# Patient Record
Sex: Female | Born: 1950
Health system: Southern US, Community
[De-identification: ages and names within clinical notes are randomized; demographics above are authoritative.]

## PROBLEM LIST (undated history)

## (undated) DIAGNOSIS — E785 Hyperlipidemia, unspecified: Secondary | ICD-10-CM

## (undated) DIAGNOSIS — M810 Age-related osteoporosis without current pathological fracture: Secondary | ICD-10-CM

## (undated) DIAGNOSIS — G2581 Restless legs syndrome: Secondary | ICD-10-CM

## (undated) DIAGNOSIS — K279 Peptic ulcer, site unspecified, unspecified as acute or chronic, without hemorrhage or perforation: Secondary | ICD-10-CM

## (undated) DIAGNOSIS — J309 Allergic rhinitis, unspecified: Secondary | ICD-10-CM

## (undated) DIAGNOSIS — I10 Essential (primary) hypertension: Secondary | ICD-10-CM

## (undated) DIAGNOSIS — E079 Disorder of thyroid, unspecified: Secondary | ICD-10-CM

## (undated) DIAGNOSIS — J449 Chronic obstructive pulmonary disease, unspecified: Secondary | ICD-10-CM

## (undated) DIAGNOSIS — R05 Cough: Secondary | ICD-10-CM

## (undated) DIAGNOSIS — J189 Pneumonia, unspecified organism: Secondary | ICD-10-CM

## (undated) DIAGNOSIS — R252 Cramp and spasm: Secondary | ICD-10-CM

## (undated) HISTORY — DX: Allergic rhinitis, unspecified: J30.9

## (undated) HISTORY — DX: Essential (primary) hypertension: I10

## (undated) HISTORY — DX: Hyperlipidemia, unspecified: E78.5

## (undated) HISTORY — PX: BREAST LUMPECTOMY: SHX2

## (undated) HISTORY — DX: Cough: R05

## (undated) HISTORY — DX: Chronic obstructive pulmonary disease, unspecified: J44.9

## (undated) HISTORY — DX: Disorder of thyroid, unspecified: E07.9

## (undated) HISTORY — DX: Cramp and spasm: R25.2

## (undated) HISTORY — DX: Age-related osteoporosis without current pathological fracture: M81.0

## (undated) HISTORY — PX: BREAST EXCISIONAL BIOPSY: SUR124

## (undated) HISTORY — DX: Pneumonia, unspecified organism: J18.9

## (undated) HISTORY — DX: Peptic ulcer, site unspecified, unspecified as acute or chronic, without hemorrhage or perforation: K27.9

## (undated) HISTORY — DX: Restless legs syndrome: G25.81

---
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
## 1999-03-08 ENCOUNTER — Encounter: Admission: RE | Admit: 1999-03-08 | Discharge: 1999-03-08 | Payer: Self-pay | Admitting: Obstetrics and Gynecology

## 1999-03-08 ENCOUNTER — Encounter: Payer: Self-pay | Admitting: Obstetrics and Gynecology

## 2000-08-05 ENCOUNTER — Encounter: Admission: RE | Admit: 2000-08-05 | Discharge: 2000-08-05 | Payer: Self-pay | Admitting: Obstetrics and Gynecology

## 2000-08-05 ENCOUNTER — Encounter: Payer: Self-pay | Admitting: Obstetrics and Gynecology

## 2001-11-23 ENCOUNTER — Encounter: Admission: RE | Admit: 2001-11-23 | Discharge: 2001-11-23 | Payer: Self-pay | Admitting: Obstetrics and Gynecology

## 2001-11-23 ENCOUNTER — Encounter: Payer: Self-pay | Admitting: Obstetrics and Gynecology

## 2003-01-11 ENCOUNTER — Emergency Department (HOSPITAL_COMMUNITY): Admission: EM | Admit: 2003-01-11 | Discharge: 2003-01-11 | Payer: Self-pay | Admitting: Emergency Medicine

## 2003-02-10 ENCOUNTER — Encounter: Payer: Self-pay | Admitting: Obstetrics and Gynecology

## 2003-02-10 ENCOUNTER — Encounter: Admission: RE | Admit: 2003-02-10 | Discharge: 2003-02-10 | Payer: Self-pay | Admitting: Obstetrics and Gynecology

## 2003-03-14 ENCOUNTER — Emergency Department (HOSPITAL_COMMUNITY): Admission: AD | Admit: 2003-03-14 | Discharge: 2003-03-14 | Payer: Self-pay | Admitting: Family Medicine

## 2004-02-27 ENCOUNTER — Encounter: Admission: RE | Admit: 2004-02-27 | Discharge: 2004-02-27 | Payer: Self-pay | Admitting: Internal Medicine

## 2004-02-27 ENCOUNTER — Ambulatory Visit: Payer: Self-pay | Admitting: Internal Medicine

## 2004-05-25 ENCOUNTER — Encounter: Payer: Self-pay | Admitting: Internal Medicine

## 2004-06-04 ENCOUNTER — Other Ambulatory Visit: Admission: RE | Admit: 2004-06-04 | Discharge: 2004-06-04 | Payer: Self-pay | Admitting: Internal Medicine

## 2004-06-04 ENCOUNTER — Ambulatory Visit: Payer: Self-pay | Admitting: Internal Medicine

## 2004-09-03 ENCOUNTER — Ambulatory Visit: Payer: Self-pay | Admitting: Internal Medicine

## 2004-12-11 ENCOUNTER — Emergency Department (HOSPITAL_COMMUNITY): Admission: EM | Admit: 2004-12-11 | Discharge: 2004-12-11 | Payer: Self-pay | Admitting: Emergency Medicine

## 2005-04-18 ENCOUNTER — Encounter: Admission: RE | Admit: 2005-04-18 | Discharge: 2005-04-18 | Payer: Self-pay | Admitting: Internal Medicine

## 2005-06-10 ENCOUNTER — Ambulatory Visit: Payer: Self-pay | Admitting: Internal Medicine

## 2005-06-13 ENCOUNTER — Ambulatory Visit: Payer: Self-pay | Admitting: Internal Medicine

## 2006-05-16 ENCOUNTER — Encounter: Admission: RE | Admit: 2006-05-16 | Discharge: 2006-05-16 | Payer: Self-pay | Admitting: Internal Medicine

## 2006-05-29 ENCOUNTER — Encounter: Admission: RE | Admit: 2006-05-29 | Discharge: 2006-05-29 | Payer: Self-pay | Admitting: Internal Medicine

## 2006-06-12 ENCOUNTER — Ambulatory Visit: Payer: Self-pay | Admitting: Internal Medicine

## 2006-06-12 LAB — CONVERTED CEMR LAB
AST: 27 units/L (ref 0–37)
Albumin: 4.2 g/dL (ref 3.5–5.2)
Alkaline Phosphatase: 51 units/L (ref 39–117)
Basophils Absolute: 0 10*3/uL (ref 0.0–0.1)
Basophils Relative: 0.2 % (ref 0.0–1.0)
Creatinine, Ser: 1.3 mg/dL — ABNORMAL HIGH (ref 0.4–1.2)
Crystals: NEGATIVE
Eosinophils Relative: 5.1 % — ABNORMAL HIGH (ref 0.0–5.0)
GFR calc Af Amer: 55 mL/min
GFR calc non Af Amer: 45 mL/min
Glucose, Bld: 92 mg/dL (ref 70–99)
HCT: 41.9 % (ref 36.0–46.0)
HDL: 63.6 mg/dL (ref >39.0)
Hemoglobin: 14.5 g/dL (ref 12.0–15.0)
Ketones, ur: NEGATIVE mg/dL
Lymphocytes Relative: 37.1 % (ref 12.0–46.0)
Neutro Abs: 1.7 10*3/uL (ref 1.4–7.7)
Neutrophils Relative %: 50.4 % (ref 43.0–77.0)
Platelets: 290 10*3/uL (ref 150–400)
Potassium: 3.8 meq/L (ref 3.5–5.1)
RDW: 12 % (ref 11.5–14.6)
Sodium: 140 meq/L (ref 135–145)
TSH: 1.05 microintl units/mL (ref 0.35–5.50)
Total CHOL/HDL Ratio: 3.1
Total Protein: 8.1 g/dL (ref 6.0–8.3)
Triglycerides: 81 mg/dL (ref 0–149)
VLDL: 16 mg/dL (ref 0–40)
WBC: 3.5 10*3/uL — ABNORMAL LOW (ref 4.5–10.5)
pH: 6 (ref 5.0–8.0)

## 2006-06-19 ENCOUNTER — Emergency Department (HOSPITAL_COMMUNITY): Admission: EM | Admit: 2006-06-19 | Discharge: 2006-06-19 | Payer: Self-pay | Admitting: Family Medicine

## 2006-06-20 ENCOUNTER — Ambulatory Visit: Payer: Self-pay | Admitting: Internal Medicine

## 2006-06-25 ENCOUNTER — Ambulatory Visit: Payer: Self-pay | Admitting: Internal Medicine

## 2006-08-04 ENCOUNTER — Ambulatory Visit: Payer: Self-pay | Admitting: Internal Medicine

## 2006-08-18 ENCOUNTER — Ambulatory Visit: Payer: Self-pay | Admitting: Internal Medicine

## 2006-08-18 LAB — HM COLONOSCOPY

## 2007-02-04 ENCOUNTER — Telehealth: Payer: Self-pay | Admitting: Internal Medicine

## 2007-02-10 ENCOUNTER — Encounter: Payer: Self-pay | Admitting: Internal Medicine

## 2007-02-10 DIAGNOSIS — M810 Age-related osteoporosis without current pathological fracture: Secondary | ICD-10-CM

## 2007-02-10 DIAGNOSIS — I10 Essential (primary) hypertension: Secondary | ICD-10-CM | POA: Insufficient documentation

## 2007-02-10 HISTORY — DX: Age-related osteoporosis without current pathological fracture: M81.0

## 2007-02-10 HISTORY — DX: Essential (primary) hypertension: I10

## 2007-06-02 ENCOUNTER — Encounter: Admission: RE | Admit: 2007-06-02 | Discharge: 2007-06-02 | Payer: Self-pay | Admitting: Internal Medicine

## 2007-06-25 ENCOUNTER — Telehealth: Payer: Self-pay | Admitting: Internal Medicine

## 2007-06-26 ENCOUNTER — Encounter: Payer: Self-pay | Admitting: Internal Medicine

## 2007-07-13 ENCOUNTER — Telehealth: Payer: Self-pay | Admitting: Internal Medicine

## 2007-09-04 ENCOUNTER — Telehealth (INDEPENDENT_AMBULATORY_CARE_PROVIDER_SITE_OTHER): Payer: Self-pay | Admitting: *Deleted

## 2007-09-20 ENCOUNTER — Emergency Department (HOSPITAL_COMMUNITY): Admission: EM | Admit: 2007-09-20 | Discharge: 2007-09-20 | Payer: Self-pay | Admitting: Family Medicine

## 2007-11-05 ENCOUNTER — Ambulatory Visit: Payer: Self-pay | Admitting: Endocrinology

## 2007-11-05 ENCOUNTER — Telehealth (INDEPENDENT_AMBULATORY_CARE_PROVIDER_SITE_OTHER): Payer: Self-pay | Admitting: *Deleted

## 2007-11-05 DIAGNOSIS — R059 Cough, unspecified: Secondary | ICD-10-CM

## 2007-11-05 DIAGNOSIS — R05 Cough: Secondary | ICD-10-CM

## 2007-11-05 HISTORY — DX: Cough, unspecified: R05.9

## 2007-11-06 LAB — CONVERTED CEMR LAB
Creatinine, Ser: 1.2 mg/dL (ref 0.4–1.2)
Glucose, Bld: 96 mg/dL (ref 70–99)
Potassium: 3.2 meq/L — ABNORMAL LOW (ref 3.5–5.1)

## 2007-11-16 ENCOUNTER — Ambulatory Visit: Payer: Self-pay | Admitting: Cardiology

## 2007-11-16 DIAGNOSIS — E079 Disorder of thyroid, unspecified: Secondary | ICD-10-CM | POA: Insufficient documentation

## 2007-11-16 HISTORY — DX: Disorder of thyroid, unspecified: E07.9

## 2007-11-24 ENCOUNTER — Encounter: Admission: RE | Admit: 2007-11-24 | Discharge: 2007-11-24 | Payer: Self-pay | Admitting: Endocrinology

## 2008-03-14 ENCOUNTER — Ambulatory Visit: Payer: Self-pay | Admitting: Internal Medicine

## 2008-03-14 ENCOUNTER — Ambulatory Visit: Payer: Self-pay | Admitting: Cardiovascular Disease

## 2008-03-14 ENCOUNTER — Inpatient Hospital Stay (HOSPITAL_COMMUNITY): Admission: EM | Admit: 2008-03-14 | Discharge: 2008-03-16 | Payer: Self-pay | Admitting: Emergency Medicine

## 2008-03-15 ENCOUNTER — Encounter: Payer: Self-pay | Admitting: Internal Medicine

## 2008-03-23 ENCOUNTER — Ambulatory Visit: Payer: Self-pay | Admitting: Internal Medicine

## 2008-03-23 ENCOUNTER — Telehealth (INDEPENDENT_AMBULATORY_CARE_PROVIDER_SITE_OTHER): Payer: Self-pay | Admitting: *Deleted

## 2008-03-23 DIAGNOSIS — K279 Peptic ulcer, site unspecified, unspecified as acute or chronic, without hemorrhage or perforation: Secondary | ICD-10-CM

## 2008-03-23 DIAGNOSIS — G2581 Restless legs syndrome: Secondary | ICD-10-CM

## 2008-03-23 DIAGNOSIS — J189 Pneumonia, unspecified organism: Secondary | ICD-10-CM

## 2008-03-23 DIAGNOSIS — J449 Chronic obstructive pulmonary disease, unspecified: Secondary | ICD-10-CM

## 2008-03-23 DIAGNOSIS — J4489 Other specified chronic obstructive pulmonary disease: Secondary | ICD-10-CM

## 2008-03-23 HISTORY — DX: Other specified chronic obstructive pulmonary disease: J44.89

## 2008-03-23 HISTORY — DX: Chronic obstructive pulmonary disease, unspecified: J44.9

## 2008-03-23 HISTORY — DX: Pneumonia, unspecified organism: J18.9

## 2008-03-23 HISTORY — DX: Peptic ulcer, site unspecified, unspecified as acute or chronic, without hemorrhage or perforation: K27.9

## 2008-03-23 HISTORY — DX: Restless legs syndrome: G25.81

## 2008-04-12 ENCOUNTER — Telehealth: Payer: Self-pay | Admitting: Internal Medicine

## 2008-06-02 ENCOUNTER — Encounter: Admission: RE | Admit: 2008-06-02 | Discharge: 2008-06-02 | Payer: Self-pay | Admitting: Internal Medicine

## 2008-07-01 ENCOUNTER — Telehealth: Payer: Self-pay | Admitting: Internal Medicine

## 2008-07-21 ENCOUNTER — Encounter: Payer: Self-pay | Admitting: Internal Medicine

## 2008-07-21 ENCOUNTER — Ambulatory Visit: Payer: Self-pay | Admitting: Internal Medicine

## 2008-11-02 ENCOUNTER — Ambulatory Visit: Payer: Self-pay | Admitting: Internal Medicine

## 2008-12-14 ENCOUNTER — Telehealth: Payer: Self-pay | Admitting: Internal Medicine

## 2008-12-15 ENCOUNTER — Telehealth: Payer: Self-pay | Admitting: Internal Medicine

## 2009-06-08 ENCOUNTER — Encounter: Admission: RE | Admit: 2009-06-08 | Discharge: 2009-06-08 | Payer: Self-pay | Admitting: Internal Medicine

## 2009-06-08 LAB — HM MAMMOGRAPHY

## 2009-08-09 ENCOUNTER — Ambulatory Visit: Payer: Self-pay | Admitting: Internal Medicine

## 2009-08-09 DIAGNOSIS — R252 Cramp and spasm: Secondary | ICD-10-CM

## 2009-08-09 HISTORY — DX: Cramp and spasm: R25.2

## 2009-08-09 LAB — CONVERTED CEMR LAB
ALT: 16 units/L (ref 0–35)
BUN: 37 mg/dL — ABNORMAL HIGH (ref 6–23)
Basophils Absolute: 0 10*3/uL (ref 0.0–0.1)
Basophils Relative: 1.4 % (ref 0.0–3.0)
Creatinine, Ser: 1.3 mg/dL — ABNORMAL HIGH (ref 0.4–1.2)
Direct LDL: 113 mg/dL
GFR calc non Af Amer: 54.11 mL/min (ref >60)
HCT: 36.6 % (ref 36.0–46.0)
Lymphs Abs: 0.7 10*3/uL (ref 0.7–4.0)
MCHC: 35 g/dL (ref 30.0–36.0)
Magnesium: 1.9 mg/dL (ref 1.5–2.5)
Monocytes Relative: 11 % (ref 3.0–12.0)
Neutrophils Relative %: 53.5 % (ref 43.0–77.0)
Nitrite: NEGATIVE
Platelets: 285 10*3/uL (ref 150.0–400.0)
Potassium: 4.5 meq/L (ref 3.5–5.1)
Sodium: 140 meq/L (ref 135–145)
TSH: 0.82 microintl units/mL (ref 0.35–5.50)
Total Bilirubin: 0.8 mg/dL (ref 0.3–1.2)
Total Protein, Urine: NEGATIVE mg/dL
Urine Glucose: NEGATIVE mg/dL
VLDL: 11.8 mg/dL (ref 0.0–40.0)
WBC: 2.7 10*3/uL — ABNORMAL LOW (ref 4.5–10.5)
pH: 5.5 (ref 5.0–8.0)

## 2009-11-15 ENCOUNTER — Emergency Department (HOSPITAL_COMMUNITY): Admission: EM | Admit: 2009-11-15 | Discharge: 2009-11-15 | Payer: Self-pay | Admitting: Emergency Medicine

## 2010-05-12 ENCOUNTER — Other Ambulatory Visit: Payer: Self-pay | Admitting: Internal Medicine

## 2010-05-12 DIAGNOSIS — Z1239 Encounter for other screening for malignant neoplasm of breast: Secondary | ICD-10-CM

## 2010-05-13 ENCOUNTER — Encounter: Payer: Self-pay | Admitting: Internal Medicine

## 2010-05-14 ENCOUNTER — Encounter: Payer: Self-pay | Admitting: Endocrinology

## 2010-05-22 NOTE — Assessment & Plan Note (Signed)
Summary: CRAMPS---STC   Vital Signs:  Patient profile:   60 year old female Height:      65 inches Weight:      111 pounds BMI:     18.54 O2 Sat:      97 % on Room air Temp:     97.6 degrees F oral Pulse rate:   81 / minute BP sitting:   120 / 84  (left arm) Cuff size:   regular  Vitals Entered ByZella Ball Ewing (August 09, 2009 10:27 AM)  O2 Flow:  Room air  Preventive Care Screening  Bone Density:    Date:  06/29/2009    Next Due:  06/2011    Results:  abnormal std dev  Mammogram:    Next Due:  06/2010  Last Flu Shot:    Date:  01/20/2009    Results:  given      declines shots for today  CC: Leg cramps/RE   CC:  Leg cramps/RE.  History of Present Illness: pt here to f/u; incidently on doxy per dental due to gum dz;  has very active physical job with cramps to post legs as well as arms and mild tenderness to the elbows;  also requests increase in the advair strength due to several weeks breakthrough symptoms or mild wheezing/sob;  Pt denies CP, orthopnea, pnd, worsening LE edema, palps, dizziness or syncope .  Pt denies new neuro symptoms such as headache, facial or extremity weakness   Preventive Screening-Counseling & Management  Alcohol-Tobacco     Smoking Status: quit      Drug Use:  no.    Problems Prior to Update: 1)  Preventive Health Care  (ICD-V70.0) 2)  Muscle Cramps  (ICD-729.82) 3)  Peptic Ulcer Disease  (ICD-533.90) 4)  Restless Leg Syndrome  (ICD-333.94) 5)  COPD  (ICD-496) 6)  Pneumonia, Right Upper Lobe  (ICD-486) 7)  Unspecified Disorder of Thyroid  (ICD-246.9) 8)  Encounter For Long-term Use of Other Medications  (ICD-V58.69) 9)  Cough  (ICD-786.2) 10)  Osteoporosis  (ICD-733.00) 11)  Hypertension  (ICD-401.9)  Medications Prior to Update: 1)  Triamterene-Hctz 37.5-25 Mg  Caps (Triamterene-Hctz) .Marland Kitchen.. 1 By Mouth Once Daily ----- [facility] Member # [id number] 2)  Tylenol Extra Strength 500 Mg  Tabs (Acetaminophen) .... As Needed 3)   Fosamax Plus D 70-2800 Mg-Unit  Tabs (Alendronate-Cholecalciferol) .Marland Kitchen.. 1 By Mouth Q Wk 4)  Ventolin Hfa 108 (90 Base) Mcg/act Aers (Albuterol Sulfate) 5)  Advair Diskus 100-50 Mcg/dose Misc (Fluticasone-Salmeterol) .Marland Kitchen.. 1 Puff Two Times A Day  Current Medications (verified): 1)  Triamterene-Hctz 37.5-25 Mg  Caps (Triamterene-Hctz) .Marland Kitchen.. 1 By Mouth Once Daily ----- [facility] Member # [id number] 2)  Tylenol Extra Strength 500 Mg  Tabs (Acetaminophen) .... As Needed 3)  Fosamax Plus D 70-2800 Mg-Unit  Tabs (Alendronate-Cholecalciferol) .Marland Kitchen.. 1 By Mouth Q Wk 4)  Ventolin Hfa 108 (90 Base) Mcg/act Aers (Albuterol Sulfate) .... 2 Puffs Qid As Needed 5)  Advair Diskus 250-50 Mcg/dose Aepb (Fluticasone-Salmeterol) .Marland Kitchen.. 1 Puff Two Times A Day  Allergies (verified): 1)  ! Zoloft  Past History:  Past Medical History: Last updated: 2008/04/21 OSTEOPOROSIS (ICD-733.00) - with early menopause at 60yo, on HRT for 10 yrs to 2004 HYPERTENSION (ICD-401.9) COPD RLS Peptic ulcer disease s/p knee fracture 1998  Past Surgical History: Last updated: 04/21/08 Breast bx (negative) - 1972  Family History: Last updated: 04/21/2008 brother died with throat cancer brother died with renal failure father with leukemia mother with MI at 59yo/smoker  Social History: Last updated: 08/09/2009 Alcohol use-yes Married 2 children work - Public affairs consultant Former Smoker - since 2009 Drug use-no  Risk Factors: Smoking Status: quit (08/09/2009)  Family History: Reviewed history from 03/23/2008 and no changes required. brother died with throat cancer brother died with renal failure father with leukemia mother with MI at 59yo/smoker  Social History: Reviewed history from 03/23/2008 and no changes required. 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         all otherwise negative per pt -    Physical Exam  General:  alert and underweight appearing.   Head:  normocephalic and atraumatic.   Eyes:  vision grossly intact, pupils equal, and pupils round.   Ears:  R ear normal and L ear normal.   Nose:  no external deformity and no nasal discharge.   Mouth:  no gingival abnormalities and pharynx pink and moist.   Neck:  supple and no masses.   Lungs:  normal respiratory effort and normal breath sounds.   Heart:  normal rate and regular rhythm.   Abdomen:  soft, non-tender, and normal bowel sounds.   Msk:  no joint tenderness and no joint swelling except for bilat lateral epicondylar areas; no muscular tender to palpation such as thigh or calves Extremities:  no edema, no erythema  Neurologic:  cranial nerves II-XII intact and strength normal in all extremities.   Skin:  color normal and no rashes.     Impression & Recommendations:  Problem # 1:  Preventive Health Care (ICD-V70.0)  Overall doing well, age appropriate education and counseling updated and referral for appropriate preventive services done unless declined, immunizations up to date or declined, diet counseling done if overweight, urged to quit smoking if smokes , most recent labs reviewed and current ordered if appropriate, ecg reviewed or declined (interpretation per ECG scanned in the EMR if done); information regarding Medicare Prevention requirements given if appropriate   Orders: T-Vitamin D (25-Hydroxy) (46962-95284) TLB-BMP (Basic Metabolic Panel-BMET) (80048-METABOL) TLB-CBC  Platelet - w/Differential (85025-CBCD) TLB-Hepatic/Liver Function Pnl (80076-HEPATIC) TLB-Lipid Panel (80061-LIPID) TLB-TSH (Thyroid Stimulating Hormone) (84443-TSH) TLB-Udip ONLY (81003-UDIP)  Problem # 2:  OSTEOPOROSIS (ICD-733.00)  Her updated medication list for this problem includes:    Fosamax Plus D 70-2800 Mg-unit Tabs (Alendronate-cholecalciferol) .Marland Kitchen... 1 by mouth q wk stable overall by hx and exam, ok to continue meds/tx as is , most recent dxa d/w pt  Problem # 3:  COPD (ICD-496)  Her updated medication list for this problem includes:    Ventolin Hfa 108 (90 Base) Mcg/act Aers (Albuterol sulfate) .Marland Kitchen... 2 puffs qid as needed    Advair Diskus 250-50 Mcg/dose Aepb (Fluticasone-salmeterol) .Marland Kitchen... 1 puff two times a day meds increased as above;  f/u any worsening symptoms , urged to quit smoking  Problem # 4:  HYPERTENSION (ICD-401.9)  Her updated medication list for this problem includes:    Triamterene-hctz 37.5-25 Mg Caps (Triamterene-hctz) .Marland Kitchen... 1 by mouth once daily ----- [facility] member # [id number]  BP today: 120/84 Prior BP: 132/88 (11/02/2008)  Labs Reviewed: K+: 3.2 (11/05/2007) Creat: : 1.2 (11/05/2007)  Chol: 194 (06/12/2006)   HDL: 63.6 (06/12/2006)   LDL: 114 (06/12/2006)   TG: 81 (06/12/2006) stable overall by hx and exam, ok to continue meds/tx as is   Problem # 5:  MUSCLE CRAMPS (ICD-729.82)  likely due to overexertion - try B complex MVI, and check Mg in addition to labs above  Orders: TLB-Magnesium (Mg) (83735-MG)  Complete Medication List: 1)  Triamterene-hctz 37.5-25 Mg Caps (Triamterene-hctz) .Marland Kitchen.. 1 by mouth once daily ----- [facility] member # [id number] 2)  Tylenol Extra Strength 500 Mg Tabs (Acetaminophen) .... As needed 3)  Fosamax Plus D 70-2800 Mg-unit Tabs (Alendronate-cholecalciferol) .Marland Kitchen.. 1 by mouth q wk 4)  Ventolin Hfa 108 (90 Base) Mcg/act Aers (Albuterol sulfate) .... 2 puffs qid as needed 5)  Advair Diskus 250-50 Mcg/dose Aepb  (Fluticasone-salmeterol) .Marland Kitchen.. 1 puff two times a day  Other Orders: Pneumococcal Vaccine (78295) Admin 1st Vaccine (62130)  Patient Instructions: 1)  consider B complex mutlivitiamin - 1 per day for the cramps 2)  your advair was increased to the 250/50 today 3)  you had the pneumonia shot today 4)  Please go to the Lab in the basement for your blood and/or urine tests today 5)  Continue all previous medications as before this visit  6)  Please schedule a follow-up appointment in 1 year or sooner if needed Prescriptions: VENTOLIN HFA 108 (90 BASE) MCG/ACT AERS (ALBUTEROL SULFATE) 2 puffs qid as needed  #1 x 11   Entered and Authorized by:   Corwin Levins MD   Signed by:   Corwin Levins MD on 08/09/2009   Method used:   Print then Give to Patient   RxID:   [id number] ADVAIR DISKUS 250-50 MCG/DOSE AEPB (FLUTICASONE-SALMETEROL) 1 puff two times a day  #3 x 3   Entered and Authorized by:   Corwin Levins MD   Signed by:   Corwin Levins MD on 08/09/2009   Method used:   Print then Give to Patient   RxID:   [id number] FOSAMAX PLUS D 70-2800 MG-UNIT  TABS (ALENDRONATE-CHOLECALCIFEROL) 1 by mouth q wk  #12 x 3   Entered and Authorized by:   Corwin Levins MD   Signed by:   Corwin Levins MD on 08/09/2009   Method used:   Print then Give to Patient   RxID:   [id number] TRIAMTERENE-HCTZ 37.5-25 MG  CAPS (TRIAMTERENE-HCTZ) 1 by mouth once daily ----- [facility] MEMBER # [id number]  #90 x 3   Entered and Authorized by:   Corwin Levins MD   Signed by:   Corwin Levins MD on 08/09/2009   Method used:   Print then Give to Patient   RxID:   [id number] ADVAIR DISKUS 250-50 MCG/DOSE AEPB (FLUTICASONE-SALMETEROL) 1 puff two times a day  #1 x 11   Entered and Authorized by:   Corwin Levins MD   Signed by:   Corwin Levins MD on 08/09/2009   Method used:   Electronically to        [facility]  [phone]* (retail)       [address]       [place],  [place]  [id number]       Ph: [phone] or [phone]       Fax: [phone]   RxID:   [id number]    Immunizations Administered:  Pneumonia Vaccine:    Vaccine Type: Pneumovax    Site: left  deltoid    Mfr: Merck    Dose: 0.5 ml    Route: IM    Given by: Robin Ewing    Exp. Date: 12/02/2010    Lot #: [id number]    VIS given: 11/18/95 version given August 09, 2009.

## 2010-06-12 ENCOUNTER — Ambulatory Visit
Admission: RE | Admit: 2010-06-12 | Discharge: 2010-06-12 | Disposition: A | Discharge status: Home / Self Care | Payer: 59 | Source: Ambulatory Visit | Attending: Internal Medicine | Admitting: Internal Medicine

## 2010-06-12 DIAGNOSIS — Z1239 Encounter for other screening for malignant neoplasm of breast: Secondary | ICD-10-CM

## 2010-07-07 LAB — CBC
HCT: 40.6 % (ref 36.0–46.0)
MCHC: 34.8 g/dL (ref 30.0–36.0)
Platelets: 290 10*3/uL (ref 150–400)
WBC: 3.6 10*3/uL — ABNORMAL LOW (ref 4.0–10.5)

## 2010-07-07 LAB — COMPREHENSIVE METABOLIC PANEL
ALT: 17 U/L (ref 0–35)
BUN: 30 mg/dL — ABNORMAL HIGH (ref 6–23)
CO2: 33 mEq/L — ABNORMAL HIGH (ref 19–32)
GFR calc Af Amer: 49 mL/min — ABNORMAL LOW (ref >60)
GFR calc non Af Amer: 40 mL/min — ABNORMAL LOW (ref >60)
Glucose, Bld: 113 mg/dL — ABNORMAL HIGH (ref 70–99)
Total Protein: 8.9 g/dL — ABNORMAL HIGH (ref 6.0–8.3)

## 2010-07-07 LAB — URINE MICROSCOPIC-ADD ON

## 2010-07-07 LAB — DIFFERENTIAL
Basophils Absolute: 0 10*3/uL (ref 0.0–0.1)
Basophils Relative: 1 % (ref 0–1)
Eosinophils Absolute: 0 10*3/uL (ref 0.0–0.7)
Monocytes Absolute: 0.3 10*3/uL (ref 0.1–1.0)
Neutro Abs: 2.4 10*3/uL (ref 1.7–7.7)
Neutrophils Relative %: 65 % (ref 43–77)

## 2010-07-07 LAB — URINALYSIS, ROUTINE W REFLEX MICROSCOPIC
Glucose, UA: NEGATIVE mg/dL
Hgb urine dipstick: NEGATIVE
Nitrite: NEGATIVE
Protein, ur: NEGATIVE mg/dL
Specific Gravity, Urine: 1.018 (ref 1.005–1.030)

## 2010-07-07 LAB — LIPASE, BLOOD: Lipase: 34 U/L (ref 11–59)

## 2010-07-07 LAB — URINE CULTURE

## 2010-09-04 NOTE — Discharge Summary (Signed)
[name], [name]              ACCOUNT NO.:  [id number]   MEDICAL RECORD NO.:  [id number]          PATIENT TYPE:  INP   LOCATION:  2007                         FACILITY:  [facility]   PHYSICIAN:  Raenette Rover. Felicity Coyer, MDDATE OF BIRTH:  13-Mar-1951   DATE OF ADMISSION:  03/14/2008  DATE OF DISCHARGE:  03/16/2008                               DISCHARGE SUMMARY   DISCHARGE DIAGNOSES:  1. Acute chronic obstructive pulmonary disease exacerbation.  2. Hypertension.  3. Hypokalemia.  4. Ongoing tobacco abuse.  5. Elevated AST on admission, resolved.  6. Restless legs syndrome.   HISTORY OF PRESENT ILLNESS:  Andrea [name] is a 60 year old female who was  admitted on March 14, 2008, with a chief complaint of shortness of  breath.  She has a past medical history of tobacco abuse and  hypertension.  She was at her baseline until the Wednesday prior to  admission, at which time she developed chills and myalgias as well as  increased shortness of breath and cough.  Her symptoms continued to  worsen and she began to wheeze.  She was admitted for further evaluation  and treatment.   PAST MEDICAL HISTORY:  1. Osteoporosis.  2. Hypertension.   COURSE OF HOSPITALIZATION:  1. Acute COPD exacerbation.  The patient is currently smoking two      packs of cigarettes per day.  She underwent a chest x-ray on      admission, which showed emphysematous changes.  It did not show any      acute infiltrate.  She was placed on empiric Avelox and oxygen.      Her cardiac enzymes were cycled and were within normal limits.  She      was also given nebulizer treatment.  She is currently stable and      improved.  She continues with dry cough and will be given a      prescription for Tussionex at the time of discharge.  She is      motivated, quit smoking, and wishes to use Chantix.  A prescription      will be provided at the time of discharge.  She did have some brief      hypokalemia, which was repleted.  Her  blood pressure medications      have been held.  Her blood pressure was on the low side, today it      is 114/70.  We will continue to hold her blood pressure medicine      until followup with Dr. Jonny Ruiz early next week.  Also, of note, she      had a mildly positive urinalysis.  Plan to continue fluoroquinolone      for COPD exacerbation, which should also cover any underlying      urinary tract infection.  Her BMP was normal and her 2D echo      performed on this admission showed a normal left ventricular      ejection fraction of 60%.  2. Restless legs syndrome.  The patient reports a history of many      years of restless legs and  insomnia.  Requested to try something to      see if it would help.  She was given ReQuip last night with good      result.  This would be continued at the time of discharge.   DISCHARGE MEDICATIONS:  1. Tylenol 500 mg p.o. q.8 h. p.r.n.  2. Fosamax plus D 70/280 one tab p.o. once weekly.  3. Albuterol MDI two puffs every 6 hours as needed.  4. Triamterene/hydrochlorothiazide 37.5/25 p.o. daily, to be held      until followup with Dr. Jonny Ruiz.  5. Tussionex 5 mL p.o. q.12 h. as needed.  6. ReQuip 1 mg p.o. nightly.  7. Avelox 400 mg p.o. daily for 5 days.  8. Chantix 0.5 mg tabs one tab p.o. daily for three days and one tab      p.o. b.i.d.   PERTINENT LABORATORY DATA:  At the time of discharge, cardiac enzymes  negative.  ESR 42, BUN 18, and creatinine 1.32.  Blood cultures remain  no growth-to-date x2.  Potassium 3.3 prior to repletion.   FOLLOWUP:  The patient is scheduled to follow up with Dr. Oliver Barre on  March 23, 2008, at 9 o'clock a.m.  She is instructed to call Dr. Jonny Ruiz  should she develop fever greater than 101, increased weakness, or  increased shortness of breath.  She is instructed to go to the ER if  severe.  Greater than 30 minutes was spent on discharge planning.      Sandford Craze, NP      Raenette Rover. Felicity Coyer, MD   Electronically Signed    MO/MEDQ  D:  03/16/2008  T:  03/16/2008  Job:  [id number]   cc:   Corwin Levins, MD

## 2010-09-04 NOTE — H&P (Signed)
NAMEJACKELIN, Andrea [name]              ACCOUNT NO.:  [id number]   MEDICAL RECORD NO.:  [id number]          PATIENT TYPE:  INP   LOCATION:  2007                         FACILITY:  [facility]   PHYSICIAN:  Michiel Cowboy, MDDATE OF BIRTH:  [birth year]   DATE OF ADMISSION:  03/14/2008  DATE OF DISCHARGE:                              HISTORY & PHYSICAL   PRIMARY CARE PHYSICIAN:  Corwin Levins, MD   CHIEF COMPLAINT:  Shortness of breath.   HISTORY OF PRESENT ILLNESS:  The patient is a 60 year old female with  history of hypothyroidism and tobacco abuse and hypertension who was at  her baseline up until Wednesday when she developed chills, myalgias and  started to feel some shortness of breath with coughing.  Nonproductive  and mild headache.  She continued to worsen, started to wheeze which is  not regular for her.  She never carried a diagnosis of COPD and  emphysema despite extensive smoking history.  The patient progressively  continued to be short of breath and presented to the emergency  department where a chest x-ray was obtained showing COPD.  The patient  received repeated nebulizer treatments with mild improvement in  shortness of breath, but still was sating 92% on 2 liters which is a new  for her at which point [facility] hospitalist was called to admission for  [place].   REVIEW OF SYSTEMS:  As per HPI, otherwise no nausea and vomiting.  Endorses 4 pound weight loss.  The patient was having some chest pain  with coughing, but otherwise, has resolved.   Of note, the patient had received a flu shot in October.   PAST MEDICAL HISTORY:  Significant for history of osteoporosis and  hypertension.   SOCIAL HISTORY:  The patient smokes two packs a day.  Does not use  drugs.  Endorses 4 pounds weight loss for the past few months.   FAMILY HISTORY:  Noncontributory.   PHYSICAL EXAMINATION:  VITALS:  Temperature 98.5, blood pressure 127/88  pulse 102, respirations 24, sating 92% on room  air.  The patient appears  to be in no acute distress, laying down in bed.  HEAD:  Nontraumatic.  Dry mucous membranes, somewhat decreased skin  turgor.  LUNGS:  Diffuse coarse breath sounds with occasional wheezes.  ABDOMEN:  Soft, nontender, nondistended, scaphoid.  HEART:  Rapid but regular.  No murmurs, rubs or gallops.  LOWER EXTREMITIES: Without clubbing, cyanosis or edema.  NEUROLOGICAL:  Exam nonfocal.  Able to move all four extremities without  difficulty.   LABORATORY DATA:  Labs white blood cell count 9.1, hemoglobin 13.9,  sodium 443, potassium 3.3, creatinine 0.124.  LFTs show a slight  elevation of AST up to 40, otherwise unremarkable.  UA shows 3-6 white  blood cells.  D-dimer unremarkable.  ABGs:  A pH of 7.512, pCO2 of 30.  PO2 of 142, bicarb 29.   Chest x-ray significant for COPD.  Otherwise, no abnormalities noted.  EKG showing heart rate of 88, normal sinus rhythm with right axis  deviation.  No ST elevation or depression noted.  There is a P-wave  inversion in lead I.   ASSESSMENT AND PLAN:  This is a 60 year old female with a history of  hypertension who presents with shortness of breath and wheezing.  She  has extensive smoking history.  1. Shortness of breath and wheezing.  I wonder if this is COPD      exacerbation, although, she does not carry a diagnosis of COPD.      The differential could include a CHF exacerbation, again less      likely, will check a BNP level though.  For completion's sake,      would include a 2-D echo.  For now will treat as a COPD with      putting her on Avelox given the albuterol and Atrovent scheduled      nebulizers.  Will do prednisone taper, encourage her to stop      smoking.  The patient may need to be started on Advair as an      outpatient.  Will need probably PFTs performed once she is more      stable.  PE less likely given negative D-dimer.  2. Hypokalemia.  Will replace.  3. Elevated AST.  Will follow.  If continues  to be elevated may need      to have further investigation such as right upper quadrant      ultrasound.  4. Clinical mild dehydration.  Will give IV fluids carefully.  5. Prophylaxis.  Protonix and Lovenox.  6. History of hypertension.  Will hold hydrochlorothiazide while      appears to be clinically somewhat dry.  7. History of recent febrile illness.  Could be viral in etiology      given this is the flu season.  Will continue droplet precautions,      check influenza swabs.  Will obtain blood culture.  Consider      repeating chest x-ray after IV fluids were given to rule out an      early pneumonia.   Dr. Rene Paci to assume care in the a.m.      Michiel Cowboy, MD  Electronically Signed     AVD/MEDQ  D:  03/14/2008  T:  03/15/2008  Job:  [id number]   cc:   Corwin Levins, MD  Raenette Rover Felicity Coyer, MD

## 2010-09-25 ENCOUNTER — Other Ambulatory Visit: Payer: Self-pay | Admitting: Internal Medicine

## 2010-11-07 ENCOUNTER — Other Ambulatory Visit: Payer: Self-pay | Admitting: Internal Medicine

## 2010-11-08 ENCOUNTER — Other Ambulatory Visit: Payer: Self-pay | Admitting: *Deleted

## 2010-11-08 MED ORDER — TRIAMTERENE-HCTZ 37.5-25 MG PO TABS: 1.0000 | ORAL_TABLET | Freq: Every day | ORAL | Status: DC

## 2010-11-08 NOTE — Progress Notes (Signed)
Addended by: Scharlene Gloss B on: 11/08/2010 11:57 AM   Modules accepted: Orders

## 2010-11-08 NOTE — Progress Notes (Signed)
Pt informed if OV 12/13/10 is canceled/no show that No future refill authorizations will be given.

## 2010-12-09 ENCOUNTER — Encounter: Payer: Self-pay | Admitting: Internal Medicine

## 2010-12-09 DIAGNOSIS — Z0001 Encounter for general adult medical examination with abnormal findings: Secondary | ICD-10-CM | POA: Insufficient documentation

## 2010-12-09 DIAGNOSIS — Z0000 Encounter for general adult medical examination without abnormal findings: Secondary | ICD-10-CM | POA: Insufficient documentation

## 2010-12-13 ENCOUNTER — Encounter: Payer: Self-pay | Admitting: Internal Medicine

## 2010-12-13 ENCOUNTER — Ambulatory Visit (INDEPENDENT_AMBULATORY_CARE_PROVIDER_SITE_OTHER): Payer: 59 | Admitting: Internal Medicine

## 2010-12-13 ENCOUNTER — Other Ambulatory Visit (INDEPENDENT_AMBULATORY_CARE_PROVIDER_SITE_OTHER): Payer: 59

## 2010-12-13 VITALS — BP 110/80 | HR 84 | Temp 97.7°F | Ht 65.0 in | Wt 112.4 lb

## 2010-12-13 DIAGNOSIS — Z0000 Encounter for general adult medical examination without abnormal findings: Secondary | ICD-10-CM

## 2010-12-13 LAB — URINALYSIS, ROUTINE W REFLEX MICROSCOPIC
Bilirubin Urine: NEGATIVE
Hgb urine dipstick: NEGATIVE
Total Protein, Urine: NEGATIVE
Urine Glucose: NEGATIVE

## 2010-12-13 LAB — LIPID PANEL
HDL: 81.1 mg/dL (ref >39.00)
Total CHOL/HDL Ratio: 2
Triglycerides: 77 mg/dL (ref 0.0–149.0)
VLDL: 15.4 mg/dL (ref 0.0–40.0)

## 2010-12-13 LAB — HEPATIC FUNCTION PANEL
Albumin: 4.4 g/dL (ref 3.5–5.2)
Alkaline Phosphatase: 51 U/L (ref 39–117)
Total Protein: 8.1 g/dL (ref 6.0–8.3)

## 2010-12-13 LAB — CBC WITH DIFFERENTIAL/PLATELET
Basophils Absolute: 0 10*3/uL (ref 0.0–0.1)
Basophils Relative: 0.6 % (ref 0.0–3.0)
Eosinophils Relative: 2.9 % (ref 0.0–5.0)
HCT: 39.5 % (ref 36.0–46.0)
Hemoglobin: 13.5 g/dL (ref 12.0–15.0)
Lymphocytes Relative: 29.2 % (ref 12.0–46.0)
Lymphs Abs: 1.2 10*3/uL (ref 0.7–4.0)
Monocytes Relative: 10.5 % (ref 3.0–12.0)
Neutro Abs: 2.3 10*3/uL (ref 1.4–7.7)
RBC: 3.99 Mil/uL (ref 3.87–5.11)
WBC: 4 10*3/uL — ABNORMAL LOW (ref 4.5–10.5)

## 2010-12-13 LAB — BASIC METABOLIC PANEL
CO2: 29 mEq/L (ref 19–32)
Calcium: 10.2 mg/dL (ref 8.4–10.5)
Creatinine, Ser: 1.3 mg/dL — ABNORMAL HIGH (ref 0.4–1.2)
GFR: 55.83 mL/min — ABNORMAL LOW (ref >60.00)
Glucose, Bld: 95 mg/dL (ref 70–99)
Sodium: 139 mEq/L (ref 135–145)

## 2010-12-13 MED ORDER — FLUTICASONE-SALMETEROL 250-50 MCG/DOSE IN AEPB: 1.0000 | INHALATION_SPRAY | Freq: Two times a day (BID) | RESPIRATORY_TRACT | Status: DC

## 2010-12-13 MED ORDER — TRIAMTERENE-HCTZ 37.5-25 MG PO TABS: 1.0000 | ORAL_TABLET | Freq: Every day | ORAL | Status: DC

## 2010-12-13 NOTE — Progress Notes (Signed)
Subjective:    Patient ID: Andrea [name], female    DOB: [birth year], 60 y.o.   MRN: [id number]  HPI  Here for wellness and f/u;  Overall doing ok;  Pt denies CP, worsening SOB, DOE, wheezing, orthopnea, PND, worsening LE edema, palpitations, dizziness or syncope.  Pt denies neurological change such as new Headache, facial or extremity weakness.  Pt denies polydipsia, polyuria, or low sugar symptoms. Pt states overall good compliance with treatment and medications, good tolerability, and trying to follow lower cholesterol diet.  Pt denies worsening depressive symptoms, suicidal ideation or panic. No fever, wt loss, night sweats, loss of appetite, or other constitutional symptoms.  Pt states good ability with ADL's, low fall risk, home safety reviewed and adequate, no significant changes in hearing or vision, and occasionally active with exercise.  Stopped smoking x 3 yrs. No other acute complaints.  Does not need the proventil, so not using.  Could not tolerate the fosamax so not using Past Medical History  Diagnosis Date  . COPD 03/23/2008  . Cough 11/05/2007  . HYPERTENSION 02/10/2007  . MUSCLE CRAMPS 08/09/2009  . OSTEOPOROSIS 02/10/2007  . PEPTIC ULCER DISEASE 03/23/2008  . PNEUMONIA, RIGHT UPPER LOBE 03/23/2008  . RESTLESS LEG SYNDROME 03/23/2008  . Unspecified disorder of thyroid 11/16/2007   Past Surgical History  Procedure Date  . Breast biopsy negative 1972    reports that she has quit smoking. She does not have any smokeless tobacco history on file. She reports that she drinks alcohol. She reports that she does not use illicit drugs. family history includes Leukemia in her father. Allergies  Allergen Reactions  . Sertraline Hcl    No current outpatient prescriptions on file prior to visit.   Review of Systems Review of Systems  Constitutional: Negative for diaphoresis, activity change, appetite change and unexpected weight change.  HENT: Negative for hearing loss, ear pain,  facial swelling, mouth sores and neck stiffness.   Eyes: Negative for pain, redness and visual disturbance.  Respiratory: Negative for shortness of breath and wheezing.   Cardiovascular: Negative for chest pain and palpitations.  Gastrointestinal: Negative for diarrhea, blood in stool, abdominal distention and rectal pain.  Genitourinary: Negative for hematuria, flank pain and decreased urine volume.  Musculoskeletal: Negative for myalgias and joint swelling.  Skin: Negative for color change and wound.  Neurological: Negative for syncope and numbness.  Hematological: Negative for adenopathy.  Psychiatric/Behavioral: Negative for hallucinations, self-injury, decreased concentration and agitation.         Objective:   Physical Exam BP 110/80  Pulse 84  Temp(Src) 97.7 F (36.5 C) (Oral)  Ht 5\' 5"  (1.651 m)  Wt 112 lb 6 oz (50.973 kg)  BMI 18.70 kg/m2  SpO2 97% Physical Exam  VS noted Constitutional: Pt is oriented to person, place, and time. Appears well-developed and well-nourished.  HENT:  Head: Normocephalic and atraumatic.  Right Ear: External ear normal.  Left Ear: External ear normal.  Nose: Nose normal.  Mouth/Throat: Oropharynx is clear and moist.  Eyes: Conjunctivae and EOM are normal. Pupils are equal, round, and reactive to light.  Neck: Normal range of motion. Neck supple. No JVD present. No tracheal deviation present.  Cardiovascular: Normal rate, regular rhythm, normal heart sounds and intact distal pulses.   Pulmonary/Chest: Effort normal and breath sounds normal.  Abdominal: Soft. Bowel sounds are normal. There is no tenderness.  Musculoskeletal: Normal range of motion. Exhibits no edema.  Lymphadenopathy:  Has no cervical adenopathy.  Neurological: Pt is  alert and oriented to person, place, and time. Pt has normal reflexes. No cranial nerve deficit.  Skin: Skin is warm and dry. No rash noted.  Psychiatric:  Has  normal mood and affect. Behavior is normal.       Assessment & Plan:

## 2010-12-13 NOTE — Assessment & Plan Note (Signed)

## 2010-12-13 NOTE — Patient Instructions (Addendum)
Please remember to followup with your GYN for the yearly pap smear and/or mammogram Your EKG was good today Your medication refills were sent to your pharmacy Please go to LAB in the Basement for the blood and/or urine tests to be done today Please call the phone number [phone] (the [employer]Tree System) for results of testing in 2-3 days;  When calling, simply dial the number, and when prompted enter the MRN number above (the Medical Record Number) and the # key, then the message should start. Please return in 1 year for your yearly visit, or sooner if needed, with Lab testing done 3-5 days before

## 2011-01-04 ENCOUNTER — Other Ambulatory Visit: Payer: Self-pay | Admitting: *Deleted

## 2011-01-04 MED ORDER — TRIAMTERENE-HCTZ 37.5-25 MG PO TABS: 1.0000 | ORAL_TABLET | Freq: Every day | ORAL | Status: DC

## 2011-01-04 NOTE — Telephone Encounter (Signed)
Pt wants refill of BP medication to be sent to [facility], pt informed rx sent via VM, left message to callback office with any questions/concerns.

## 2011-01-22 LAB — CK TOTAL AND CKMB (NOT AT ARMC)
CK, MB: 1.3 ng/mL (ref 0.3–4.0)
Relative Index: 0.4 (ref 0.0–2.5)
Total CK: 182 U/L — ABNORMAL HIGH (ref 7–177)
Total CK: 200 U/L — ABNORMAL HIGH (ref 7–177)
Total CK: 217 U/L — ABNORMAL HIGH (ref 7–177)

## 2011-01-22 LAB — COMPREHENSIVE METABOLIC PANEL
ALT: 16 U/L (ref 0–35)
AST: 40 U/L — ABNORMAL HIGH (ref 0–37)
Albumin: 3.9 g/dL (ref 3.5–5.2)
Alkaline Phosphatase: 56 U/L (ref 39–117)
BUN: 21 mg/dL (ref 6–23)
CO2: 21 mEq/L (ref 19–32)
Calcium: 8.6 mg/dL (ref 8.4–10.5)
Chloride: 104 mEq/L (ref 96–112)
Creatinine, Ser: 1.01 mg/dL (ref 0.4–1.2)
GFR calc Af Amer: >60 mL/min (ref >60)
GFR calc non Af Amer: 57 mL/min — ABNORMAL LOW (ref >60)
Glucose, Bld: 111 mg/dL — ABNORMAL HIGH (ref 70–99)
Potassium: 3.3 mEq/L — ABNORMAL LOW (ref 3.5–5.1)
Sodium: 143 mEq/L (ref 135–145)
Total Bilirubin: 0.5 mg/dL (ref 0.3–1.2)

## 2011-01-22 LAB — CBC
HCT: 41.5 % (ref 36.0–46.0)
MCHC: 34.8 g/dL (ref 30.0–36.0)
MCV: 99 fL (ref 78.0–100.0)
Platelets: 223 10*3/uL (ref 150–400)
Platelets: 245 10*3/uL (ref 150–400)
WBC: 6.7 10*3/uL (ref 4.0–10.5)
WBC: 9.1 10*3/uL (ref 4.0–10.5)

## 2011-01-22 LAB — DIFFERENTIAL
Basophils Absolute: 0 10*3/uL (ref 0.0–0.1)
Basophils Relative: 0 % (ref 0–1)
Eosinophils Absolute: 0 10*3/uL (ref 0.0–0.7)
Eosinophils Relative: 0 % (ref 0–5)
Monocytes Absolute: 0.5 10*3/uL (ref 0.1–1.0)
Monocytes Relative: 6 % (ref 3–12)
Neutro Abs: 7.4 10*3/uL (ref 1.7–7.7)

## 2011-01-22 LAB — INFLUENZA A & B ANTIBODIES
Influenza A Virus Ab, IgM: 5.09 IV
Influenza B ab, IgG: 1.26 IV

## 2011-01-22 LAB — URINALYSIS, ROUTINE W REFLEX MICROSCOPIC
Glucose, UA: NEGATIVE mg/dL
Hgb urine dipstick: NEGATIVE
Specific Gravity, Urine: 1.017 (ref 1.005–1.030)
pH: 7 (ref 5.0–8.0)

## 2011-01-22 LAB — LIPID PANEL
HDL: 52 mg/dL (ref >39)
Total CHOL/HDL Ratio: 2.5 RATIO
Triglycerides: 32 mg/dL (ref <150)
VLDL: 6 mg/dL (ref 0–40)

## 2011-01-22 LAB — CULTURE, BLOOD (ROUTINE X 2): Culture: NO GROWTH

## 2011-01-22 LAB — BASIC METABOLIC PANEL
BUN: 18 mg/dL (ref 6–23)
CO2: 23 mEq/L (ref 19–32)
Chloride: 109 mEq/L (ref 96–112)
Creatinine, Ser: 1.32 mg/dL — ABNORMAL HIGH (ref 0.4–1.2)
GFR calc Af Amer: 50 mL/min — ABNORMAL LOW (ref >60)

## 2011-01-22 LAB — URINE MICROSCOPIC-ADD ON

## 2011-01-22 LAB — POCT I-STAT 3, ART BLOOD GAS (G3+)
Acid-Base Excess: 2 mmol/L (ref 0.0–2.0)
Bicarbonate: 24.6 mEq/L — ABNORMAL HIGH (ref 20.0–24.0)
O2 Saturation: 99 %
Patient temperature: 37
TCO2: 25 mmol/L (ref 0–100)
pO2, Arterial: 142 mmHg — ABNORMAL HIGH (ref 80.0–100.0)

## 2011-01-22 LAB — POCT CARDIAC MARKERS
CKMB, poc: <1 ng/mL — ABNORMAL LOW (ref 1.0–8.0)
Troponin i, poc: <0.05 ng/mL (ref 0.00–0.09)
Troponin i, poc: <0.05 ng/mL (ref 0.00–0.09)

## 2011-01-22 LAB — SEDIMENTATION RATE: Sed Rate: 42 mm/hr — ABNORMAL HIGH (ref 0–22)

## 2011-02-16 ENCOUNTER — Emergency Department (HOSPITAL_COMMUNITY): Payer: 59

## 2011-02-16 ENCOUNTER — Emergency Department (HOSPITAL_COMMUNITY)
Admission: EM | Admit: 2011-02-16 | Discharge: 2011-02-16 | Disposition: A | Discharge status: Home / Self Care | Payer: 59 | Attending: Emergency Medicine | Admitting: Emergency Medicine

## 2011-02-16 DIAGNOSIS — I10 Essential (primary) hypertension: Secondary | ICD-10-CM | POA: Insufficient documentation

## 2011-02-16 DIAGNOSIS — R110 Nausea: Secondary | ICD-10-CM | POA: Insufficient documentation

## 2011-02-16 DIAGNOSIS — N390 Urinary tract infection, site not specified: Secondary | ICD-10-CM | POA: Insufficient documentation

## 2011-02-16 DIAGNOSIS — R350 Frequency of micturition: Secondary | ICD-10-CM | POA: Insufficient documentation

## 2011-02-16 DIAGNOSIS — R109 Unspecified abdominal pain: Secondary | ICD-10-CM | POA: Insufficient documentation

## 2011-02-16 LAB — CBC
MCV: 96 fL (ref 78.0–100.0)
Platelets: 252 10*3/uL (ref 150–400)
RBC: 3.96 MIL/uL (ref 3.87–5.11)
WBC: 9 10*3/uL (ref 4.0–10.5)

## 2011-02-16 LAB — DIFFERENTIAL
Basophils Relative: 0 % (ref 0–1)
Eosinophils Absolute: 0.1 10*3/uL (ref 0.0–0.7)
Lymphs Abs: 1.2 10*3/uL (ref 0.7–4.0)
Neutrophils Relative %: 81 % — ABNORMAL HIGH (ref 43–77)

## 2011-02-16 LAB — URINALYSIS, ROUTINE W REFLEX MICROSCOPIC
Glucose, UA: NEGATIVE mg/dL
Hgb urine dipstick: NEGATIVE
Protein, ur: NEGATIVE mg/dL

## 2011-02-16 LAB — HEPATIC FUNCTION PANEL
Bilirubin, Direct: 0.1 mg/dL (ref 0.0–0.3)
Indirect Bilirubin: 0.6 mg/dL (ref 0.3–0.9)

## 2011-02-16 LAB — URINE MICROSCOPIC-ADD ON

## 2011-02-17 LAB — URINE CULTURE

## 2011-02-21 ENCOUNTER — Other Ambulatory Visit: Payer: Self-pay | Admitting: *Deleted

## 2011-02-21 MED ORDER — FLUTICASONE-SALMETEROL 250-50 MCG/DOSE IN AEPB: 1.0000 | INHALATION_SPRAY | Freq: Two times a day (BID) | RESPIRATORY_TRACT | Status: DC

## 2011-02-21 NOTE — Telephone Encounter (Signed)
R'cd fax from [facility] for refill of Advair  Last OV-11/2010 Last filled-11/2010

## 2011-05-16 ENCOUNTER — Other Ambulatory Visit: Payer: Self-pay | Admitting: Internal Medicine

## 2011-05-16 DIAGNOSIS — Z1231 Encounter for screening mammogram for malignant neoplasm of breast: Secondary | ICD-10-CM

## 2011-06-14 ENCOUNTER — Ambulatory Visit
Admission: RE | Admit: 2011-06-14 | Discharge: 2011-06-14 | Disposition: A | Discharge status: Home / Self Care | Payer: BC Managed Care – PPO | Source: Ambulatory Visit | Attending: Internal Medicine | Admitting: Internal Medicine

## 2011-06-14 DIAGNOSIS — Z1231 Encounter for screening mammogram for malignant neoplasm of breast: Secondary | ICD-10-CM

## 2011-07-31 ENCOUNTER — Other Ambulatory Visit: Payer: Self-pay

## 2011-07-31 MED ORDER — TRIAMTERENE-HCTZ 37.5-25 MG PO TABS: 1.0000 | ORAL_TABLET | Freq: Every day | ORAL | Status: DC

## 2011-08-16 ENCOUNTER — Other Ambulatory Visit: Payer: Self-pay

## 2011-08-16 MED ORDER — TRIAMTERENE-HCTZ 37.5-25 MG PO TABS: 1.0000 | ORAL_TABLET | Freq: Every day | ORAL | Status: DC

## 2011-11-15 ENCOUNTER — Ambulatory Visit (INDEPENDENT_AMBULATORY_CARE_PROVIDER_SITE_OTHER): Payer: BC Managed Care – PPO | Admitting: Internal Medicine

## 2011-11-15 ENCOUNTER — Other Ambulatory Visit (INDEPENDENT_AMBULATORY_CARE_PROVIDER_SITE_OTHER): Payer: BC Managed Care – PPO

## 2011-11-15 ENCOUNTER — Encounter: Payer: Self-pay | Admitting: Internal Medicine

## 2011-11-15 VITALS — BP 112/88 | HR 87 | Temp 98.1°F | Ht 66.0 in | Wt 114.4 lb

## 2011-11-15 DIAGNOSIS — M25579 Pain in unspecified ankle and joints of unspecified foot: Secondary | ICD-10-CM

## 2011-11-15 DIAGNOSIS — Z0000 Encounter for general adult medical examination without abnormal findings: Secondary | ICD-10-CM

## 2011-11-15 DIAGNOSIS — I10 Essential (primary) hypertension: Secondary | ICD-10-CM

## 2011-11-15 DIAGNOSIS — M25571 Pain in right ankle and joints of right foot: Secondary | ICD-10-CM

## 2011-11-15 LAB — BASIC METABOLIC PANEL
BUN: 32 mg/dL — ABNORMAL HIGH (ref 6–23)
CO2: 28 mEq/L (ref 19–32)
Calcium: 10.3 mg/dL (ref 8.4–10.5)
Creatinine, Ser: 1.3 mg/dL — ABNORMAL HIGH (ref 0.4–1.2)

## 2011-11-15 LAB — TSH: TSH: 0.97 u[IU]/mL (ref 0.35–5.50)

## 2011-11-15 LAB — CBC WITH DIFFERENTIAL/PLATELET
Basophils Absolute: 0 10*3/uL (ref 0.0–0.1)
Basophils Relative: 0.2 % (ref 0.0–3.0)
Hemoglobin: 14.1 g/dL (ref 12.0–15.0)
Lymphocytes Relative: 22.6 % (ref 12.0–46.0)
Monocytes Relative: 8.1 % (ref 3.0–12.0)
Neutro Abs: 4 10*3/uL (ref 1.4–7.7)
RBC: 4.15 Mil/uL (ref 3.87–5.11)
RDW: 13 % (ref 11.5–14.6)

## 2011-11-15 LAB — URINALYSIS, ROUTINE W REFLEX MICROSCOPIC
Ketones, ur: NEGATIVE
Specific Gravity, Urine: 1.01 (ref 1.000–1.030)
Urine Glucose: NEGATIVE
pH: 7 (ref 5.0–8.0)

## 2011-11-15 LAB — HEPATIC FUNCTION PANEL
AST: 24 U/L (ref 0–37)
Albumin: 4.5 g/dL (ref 3.5–5.2)
Alkaline Phosphatase: 70 U/L (ref 39–117)
Bilirubin, Direct: 0.1 mg/dL (ref 0.0–0.3)

## 2011-11-15 LAB — LIPID PANEL: Triglycerides: 82 mg/dL (ref 0.0–149.0)

## 2011-11-15 LAB — LDL CHOLESTEROL, DIRECT: Direct LDL: 109.5 mg/dL

## 2011-11-15 MED ORDER — FLUTICASONE-SALMETEROL 250-50 MCG/DOSE IN AEPB: 1.0000 | INHALATION_SPRAY | Freq: Two times a day (BID) | RESPIRATORY_TRACT | Status: DC

## 2011-11-15 MED ORDER — TRIAMTERENE-HCTZ 37.5-25 MG PO TABS: 1.0000 | ORAL_TABLET | Freq: Every day | ORAL | Status: DC

## 2011-11-15 NOTE — Patient Instructions (Addendum)
Please remember to followup with your GYN for the yearly pap smear and/or mammogram Continue all other medications as before Your medication was refilled today Please go to LAB in the Basement for the blood and/or urine tests to be done today You will be contacted by phone if any changes need to be made immediately.  Otherwise, you will receive a letter about your results with an explanation. Please return in 1 year for your yearly visit, or sooner if needed, with Lab testing done 3-5 days before

## 2011-11-16 ENCOUNTER — Encounter: Payer: Self-pay | Admitting: Internal Medicine

## 2011-11-16 DIAGNOSIS — M25571 Pain in right ankle and joints of right foot: Secondary | ICD-10-CM | POA: Insufficient documentation

## 2011-11-16 NOTE — Progress Notes (Signed)
Subjective:    Patient ID: Andrea [name], female    DOB: Feb 06, 1951, 61 y.o.   MRN: [id number]  HPI  Here for wellness and f/u;  Overall doing ok;  Pt denies CP, worsening SOB, DOE, wheezing, orthopnea, PND, worsening LE edema, palpitations, dizziness or syncope.  Pt denies neurological change such as new Headache, facial or extremity weakness.  Pt denies polydipsia, polyuria, or low sugar symptoms. Pt states overall good compliance with treatment and medications, good tolerability, and trying to follow lower cholesterol diet.  Pt denies worsening depressive symptoms, suicidal ideation or panic. No fever, wt loss, night sweats, loss of appetite, or other constitutional symptoms.  Pt states good ability with ADL's, low fall risk, home safety reviewed and adequate, no significant changes in hearing or vision, and occasionally active with exercise.  Also has mild pain/swelling to lateral right ankle, worse after a days of standing/walking every day at work.   Past Medical History  Diagnosis Date  . COPD 03/23/2008  . Cough 11/05/2007  . HYPERTENSION 02/10/2007  . MUSCLE CRAMPS 08/09/2009  . OSTEOPOROSIS 02/10/2007  . PEPTIC ULCER DISEASE 03/23/2008  . PNEUMONIA, RIGHT UPPER LOBE 03/23/2008  . RESTLESS LEG SYNDROME 03/23/2008  . Unspecified disorder of thyroid 11/16/2007   Past Surgical History  Procedure Date  . Breast biopsy negative 1972    reports that she has quit smoking. She does not have any smokeless tobacco history on file. She reports that she drinks alcohol. She reports that she does not use illicit drugs. family history includes Leukemia in her father. Allergies  Allergen Reactions  . Alendronate Sodium     GI upset  . Sertraline Hcl    Current Outpatient Prescriptions on File Prior to Visit  Medication Sig Dispense Refill  . Fluticasone-Salmeterol (ADVAIR DISKUS) 250-50 MCG/DOSE AEPB Inhale 1 puff into the lungs 2 (two) times daily.  180 each  3  .  triamterene-hydrochlorothiazide (MAXZIDE-25) 37.5-25 MG per tablet Take 1 each (1 tablet total) by mouth daily.  90 tablet  3   Review of Systems Review of Systems  Constitutional: Negative for diaphoresis, activity change, appetite change and unexpected weight change.  HENT: Negative for hearing loss, ear pain, facial swelling, mouth sores and neck stiffness.   Eyes: Negative for pain, redness and visual disturbance.  Respiratory: Negative for shortness of breath and wheezing.   Cardiovascular: Negative for chest pain and palpitations.  Gastrointestinal: Negative for diarrhea, blood in stool, abdominal distention and rectal pain.  Genitourinary: Negative for hematuria, flank pain and decreased urine volume.  Musculoskeletal: Negative for myalgias and joint swelling.  Skin: Negative for color change and wound.  Neurological: Negative for syncope and numbness.  Hematological: Negative for adenopathy.  Psychiatric/Behavioral: Negative for hallucinations, self-injury, decreased concentration and agitation.      Objective:   Physical Exam BP 112/88  Pulse 87  Temp 98.1 F (36.7 C) (Oral)  Ht 5\' 6"  (1.676 m)  Wt 114 lb 6 oz (51.88 kg)  BMI 18.46 kg/m2  SpO2 98% Physical Exam  VS noted Constitutional: Pt is oriented to person, place, and time. Appears well-developed and well-nourished.  HENT:  Head: Normocephalic and atraumatic.  Right Ear: External ear normal.  Left Ear: External ear normal.  Nose: Nose normal.  Mouth/Throat: Oropharynx is clear and moist.  Eyes: Conjunctivae and EOM are normal. Pupils are equal, round, and reactive to light.  Neck: Normal range of motion. Neck supple. No JVD present. No tracheal deviation present.  Cardiovascular: Normal rate,  regular rhythm, normal heart sounds and intact distal pulses.   Pulmonary/Chest: Effort normal and breath sounds normal.  Abdominal: Soft. Bowel sounds are normal. There is no tenderness.  Musculoskeletal: Normal range of  motion. Exhibits no edema.  Lymphadenopathy:  Has no cervical adenopathy.  Neurological: Pt is alert and oriented to person, place, and time. Pt has normal reflexes. No cranial nerve deficit.  Skin: Skin is warm and dry. No rash noted.  Psychiatric:  Has  normal mood and affect. Behavior is normal.  Right lateral ankle with mild tender/localized swelling, no bruise    Assessment & Plan:

## 2011-11-16 NOTE — Assessment & Plan Note (Signed)
With prob mild tendonitis, mild, ok for tylenol prn, for film or ortho if worsens

## 2011-11-16 NOTE — Assessment & Plan Note (Signed)

## 2011-11-16 NOTE — Assessment & Plan Note (Signed)
stable overall by hx and exam, most recent data reviewed with pt, and pt to continue medical treatment as before BP Readings from Last 3 Encounters:  11/15/11 112/88  12/13/10 110/80  08/09/09 120/84

## 2011-11-18 ENCOUNTER — Other Ambulatory Visit: Payer: Self-pay | Admitting: Internal Medicine

## 2012-03-13 ENCOUNTER — Other Ambulatory Visit: Payer: Self-pay | Admitting: Endocrinology

## 2012-03-24 ENCOUNTER — Telehealth: Payer: Self-pay | Admitting: Internal Medicine

## 2012-03-24 NOTE — Telephone Encounter (Signed)
Please do not send anymore med refills of ADVAIR DISKUS 250-50 MCG unless requested by pt.

## 2012-05-18 ENCOUNTER — Telehealth: Payer: Self-pay

## 2012-05-18 ENCOUNTER — Other Ambulatory Visit: Payer: Self-pay | Admitting: Internal Medicine

## 2012-05-18 DIAGNOSIS — Z1231 Encounter for screening mammogram for malignant neoplasm of breast: Secondary | ICD-10-CM

## 2012-05-18 LAB — HM MAMMOGRAPHY: HM Mammogram: NEGATIVE

## 2012-05-18 NOTE — Telephone Encounter (Signed)
Patient called requesting appointment for ongoing drainage in her throat.  Informed if ok with patient is ok with PCP to see NP.  Patient agreed to do so. Transferred to a scheduler.

## 2012-05-19 ENCOUNTER — Ambulatory Visit (INDEPENDENT_AMBULATORY_CARE_PROVIDER_SITE_OTHER): Payer: BC Managed Care – PPO | Admitting: Internal Medicine

## 2012-05-19 ENCOUNTER — Telehealth: Payer: Self-pay | Admitting: Internal Medicine

## 2012-05-19 ENCOUNTER — Encounter: Payer: Self-pay | Admitting: Internal Medicine

## 2012-05-19 VITALS — BP 122/82 | HR 87 | Temp 98.3°F | Ht 66.0 in | Wt 112.4 lb

## 2012-05-19 DIAGNOSIS — J309 Allergic rhinitis, unspecified: Secondary | ICD-10-CM

## 2012-05-19 DIAGNOSIS — F411 Generalized anxiety disorder: Secondary | ICD-10-CM

## 2012-05-19 DIAGNOSIS — F419 Anxiety disorder, unspecified: Secondary | ICD-10-CM

## 2012-05-19 MED ORDER — CETIRIZINE HCL 10 MG PO TABS: 10.0000 mg | ORAL_TABLET | Freq: Every day | ORAL | Status: DC

## 2012-05-19 MED ORDER — FLUTICASONE PROPIONATE 50 MCG/ACT NA SUSP: 2.0000 | Freq: Every day | NASAL | Status: DC

## 2012-05-19 MED ORDER — ALPRAZOLAM 0.25 MG PO TABS: 0.2500 mg | ORAL_TABLET | Freq: Two times a day (BID) | ORAL | Status: DC | PRN

## 2012-05-19 NOTE — Telephone Encounter (Signed)
Pt informed work note ready for pickup, placed upfront in cabinet.

## 2012-05-19 NOTE — Patient Instructions (Signed)
Allergic Rhinitis  Allergic rhinitis is when the mucous membranes in the nose respond to allergens. Allergens are particles in the air that cause your body to have an allergic reaction. This causes you to release allergic antibodies. Through a chain of events, these eventually cause you to release histamine into the blood stream (hence the use of antihistamines). Although meant to be protective to the body, it is this release that causes your discomfort, such as frequent sneezing, congestion and an itchy runny nose.    CAUSES    The pollen allergens may come from grasses, trees, and weeds. This is seasonal allergic rhinitis, or "hay fever." Other allergens cause year-round allergic rhinitis (perennial allergic rhinitis) such as house dust mite allergen, pet dander and mold spores.    SYMPTOMS     Nasal stuffiness (congestion).   Runny, itchy nose with sneezing and tearing of the eyes.   There is often an itching of the mouth, eyes and ears.  It cannot be cured, but it can be controlled with medications.  DIAGNOSIS    If you are unable to determine the offending allergen, skin or blood testing may find it.  TREATMENT     Avoid the allergen.   Medications and allergy shots (immunotherapy) can help.   Hay fever may often be treated with antihistamines in pill or nasal spray forms. Antihistamines block the effects of histamine. There are over-the-counter medicines that may help with nasal congestion and swelling around the eyes. Check with your caregiver before taking or giving this medicine.  If the treatment above does not work, there are many new medications your caregiver can prescribe. Stronger medications may be used if initial measures are ineffective. Desensitizing injections can be used if medications and avoidance fails. Desensitization is when a patient is given ongoing shots until the body becomes less sensitive to the allergen. Make sure you follow up with your caregiver if problems continue.   SEEK MEDICAL CARE IF:     You develop fever (more than 100.5 F (38.1 C).   You develop a cough that does not stop easily (persistent).   You have shortness of breath.   You start wheezing.   Symptoms interfere with normal daily activities.  Document Released: 01/01/2001 Document Revised: 07/01/2011 Document Reviewed: 07/13/2008  [employer] Patient Information 2013 [employer].

## 2012-05-19 NOTE — Progress Notes (Signed)
HPI  Pt presents to the clinic today with c/o nasal congestion and post nasal drip x 2 years. She thinks that the place where she works is contributing to this. It is very dusty there. She has not history of allergies before. She has not taken anything for it. It does aggravate her. Additionally, she c/o of anxiety and panic attacks. It can happen anytime and anywhere. Her heart feels like it is racing and she has trouble breathing. She usually sits down or lays down until it resolves. She has not taken any medication for this. She has never had problems with anxiety before.  Review of Systems      Past Medical History  Diagnosis Date  . COPD 03/23/2008  . Cough 11/05/2007  . HYPERTENSION 02/10/2007  . MUSCLE CRAMPS 08/09/2009  . OSTEOPOROSIS 02/10/2007  . PEPTIC ULCER DISEASE 03/23/2008  . PNEUMONIA, RIGHT UPPER LOBE 03/23/2008  . RESTLESS LEG SYNDROME 03/23/2008  . Unspecified disorder of thyroid 11/16/2007    Family History  Problem Relation Age of Onset  . Leukemia Father     History   Social History  . Marital Status: Married    Spouse Name: N/A    Number of Children: N/A  . Years of Education: N/A   Occupational History  . environmental services    Social History Main Topics  . Smoking status: Former Games developer  . Smokeless tobacco: Not on file     Comment: since 2009  . Alcohol Use: Yes  . Drug Use: No  . Sexually Active: Not on file   Other Topics Concern  . Not on file   Social History Narrative  . No narrative on file    Allergies  Allergen Reactions  . Alendronate Sodium     GI upset  . Sertraline Hcl      Constitutional:  Denies headache, fatigue, fever or abrupt weight changes.  HEENT:  . Denies eye redness, eye pain, pressure behind the eyes, facial pain, nasal congestion, ear pain, ringing in the ears, wax buildup, runny nose or bloody nose. Respiratory: Positive cough. Denies difficulty breathing or shortness of breath.  Cardiovascular: Denies  chest pain, chest tightness, palpitations or swelling in the hands or feet.   No other specific complaints in a complete review of systems (except as listed in HPI above).  Objective:   BP 122/82  Pulse 87  Temp 98.3 F (36.8 C) (Oral)  Ht 5\' 6"  (1.676 m)  Wt 112 lb 6.4 oz (50.984 kg)  BMI 18.14 kg/m2  SpO2 97% Wt Readings from Last 3 Encounters:  05/19/12 112 lb 6.4 oz (50.984 kg)  11/15/11 114 lb 6 oz (51.88 kg)  12/13/10 112 lb 6 oz (50.973 kg)     General: Appears her stated age, well developed, well nourished in NAD. HEENT: Head: normal shape and size; Eyes: sclera white, no icterus, conjunctiva pink, PERRLA and EOMs intact; Ears: Tm's gray and intact, normal light reflex; Nose: mucosa pink and moist, septum midline; Throat/Mouth: + PND. Teeth present, mucosa erythematous and moist, no exudate noted, no lesions or ulcerations noted.  Neck: Neck supple, trachea midline. No massses, lumps or thyromegaly present.  Cardiovascular: Normal rate and rhythm. S1,S2 noted.  No murmur, rubs or gallops noted. No JVD or BLE edema. No carotid bruits noted. Pulmonary/Chest: Normal effort and positive vesicular breath sounds. No respiratory distress. No wheezes, rales or ronchi noted.      Assessment & Plan:  Allergic rhinosinusitis, new onset with additional workup required:  Get some rest and drink plenty of water eRx for Zyrtec 10 mg daily eRx for flonase  Anxiety with palpitations, new onset with additional workup required:  Practice relaxation techniques eRx for xanax 0.25 mg prn  RTC as needed or if symptoms persist.

## 2012-05-19 NOTE — Telephone Encounter (Signed)
Pt was seen today.  She needs a work note for today.  Let her know when ready to pick up.

## 2012-06-09 ENCOUNTER — Telehealth: Payer: Self-pay | Admitting: Internal Medicine

## 2012-06-09 DIAGNOSIS — J309 Allergic rhinitis, unspecified: Secondary | ICD-10-CM

## 2012-06-09 NOTE — Telephone Encounter (Signed)
Patient is requesting a new medication to substitute the cetirizine, she said when she took that medication it caused her tongue to burn so she wants to try something different

## 2012-06-09 NOTE — Telephone Encounter (Signed)
Ash, There really is no other prescription substitute. She can try OTC Allegra or Claritin.

## 2012-06-10 NOTE — Telephone Encounter (Signed)
Left message for pt to callback office.  

## 2012-06-11 NOTE — Telephone Encounter (Signed)
Pt informed of NP's advisement.  

## 2012-06-16 ENCOUNTER — Ambulatory Visit
Admission: RE | Admit: 2012-06-16 | Discharge: 2012-06-16 | Disposition: A | Discharge status: Home / Self Care | Payer: BC Managed Care – PPO | Source: Ambulatory Visit | Attending: Internal Medicine | Admitting: Internal Medicine

## 2012-06-16 DIAGNOSIS — Z1231 Encounter for screening mammogram for malignant neoplasm of breast: Secondary | ICD-10-CM

## 2012-08-07 ENCOUNTER — Encounter: Payer: Self-pay | Admitting: Internal Medicine

## 2012-08-07 ENCOUNTER — Ambulatory Visit (INDEPENDENT_AMBULATORY_CARE_PROVIDER_SITE_OTHER): Payer: BC Managed Care – PPO | Admitting: Internal Medicine

## 2012-08-07 VITALS — BP 106/78 | HR 84 | Temp 98.1°F | Ht 66.0 in | Wt 114.1 lb

## 2012-08-07 DIAGNOSIS — B370 Candidal stomatitis: Secondary | ICD-10-CM | POA: Insufficient documentation

## 2012-08-07 DIAGNOSIS — J309 Allergic rhinitis, unspecified: Secondary | ICD-10-CM

## 2012-08-07 DIAGNOSIS — I10 Essential (primary) hypertension: Secondary | ICD-10-CM

## 2012-08-07 DIAGNOSIS — E785 Hyperlipidemia, unspecified: Secondary | ICD-10-CM

## 2012-08-07 HISTORY — DX: Allergic rhinitis, unspecified: J30.9

## 2012-08-07 HISTORY — DX: Hyperlipidemia, unspecified: E78.5

## 2012-08-07 MED ORDER — PREDNISONE 10 MG PO TABS: ORAL_TABLET | ORAL | Status: DC

## 2012-08-07 MED ORDER — BECLOMETHASONE DIPROPIONATE 80 MCG/ACT NA AERS: 1.0000 | INHALATION_SPRAY | Freq: Every day | NASAL | Status: DC

## 2012-08-07 MED ORDER — NYSTATIN 100000 UNIT/ML MT SUSP: 500000.0000 [IU] | Freq: Four times a day (QID) | OROMUCOSAL | Status: DC

## 2012-08-07 NOTE — Assessment & Plan Note (Signed)
Mild, likely realted to flonase use, for nsytatin asd prn

## 2012-08-07 NOTE — Assessment & Plan Note (Signed)
Mild, d/w pt, for lower chol diet Lab Results  Component Value Date   LDLCALC 104* 12/13/2010

## 2012-08-07 NOTE — Patient Instructions (Addendum)
Please take all new medication as prescribed - the short course of low dose prednisone, and the Qnasl spray for allergies, and the oral solution for the thrush OK to stop the flonase and nasonex We could consider Astelin nasal spray and/or singulair (a once per day pill) for allergies if you need further help Please continue all other medications as before - including the zyrtec We could also consider referral to Allergist if needed Please continue your efforts at being more active, low cholesterol diet. Thank you for enrolling in MyChart. Please follow the instructions below to securely access your online medical record. MyChart allows you to send messages to your doctor, view your test results, renew your prescriptions, schedule appointments, and more. To Log into My Chart online, please go by [employer] or [employer] to [email or web address], or download the MyChart App from the [employer] of [employer] .  Your Username is: red (pass bone) Please send a practice Message on Mychart later today. Please return about July 2014, or sooner if needed, with Lab testing done 3-5 days before

## 2012-08-07 NOTE — Progress Notes (Signed)
Subjective:    Patient ID: Andrea [name], female    DOB: Nov 07, 1950, 62 y.o.   MRN: [id number]  HPI  Here to f/u; overall doing ok,  Pt denies chest pain, increased sob or doe, wheezing, orthopnea, PND, increased LE swelling, palpitations, dizziness or syncope.  Pt denies polydipsia, polyuria, or low sugar symptoms such as weakness or confusion improved with po intake.  Pt denies new neurological symptoms such as new headache, or facial or extremity weakness or numbness.   Pt states overall good compliance with meds, has been trying to follow lower cholesterol diet, with wt overall stable,  but little exercise however. Does have several wks ongoing nasal allergy symptoms with clearish congestion, itch and sneezing, without fever, pain, ST, cough, swelling or wheezing, better with flonase recently but now with thrush to tongue and no longer wants to take this Past Medical History  Diagnosis Date  . COPD 03/23/2008  . Cough 11/05/2007  . HYPERTENSION 02/10/2007  . MUSCLE CRAMPS 08/09/2009  . OSTEOPOROSIS 02/10/2007  . PEPTIC ULCER DISEASE 03/23/2008  . PNEUMONIA, RIGHT UPPER LOBE 03/23/2008  . RESTLESS LEG SYNDROME 03/23/2008  . Unspecified disorder of thyroid 11/16/2007   Past Surgical History  Procedure Laterality Date  . Breast biopsy negative  1972    reports that she has quit smoking. She does not have any smokeless tobacco history on file. She reports that  drinks alcohol. She reports that she does not use illicit drugs. family history includes Leukemia in her father. Allergies  Allergen Reactions  . Alendronate Sodium     GI upset  . Sertraline Hcl   . Zyrtec (Cetirizine)     Tongue burning, thrush   Current Outpatient Prescriptions on File Prior to Visit  Medication Sig Dispense Refill  . ALPRAZolam (XANAX) 0.25 MG tablet Take 1 tablet (0.25 mg total) by mouth 2 (two) times daily as needed for sleep.  20 tablet  0  . triamterene-hydrochlorothiazide (DYAZIDE) 37.5-25 MG per capsule  Take 1 each (1 capsule total) by mouth daily.  90 capsule  3  . ADVAIR DISKUS 250-50 MCG/DOSE AEPB USE ONE INHALATION INTO THE LUNGS TWO TIMES A DAY  180 each  2  . doxycycline (PERIOSTAT) 20 MG tablet Take 20 mg by mouth 2 (two) times daily.        No current facility-administered medications on file prior to visit.   Review of Systems  Constitutional: Negative for unexpected weight change, or unusual diaphoresis  HENT: Negative for tinnitus.   Eyes: Negative for photophobia and visual disturbance.  Respiratory: Negative for choking and stridor.   Gastrointestinal: Negative for vomiting and blood in stool.  Genitourinary: Negative for hematuria and decreased urine volume.  Musculoskeletal: Negative for acute joint swelling Skin: Negative for color change and wound.  Neurological: Negative for tremors and numbness other than noted  Psychiatric/Behavioral: Negative for decreased concentration or  hyperactivity.       Objective:   Physical Exam BP 106/78  Pulse 84  Temp(Src) 98.1 F (36.7 C) (Oral)  Ht 5\' 6"  (1.676 m)  Wt 114 lb 2 oz (51.767 kg)  BMI 18.43 kg/m2  SpO2 97% VS noted,  Constitutional: Pt appears well-developed and well-nourished.  HENT: Head: NCAT.  Right Ear: External ear normal.  Left Ear: External ear normal.  Bilat tm's with mild erythema.  Max sinus areas non tender.  Pharynx with mild erythema, no exudate, but tongue with mild twhite thrush Eyes: Conjunctivae and EOM are normal. Pupils are equal,  round, and reactive to light.  Neck: Normal range of motion. Neck supple.  Cardiovascular: Normal rate and regular rhythm.   Pulmonary/Chest: Effort normal and breath sounds normal.  Abd:  Soft, NT, non-distended, + BS Neurological: Pt is alert. Not confused  Skin: Skin is warm. No erythema.  Psychiatric: Pt behavior is normal. Thought content normal.     Assessment & Plan:

## 2012-08-07 NOTE — Assessment & Plan Note (Signed)
midl to mod recent flare, for predpac asd, change flonase to qnasl,  to f/u any worsening symptoms or concerns

## 2012-08-07 NOTE — Assessment & Plan Note (Signed)
stable overall by history and exam, recent data reviewed with pt, and pt to continue medical treatment as before,  to f/u any worsening symptoms or concerns BP Readings from Last 3 Encounters:  08/07/12 106/78  05/19/12 122/82  11/15/11 112/88

## 2012-12-14 ENCOUNTER — Other Ambulatory Visit (INDEPENDENT_AMBULATORY_CARE_PROVIDER_SITE_OTHER): Payer: BC Managed Care – PPO

## 2012-12-14 ENCOUNTER — Encounter: Payer: Self-pay | Admitting: Internal Medicine

## 2012-12-14 ENCOUNTER — Ambulatory Visit (INDEPENDENT_AMBULATORY_CARE_PROVIDER_SITE_OTHER): Payer: BC Managed Care – PPO | Admitting: Internal Medicine

## 2012-12-14 VITALS — BP 120/80 | HR 90 | Temp 98.8°F | Ht 66.0 in | Wt 114.0 lb

## 2012-12-14 DIAGNOSIS — J309 Allergic rhinitis, unspecified: Secondary | ICD-10-CM

## 2012-12-14 DIAGNOSIS — B370 Candidal stomatitis: Secondary | ICD-10-CM

## 2012-12-14 DIAGNOSIS — Z0000 Encounter for general adult medical examination without abnormal findings: Secondary | ICD-10-CM

## 2012-12-14 DIAGNOSIS — R7989 Other specified abnormal findings of blood chemistry: Secondary | ICD-10-CM

## 2012-12-14 LAB — CBC WITH DIFFERENTIAL/PLATELET
Basophils Absolute: 0 10*3/uL (ref 0.0–0.1)
Eosinophils Absolute: 0.1 10*3/uL (ref 0.0–0.7)
HCT: 39.9 % (ref 36.0–46.0)
Hemoglobin: 14.1 g/dL (ref 12.0–15.0)
Lymphs Abs: 1.2 10*3/uL (ref 0.7–4.0)
MCHC: 35.3 g/dL (ref 30.0–36.0)
Monocytes Relative: 8.3 % (ref 3.0–12.0)
Neutro Abs: 3.2 10*3/uL (ref 1.4–7.7)
RDW: 12.8 % (ref 11.5–14.6)

## 2012-12-14 LAB — HEPATIC FUNCTION PANEL
Alkaline Phosphatase: 57 U/L (ref 39–117)
Bilirubin, Direct: 0.1 mg/dL (ref 0.0–0.3)

## 2012-12-14 LAB — LIPID PANEL
HDL: 64.7 mg/dL (ref >39.00)
Total CHOL/HDL Ratio: 3
Triglycerides: 232 mg/dL — ABNORMAL HIGH (ref 0.0–149.0)

## 2012-12-14 LAB — URINALYSIS, ROUTINE W REFLEX MICROSCOPIC
Ketones, ur: NEGATIVE
RBC / HPF: NONE SEEN (ref 0–?)
Specific Gravity, Urine: 1.015 (ref 1.000–1.030)
Total Protein, Urine: NEGATIVE
Urine Glucose: NEGATIVE
Urobilinogen, UA: 1 (ref 0.0–1.0)

## 2012-12-14 LAB — LDL CHOLESTEROL, DIRECT: Direct LDL: 111.6 mg/dL

## 2012-12-14 LAB — BASIC METABOLIC PANEL
Calcium: 9.9 mg/dL (ref 8.4–10.5)
GFR: 53.97 mL/min — ABNORMAL LOW (ref >60.00)
Sodium: 137 mEq/L (ref 135–145)

## 2012-12-14 MED ORDER — TRIAMTERENE-HCTZ 37.5-25 MG PO CAPS: 1.0000 | ORAL_CAPSULE | Freq: Every day | ORAL | Status: DC

## 2012-12-14 NOTE — Progress Notes (Addendum)
Subjective:    Patient ID: Andrea [name], female    DOB: [birth year], 62 y.o.   MRN: [id number]  HPI  Here for wellness and f/u;  Overall doing ok;  Pt denies CP, worsening SOB, DOE, wheezing, orthopnea, PND, worsening LE edema, palpitations, dizziness or syncope.  Pt denies neurological change such as new headache, facial or extremity weakness.  Pt denies polydipsia, polyuria, or low sugar symptoms. Pt states overall good compliance with treatment and medications, good tolerability, and has been trying to follow lower cholesterol diet.  Pt denies worsening depressive symptoms, suicidal ideation or panic. No fever, night sweats, wt loss, loss of appetite, or other constitutional symptoms.  Pt states good ability with ADL's, has low fall risk, home safety reviewed and adequate, no other significant changes in hearing or vision, and only occasionally active with exercise.  No acute complaints. Working full time, ,  Also menitons would like refill of the nystatin for thrush like rash to tongue. Does have several wks ongoing nasal allergy symptoms with clearish congestion, itch and sneezing, without fever, pain, ST, cough, swelling or wheezing. Past Medical History  Diagnosis Date  . COPD 03/23/2008  . Cough 11/05/2007  . HYPERTENSION 02/10/2007  . MUSCLE CRAMPS 08/09/2009  . OSTEOPOROSIS 02/10/2007  . PEPTIC ULCER DISEASE 03/23/2008  . PNEUMONIA, RIGHT UPPER LOBE 03/23/2008  . RESTLESS LEG SYNDROME 03/23/2008  . Unspecified disorder of thyroid 11/16/2007  . Allergic rhinitis, cause unspecified 08/07/2012  . Other and unspecified hyperlipidemia 08/07/2012   Past Surgical History  Procedure Laterality Date  . Breast biopsy negative  1972    reports that she has quit smoking. She does not have any smokeless tobacco history on file. She reports that  drinks alcohol. She reports that she does not use illicit drugs. family history includes Leukemia in her father. Allergies  Allergen Reactions  .  Alendronate Sodium     GI upset  . Sertraline Hcl   . Zyrtec [Cetirizine]     Tongue burning, thrush   Current Outpatient Prescriptions on File Prior to Visit  Medication Sig Dispense Refill  . ADVAIR DISKUS 250-50 MCG/DOSE AEPB USE ONE INHALATION INTO THE LUNGS TWO TIMES A DAY  180 each  2  . ALPRAZolam (XANAX) 0.25 MG tablet Take 1 tablet (0.25 mg total) by mouth 2 (two) times daily as needed for sleep.  20 tablet  0  . Beclomethasone Dipropionate (QNASL) 80 MCG/ACT AERS Place 1 spray into the nose daily.  8.7 g  5  . doxycycline (PERIOSTAT) 20 MG tablet Take 20 mg by mouth 2 (two) times daily.       Marland Kitchen nystatin (MYCOSTATIN) 100000 UNIT/ML suspension Take 5 mLs (500,000 Units total) by mouth 4 (four) times daily.  60 mL  0   No current facility-administered medications on file prior to visit.   Review of Systems Constitutional: Negative for diaphoresis, activity change, appetite change or unexpected weight change.  HENT: Negative for hearing loss, ear pain, facial swelling, mouth sores and neck stiffness.   Eyes: Negative for pain, redness and visual disturbance.  Respiratory: Negative for shortness of breath and wheezing.   Cardiovascular: Negative for chest pain and palpitations.  Gastrointestinal: Negative for diarrhea, blood in stool, abdominal distention or other pain Genitourinary: Negative for hematuria, flank pain or change in urine volume.  Musculoskeletal: Negative for myalgias and joint swelling.  Skin: Negative for color change and wound.  Neurological: Negative for syncope and numbness. other than noted Hematological: Negative for adenopathy.  Psychiatric/Behavioral: Negative for hallucinations, self-injury, decreased concentration and agitation.      Objective:   Physical Exam BP 120/80  Pulse 90  Temp(Src) 98.8 F (37.1 C) (Oral)  Ht 5\' 6"  (1.676 m)  Wt 114 lb (51.71 kg)  BMI 18.41 kg/m2  SpO2 96% VS noted,  Constitutional: Pt is oriented to person, place,  and time. Appears well-developed and well-nourished.  Head: Normocephalic and atraumatic.  Right Ear: External ear normal.  Left Ear: External ear normal.  Nose: Nose normal.  Mouth/Throat: Oropharynx is clear and moist. tongue with thrush like rash, whitish coating Bilat tm's with mild erythema.  Max sinus areas tender.  Pharynx with mild erythema, no exudate Eyes: Conjunctivae and EOM are normal. Pupils are equal, round, and reactive to light.  Neck: Normal range of motion. Neck supple. No JVD present. No tracheal deviation present.  Cardiovascular: Normal rate, regular rhythm, normal heart sounds and intact distal pulses.   Pulmonary/Chest: Effort normal and breath sounds normal.  Abdominal: Soft. Bowel sounds are normal. There is no tenderness. No HSM  Musculoskeletal: Normal range of motion. Exhibits no edema.  Lymphadenopathy:  Has no cervical adenopathy.  Neurological: Pt is alert and oriented to person, place, and time. Pt has normal reflexes. No cranial nerve deficit., motor 5/5, dtr, gait intact Skin: Skin is warm and dry. No rash noted.  Psychiatric:  Has  normal mood and affect. Behavior is normal.     Assessment & Plan:

## 2012-12-14 NOTE — Assessment & Plan Note (Signed)
Ok for nystatin

## 2012-12-14 NOTE — Assessment & Plan Note (Signed)

## 2012-12-14 NOTE — Patient Instructions (Signed)
Please return if you change your mind about the shingles shot Your EKG was ok today You are given the copy of last yrs blood work Please take all new medication as prescribed  - the nysatatin solution for the tongue thrush You can also try OTC Zyrtec for allergies Please call for allergy referral if not improved Please go to the LAB in the Basement (turn left off the elevator) for the tests to be done today You will be contacted by phone if any changes need to be made immediately.  Otherwise, you will receive a letter about your results with an explanation, but please check with MyChart first.  Please remember to sign up for My Chart if you have not done so, as this will be important to you in the future with finding out test results, communicating by private email, and scheduling acute appointments online when needed.  Please return in 1 year for your yearly visit, or sooner if needed, with Lab testing done 3-5 days before

## 2012-12-14 NOTE — Assessment & Plan Note (Signed)
Ok for zyrtec prn,  to f/u any worsening symptoms or concerns  

## 2013-02-25 ENCOUNTER — Other Ambulatory Visit: Payer: Self-pay

## 2013-05-20 ENCOUNTER — Other Ambulatory Visit: Payer: Self-pay

## 2013-05-20 DIAGNOSIS — Z1231 Encounter for screening mammogram for malignant neoplasm of breast: Secondary | ICD-10-CM

## 2013-06-25 ENCOUNTER — Ambulatory Visit
Admission: RE | Admit: 2013-06-25 | Discharge: 2013-06-25 | Disposition: A | Discharge status: Home / Self Care | Payer: Self-pay | Source: Ambulatory Visit

## 2013-06-25 DIAGNOSIS — Z1231 Encounter for screening mammogram for malignant neoplasm of breast: Secondary | ICD-10-CM

## 2014-02-04 ENCOUNTER — Other Ambulatory Visit: Payer: Self-pay | Admitting: Internal Medicine

## 2014-02-11 ENCOUNTER — Inpatient Hospital Stay (HOSPITAL_COMMUNITY)
Admission: EM | Admit: 2014-02-11 | Discharge: 2014-02-12 | DRG: 069 | Disposition: A | Discharge status: Home / Self Care | Payer: BC Managed Care – PPO | Attending: Internal Medicine | Admitting: Internal Medicine

## 2014-02-11 ENCOUNTER — Encounter (HOSPITAL_COMMUNITY): Payer: Self-pay | Admitting: Emergency Medicine

## 2014-02-11 ENCOUNTER — Emergency Department (HOSPITAL_COMMUNITY): Payer: BC Managed Care – PPO

## 2014-02-11 DIAGNOSIS — K279 Peptic ulcer, site unspecified, unspecified as acute or chronic, without hemorrhage or perforation: Secondary | ICD-10-CM | POA: Diagnosis present

## 2014-02-11 DIAGNOSIS — E860 Dehydration: Secondary | ICD-10-CM | POA: Diagnosis present

## 2014-02-11 DIAGNOSIS — J449 Chronic obstructive pulmonary disease, unspecified: Secondary | ICD-10-CM | POA: Diagnosis present

## 2014-02-11 DIAGNOSIS — I10 Essential (primary) hypertension: Secondary | ICD-10-CM | POA: Diagnosis present

## 2014-02-11 DIAGNOSIS — R200 Anesthesia of skin: Secondary | ICD-10-CM | POA: Diagnosis not present

## 2014-02-11 DIAGNOSIS — Z881 Allergy status to other antibiotic agents status: Secondary | ICD-10-CM | POA: Diagnosis not present

## 2014-02-11 DIAGNOSIS — M810 Age-related osteoporosis without current pathological fracture: Secondary | ICD-10-CM | POA: Diagnosis present

## 2014-02-11 DIAGNOSIS — Z7982 Long term (current) use of aspirin: Secondary | ICD-10-CM

## 2014-02-11 DIAGNOSIS — E785 Hyperlipidemia, unspecified: Secondary | ICD-10-CM | POA: Diagnosis present

## 2014-02-11 DIAGNOSIS — N289 Disorder of kidney and ureter, unspecified: Secondary | ICD-10-CM | POA: Diagnosis present

## 2014-02-11 DIAGNOSIS — G459 Transient cerebral ischemic attack, unspecified: Principal | ICD-10-CM | POA: Diagnosis present

## 2014-02-11 DIAGNOSIS — G2581 Restless legs syndrome: Secondary | ICD-10-CM

## 2014-02-11 DIAGNOSIS — J189 Pneumonia, unspecified organism: Secondary | ICD-10-CM

## 2014-02-11 DIAGNOSIS — Z79899 Other long term (current) drug therapy: Secondary | ICD-10-CM | POA: Diagnosis not present

## 2014-02-11 DIAGNOSIS — Z8701 Personal history of pneumonia (recurrent): Secondary | ICD-10-CM

## 2014-02-11 DIAGNOSIS — E079 Disorder of thyroid, unspecified: Secondary | ICD-10-CM | POA: Diagnosis present

## 2014-02-11 DIAGNOSIS — Z87891 Personal history of nicotine dependence: Secondary | ICD-10-CM | POA: Diagnosis not present

## 2014-02-11 DIAGNOSIS — B370 Candidal stomatitis: Secondary | ICD-10-CM

## 2014-02-11 DIAGNOSIS — J42 Unspecified chronic bronchitis: Secondary | ICD-10-CM

## 2014-02-11 DIAGNOSIS — Z0000 Encounter for general adult medical examination without abnormal findings: Secondary | ICD-10-CM

## 2014-02-11 DIAGNOSIS — R252 Cramp and spasm: Secondary | ICD-10-CM

## 2014-02-11 DIAGNOSIS — I639 Cerebral infarction, unspecified: Secondary | ICD-10-CM

## 2014-02-11 LAB — COMPREHENSIVE METABOLIC PANEL
ALBUMIN: 4.4 g/dL (ref 3.5–5.2)
ALK PHOS: 59 U/L (ref 39–117)
ALT: 13 U/L (ref 0–35)
AST: 22 U/L (ref 0–37)
Anion gap: 14 (ref 5–15)
BILIRUBIN TOTAL: 0.5 mg/dL (ref 0.3–1.2)
BUN: 35 mg/dL — ABNORMAL HIGH (ref 6–23)
CHLORIDE: 98 meq/L (ref 96–112)
CO2: 27 mEq/L (ref 19–32)
Calcium: 10.4 mg/dL (ref 8.4–10.5)
Creatinine, Ser: 1.29 mg/dL — ABNORMAL HIGH (ref 0.50–1.10)
GFR calc Af Amer: 50 mL/min — ABNORMAL LOW (ref >90)
GFR calc non Af Amer: 43 mL/min — ABNORMAL LOW (ref >90)
Glucose, Bld: 94 mg/dL (ref 70–99)
POTASSIUM: 4.2 meq/L (ref 3.7–5.3)
SODIUM: 139 meq/L (ref 137–147)
TOTAL PROTEIN: 8.6 g/dL — AB (ref 6.0–8.3)

## 2014-02-11 LAB — DIFFERENTIAL
BASOS PCT: 1 % (ref 0–1)
Basophils Absolute: 0 10*3/uL (ref 0.0–0.1)
EOS ABS: 0.1 10*3/uL (ref 0.0–0.7)
Eosinophils Relative: 2 % (ref 0–5)
Lymphocytes Relative: 20 % (ref 12–46)
Lymphs Abs: 1 10*3/uL (ref 0.7–4.0)
Monocytes Absolute: 0.3 10*3/uL (ref 0.1–1.0)
Monocytes Relative: 5 % (ref 3–12)
NEUTROS ABS: 3.9 10*3/uL (ref 1.7–7.7)
Neutrophils Relative %: 72 % (ref 43–77)

## 2014-02-11 LAB — CBC
HCT: 38.9 % (ref 36.0–46.0)
Hemoglobin: 13.5 g/dL (ref 12.0–15.0)
MCH: 33.4 pg (ref 26.0–34.0)
MCHC: 34.7 g/dL (ref 30.0–36.0)
MCV: 96.3 fL (ref 78.0–100.0)
PLATELETS: 255 10*3/uL (ref 150–400)
RBC: 4.04 MIL/uL (ref 3.87–5.11)
RDW: 12.8 % (ref 11.5–15.5)
WBC: 5.3 10*3/uL (ref 4.0–10.5)

## 2014-02-11 LAB — RAPID URINE DRUG SCREEN, HOSP PERFORMED
Amphetamines: NOT DETECTED
Barbiturates: NOT DETECTED
Benzodiazepines: NOT DETECTED
Cocaine: NOT DETECTED
Opiates: NOT DETECTED
Tetrahydrocannabinol: NOT DETECTED

## 2014-02-11 LAB — I-STAT TROPONIN, ED: Troponin i, poc: 0 ng/mL (ref 0.00–0.08)

## 2014-02-11 LAB — I-STAT CHEM 8, ED
BUN: 35 mg/dL — ABNORMAL HIGH (ref 6–23)
CHLORIDE: 102 meq/L (ref 96–112)
CREATININE: 1.3 mg/dL — AB (ref 0.50–1.10)
Calcium, Ion: 1.25 mmol/L (ref 1.13–1.30)
Glucose, Bld: 97 mg/dL (ref 70–99)
HCT: 44 % (ref 36.0–46.0)
Hemoglobin: 15 g/dL (ref 12.0–15.0)
Potassium: 4 mEq/L (ref 3.7–5.3)
SODIUM: 137 meq/L (ref 137–147)
TCO2: 25 mmol/L (ref 0–100)

## 2014-02-11 LAB — PROTIME-INR
INR: 1 (ref 0.00–1.49)
PROTHROMBIN TIME: 13.3 s (ref 11.6–15.2)

## 2014-02-11 LAB — APTT: APTT: 29 s (ref 24–37)

## 2014-02-11 LAB — CBG MONITORING, ED: GLUCOSE-CAPILLARY: 76 mg/dL (ref 70–99)

## 2014-02-11 MED ORDER — SENNOSIDES-DOCUSATE SODIUM 8.6-50 MG PO TABS: 1.0000 | ORAL_TABLET | Freq: Every evening | ORAL | Status: DC | PRN

## 2014-02-11 MED ORDER — PANTOPRAZOLE SODIUM 40 MG PO TBEC
40.0000 mg | DELAYED_RELEASE_TABLET | Freq: Every day | ORAL | Status: DC
  Administered 2014-02-12: 40 mg via ORAL
  Filled 2014-02-11: qty 1

## 2014-02-11 MED ORDER — SODIUM CHLORIDE 0.9 % IV SOLN
INTRAVENOUS | Status: DC
  Administered 2014-02-11: 22:00:00 via INTRAVENOUS

## 2014-02-11 MED ORDER — ATORVASTATIN CALCIUM 40 MG PO TABS: 40.0000 mg | ORAL_TABLET | Freq: Every day | ORAL | Status: DC

## 2014-02-11 MED ORDER — ACETAMINOPHEN 325 MG PO TABS: 650.0000 mg | ORAL_TABLET | ORAL | Status: DC | PRN

## 2014-02-11 MED ORDER — ACETAMINOPHEN 650 MG RE SUPP: 650.0000 mg | RECTAL | Status: DC | PRN

## 2014-02-11 MED ORDER — MOMETASONE FURO-FORMOTEROL FUM 100-5 MCG/ACT IN AERO
2.0000 | INHALATION_SPRAY | Freq: Two times a day (BID) | RESPIRATORY_TRACT | Status: DC
  Administered 2014-02-11 – 2014-02-12 (×2): 2 via RESPIRATORY_TRACT
  Filled 2014-02-11: qty 8.8

## 2014-02-11 MED ORDER — ASPIRIN 325 MG PO TABS
325.0000 mg | ORAL_TABLET | Freq: Every day | ORAL | Status: DC
  Administered 2014-02-11 – 2014-02-12 (×2): 325 mg via ORAL
  Filled 2014-02-11 (×2): qty 1

## 2014-02-11 MED ORDER — ASPIRIN 300 MG RE SUPP: 300.0000 mg | Freq: Every day | RECTAL | Status: DC

## 2014-02-11 MED ORDER — HEPARIN SODIUM (PORCINE) 5000 UNIT/ML IJ SOLN
5000.0000 [IU] | Freq: Three times a day (TID) | INTRAMUSCULAR | Status: DC
  Administered 2014-02-11 – 2014-02-12 (×3): 5000 [IU] via SUBCUTANEOUS
  Filled 2014-02-11 (×3): qty 1

## 2014-02-11 MED ORDER — STROKE: EARLY STAGES OF RECOVERY BOOK
Freq: Once | Status: AC
  Administered 2014-02-11: 1
  Filled 2014-02-11: qty 1

## 2014-02-11 NOTE — ED Notes (Signed)
Pt presents with dizziness and right sided facial numbess. NAD noted. Pt is a/o x 4 at this time.

## 2014-02-11 NOTE — ED Provider Notes (Signed)
CSN: [id number]     Arrival date & time 02/11/14  1907 History   First MD Initiated Contact with Patient 02/11/14 1919     Chief Complaint  Patient presents with  . Numbness    An emergency department physician performed an initial assessment on this suspected stroke patient at [id number]. (Consider location/radiation/quality/duration/timing/severity/associated sxs/prior Treatment) HPI Comments: Patient is a 63 year old female with history of hypertension, COPD. She presents today with complaints of numbness to the right side of her face that started acutely at approximately 4:00 while she was at work. She denies any weakness or numbness of her right arm or right leg. She denies any headache. She denies any visual disturbances. Her symptoms are improving somewhat.  The history is provided by the patient.    Past Medical History  Diagnosis Date  . COPD 03/23/2008  . Cough 11/05/2007  . HYPERTENSION 02/10/2007  . MUSCLE CRAMPS 08/09/2009  . OSTEOPOROSIS 02/10/2007  . PEPTIC ULCER DISEASE 03/23/2008  . PNEUMONIA, RIGHT UPPER LOBE 03/23/2008  . RESTLESS LEG SYNDROME 03/23/2008  . Unspecified disorder of thyroid 11/16/2007  . Allergic rhinitis, cause unspecified 08/07/2012  . Other and unspecified hyperlipidemia 08/07/2012   Past Surgical History  Procedure Laterality Date  . Breast biopsy negative  1972   Family History  Problem Relation Age of Onset  . Leukemia Father    History  Substance Use Topics  . Smoking status: Former Games developermoker  . Smokeless tobacco: Not on file     Comment: since 2009  . Alcohol Use: Yes   OB History   Grav Para Term Preterm Abortions TAB SAB Ect Mult Living                 Review of Systems  All other systems reviewed and are negative.     Allergies  Alendronate sodium; Sertraline hcl; and Zyrtec  Home Medications   Prior to Admission medications   Medication Sig Start Date End Date Taking? Authorizing Provider  ALPRAZolam (XANAX) 0.25 MG tablet  Take 0.25 mg by mouth 2 (two) times daily as needed for anxiety or sleep.   Yes Historical Provider, MD  aspirin 325 MG tablet Take 325 mg by mouth daily. 02/11/14  Yes Historical Provider, MD  Beclomethasone Dipropionate (QNASL) 80 MCG/ACT AERS Place 1 spray into the nose daily.   Yes Historical Provider, MD  Fluticasone-Salmeterol (ADVAIR) 250-50 MCG/DOSE AEPB Inhale 1 puff into the lungs 2 (two) times daily.   Yes Historical Provider, MD  hydrochlorothiazide (HYDRODIURIL) 50 MG tablet Take 50 mg by mouth daily. 07/19/13  Yes Historical Provider, MD  triamterene-hydrochlorothiazide (DYAZIDE) 37.5-25 MG per capsule Take 1 capsule by mouth daily.   Yes Historical Provider, MD  doxycycline (PERIOSTAT) 20 MG tablet Take 20 mg by mouth 2 (two) times daily.  05/18/12   Historical Provider, MD   BP 184/94  Pulse 83  Temp(Src) 98.4 F (36.9 C) (Oral)  SpO2 100% Physical Exam  Nursing note and vitals reviewed. Constitutional: She is oriented to person, place, and time. She appears well-developed and well-nourished. No distress.  HENT:  Head: Normocephalic and atraumatic.  Eyes: EOM are normal. Pupils are equal, round, and reactive to light.  Neck: Normal range of motion. Neck supple.  Cardiovascular: Normal rate and regular rhythm.  Exam reveals no gallop and no friction rub.   No murmur heard. Pulmonary/Chest: Effort normal and breath sounds normal. No respiratory distress. She has no wheezes.  Abdominal: Soft. Bowel sounds are normal. She exhibits no distension.  There is no tenderness.  Musculoskeletal: Normal range of motion.  Neurological: She is alert and oriented to person, place, and time. No cranial nerve deficit. She exhibits normal muscle tone. Coordination normal.  Skin: Skin is warm and dry. She is not diaphoretic.    ED Course  Procedures (including critical care time) Labs Review Labs Reviewed  I-STAT CHEM 8, ED - Abnormal; Notable for the following:    BUN 35 (*)     Creatinine, Ser 1.30 (*)    All other components within normal limits  PROTIME-INR  APTT  CBC  DIFFERENTIAL  COMPREHENSIVE METABOLIC PANEL  CBG MONITORING, ED  I-STAT TROPOININ, ED    Imaging Review Ct Head Wo Contrast  02/11/2014   CLINICAL DATA:  Numbness in right side of face, dizziness. Code stroke.  EXAM: CT HEAD WITHOUT CONTRAST  TECHNIQUE: Contiguous axial images were obtained from the base of the skull through the vertex without intravenous contrast.  COMPARISON:  11/16/2007  FINDINGS: Chronic microvascular changes throughout the deep white matter. No acute intracranial abnormality. Specifically, no hemorrhage, hydrocephalus, mass lesion, acute infarction, or significant intracranial injury. No acute calvarial abnormality. Visualized paranasal sinuses and mastoids clear. Orbital soft tissues unremarkable.  IMPRESSION: Chronic small vessel disease changes. No acute intracranial abnormality.   Electronically Signed   By: Charlett NoseKevin  Dover M.D.   On: 02/11/2014 19:25     EKG Interpretation   Date/Time:  Friday February 11 2014 19:42:16 EDT Ventricular Rate:  82 PR Interval:  170 QRS Duration: 88 QT Interval:  400 QTC Calculation: 467 R Axis:   89 Text Interpretation:  Sinus rhythm Borderline right axis deviation  Baseline wander in lead(s) I II aVR Confirmed by DELOS  MD, Lyriq Finerty  ([id number]) on 02/11/2014 7:48:34 PM      MDM   Final diagnoses:  Stroke    Patient with right sided facial numbness that started several hours pta.  Code stroke was initiated and ct and labs were obtained.  CT negative.  She was evaluated by Dr. Roseanne RenoStewart from neurology who feels as though she is not a candidate for tpa, however is concerned that her symptoms represent some neurologic event, likely a tia.  She will be admitted for workup of this.  Dr. Clyde LundborgNiu from the hospitalist service to admit.    Geoffery Lyonsouglas Malli Falotico, MD 02/12/14 [phone]

## 2014-02-11 NOTE — H&P (Signed)
[facility] History and Physical  Andrea McalpineRhonda J [name] ZOX:[id number] DOB: 03-07-1951 DOA: 02/11/2014  Referring physician: ED physician PCP: Oliver BarreJames John, MD  Specialists:   Chief Complaint: right facial numbness  HPI: Andrea McalpineRhonda J [name] is a 63 y.o. female with PMH of hypertension, COPD, hx of PUD, who presents with numbness to the right side of her face.  Patient reports that at approximately 4:00 while she was at work, she started feeling numb over her right face. She does not have weakness, numbness or tingling sensations in her extremities, no vision or hearing change. She has dizziness, but no loss of balance. She does not have nausea, vomiting, abdominal pain, diarrhea. She has mild cough due to COPD, which is at her baseline. No chest pain, fever or chills. Her facial numbness subsided at arrival to ED, but still feels mildly numb.   CT head is negative for acute abnormalities. Patient is admitted to the inpatient for further evaluation and treatment.   Review of Systems: As presented in the history of presenting illness, rest negative.  Where does patient live? Lives at home Can patient participate in ADLs? Yes  Allergy:  Allergies  Allergen Reactions  . Alendronate Sodium Nausea Only    GI upset  . Zyrtec [Cetirizine] Itching, Rash and Other (See Comments)    Tongue burning, thrush  . Sertraline Hcl Other (See Comments)    unknown    Past Medical History  Diagnosis Date  . COPD 03/23/2008  . Cough 11/05/2007  . HYPERTENSION 02/10/2007  . MUSCLE CRAMPS 08/09/2009  . OSTEOPOROSIS 02/10/2007  . PEPTIC ULCER DISEASE 03/23/2008  . PNEUMONIA, RIGHT UPPER LOBE 03/23/2008  . RESTLESS LEG SYNDROME 03/23/2008  . Unspecified disorder of thyroid 11/16/2007  . Allergic rhinitis, cause unspecified 08/07/2012  . Other and unspecified hyperlipidemia 08/07/2012    Past Surgical History  Procedure Laterality Date  . Breast biopsy negative  1972    Social History:  reports that she  has quit smoking. She does not have any smokeless tobacco history on file. She reports that she drinks alcohol. She reports that she does not use illicit drugs.  Family History:  Family History  Problem Relation Age of Onset  . Leukemia Father      Prior to Admission medications   Medication Sig Start Date End Date Taking? Authorizing Provider  doxycycline (PERIOSTAT) 20 MG tablet Take 20 mg by mouth daily.  05/18/12  Yes Historical Provider, MD  Fluticasone-Salmeterol (ADVAIR) 250-50 MCG/DOSE AEPB Inhale 1 puff into the lungs 2 (two) times daily as needed.    Yes Historical Provider, MD  hydrochlorothiazide (HYDRODIURIL) 50 MG tablet Take 50 mg by mouth daily. 07/19/13  Yes Historical Provider, MD    Physical Exam: Filed Vitals:   02/11/14 2000 02/11/14 2000 02/11/14 2039 02/11/14 2124  BP: 167/98  167/98 156/89  Pulse: 81  96 78  Temp:  98.4 F (36.9 C)    TempSrc:    Oral  Resp: 16  21 20   Height:    5\' 5"  (1.651 m)  Weight:    50.621 kg (111 lb 9.6 oz)  SpO2: 98%  100% 100%   General: Not in acute distress HEENT:       Eyes: PERRL, EOMI, no scleral icterus       ENT: No discharge from the ears and nose, no pharynx injection, no tonsillar enlargement.        Neck: No JVD, no bruit, no mass felt. Cardiac: S1/S2, RRR, No murmurs, gallops  or rubs Pulm: Good air movement bilaterally. Clear to auscultation bilaterally. No rales, wheezing, rhonchi or rubs. Abd: Soft, nondistended, nontender, no rebound pain, no organomegaly, BS present Ext: No edema. 2+DP/PT pulse bilaterally Musculoskeletal: No joint deformities, erythema, or stiffness, ROM full Skin: No rashes.  Neuro: Alert and oriented X3, cranial nerves II-XII grossly intact except slightly decreased sensation over the right face. Muscle strength 5/5 in all extremeties, sensation to light touch intact. Brachial reflex 2+ bilaterally. Knee reflex 1+ bilaterally. Negative Babinski's sign. Normal finger to nose test. Psych:  Patient is not psychotic, no suicidal or hemocidal ideation.  Labs on Admission:  Basic Metabolic Panel:  Recent Labs Lab 02/11/14 1912 02/11/14 1919  NA 139 137  K 4.2 4.0  CL 98 102  CO2 27  --   GLUCOSE 94 97  BUN 35* 35*  CREATININE 1.29* 1.30*  CALCIUM 10.4  --    Liver Function Tests:  Recent Labs Lab 02/11/14 1912  AST 22  ALT 13  ALKPHOS 59  BILITOT 0.5  PROT 8.6*  ALBUMIN 4.4   No results found for this basename: LIPASE, AMYLASE,  in the last 168 hours No results found for this basename: AMMONIA,  in the last 168 hours CBC:  Recent Labs Lab 02/11/14 1912 02/11/14 1919  WBC 5.3  --   NEUTROABS 3.9  --   HGB 13.5 15.0  HCT 38.9 44.0  MCV 96.3  --   PLT 255  --    Cardiac Enzymes: No results found for this basename: CKTOTAL, CKMB, CKMBINDEX, TROPONINI,  in the last 168 hours  BNP (last 3 results) No results found for this basename: PROBNP,  in the last 8760 hours CBG:  Recent Labs Lab 02/11/14 1929  GLUCAP 76    Radiological Exams on Admission: Ct Head Wo Contrast  02/11/2014   CLINICAL DATA:  Numbness in right side of face, dizziness. Code stroke.  EXAM: CT HEAD WITHOUT CONTRAST  TECHNIQUE: Contiguous axial images were obtained from the base of the skull through the vertex without intravenous contrast.  COMPARISON:  11/16/2007  FINDINGS: Chronic microvascular changes throughout the deep white matter. No acute intracranial abnormality. Specifically, no hemorrhage, hydrocephalus, mass lesion, acute infarction, or significant intracranial injury. No acute calvarial abnormality. Visualized paranasal sinuses and mastoids clear. Orbital soft tissues unremarkable.  IMPRESSION: Chronic small vessel disease changes. No acute intracranial abnormality.   Electronically Signed   By: Charlett NoseKevin  Dover M.D.   On: 02/11/2014 19:25    EKG: Independently reviewed.   Assessment/Plan Principal Problem:   TIA (transient ischemic attack) Active Problems:   Essential  hypertension   COPD (chronic obstructive pulmonary disease)   Peptic ulcer  1. TIA/stroke: Patient's symptoms are likely caused by TIA or stroke. Neurology was consulted, and recommended stroke workup given her multiple risk factors for stroke,  - will admit to Tele bed - appreciate Neuro consultation - start lipitor - follow up neuro's Recs as follows: 1. HgbA1c, fasting lipid panel 2. MRI, MRA  of the brain without contrast 3. PT consult, OT consult 4. Echocardiogram 5. Carotid dopplers 6. Prophylactic therapy-Antiplatelet med: Aspirin    2. HTN:  On HCTZ at home. - will hold HCTZ in the setting of acute stroke or TIA  3. COPD: Stable, no signs of acute exacerbation. Lung auscultation is clear bilaterally. -Continue dulera  4. Hx of PUD: Stable, not on medications - start Protonix to prevent stress ulcers  DVT ppx: SQ Heparin  Code Status: Full code  Family Communication: None at bed side.   Disposition Plan: Admit to inpatient   Date of Service 02/11/2014    Lorretta Harp [facility] Pager [phone]  If 7PM-7AM, please contact night-coverage [email or web address] Password [facility] 02/11/2014, 11:49 PM

## 2014-02-11 NOTE — Code Documentation (Signed)
63 [facility] for Rt facial numbness & tingling and dizziness.  Symptoms began at 1600 while she was at work.  [place] Her initial BP was 168/102 for EMS, later 184/94 in ED.  NIH 1 for sensory deficit to Rt face.  No acute treatment due to mild symptoms.  tPA window ends at 2030.

## 2014-02-11 NOTE — Consult Note (Signed)
Referring Physician: Judd LienELO, D    Chief Complaint: Acute onset of numbness involving right face, and dizziness.  HPI: Andrea McalpineRhonda J [name] is an 63 y.o. female Mr. hypertension, COPD, restless leg syndrome and hyperlipidemia, presenting with new onset numbness involving the right side of her face as well as dizziness. She was last known well at 4 PM this afternoon. There is no history of stroke nor TIA. She has not been on antiplatelet therapy. CT scan of her head showed no acute intracranial abnormality. NIH stroke score was 1 for sensory changes involving right side of the face.  LSN: 4 PM on 02/11/2014 tPA Given: No: Minimal deficits mRankin:  Past Medical History  Diagnosis Date  . COPD 03/23/2008  . Cough 11/05/2007  . HYPERTENSION 02/10/2007  . MUSCLE CRAMPS 08/09/2009  . OSTEOPOROSIS 02/10/2007  . PEPTIC ULCER DISEASE 03/23/2008  . PNEUMONIA, RIGHT UPPER LOBE 03/23/2008  . RESTLESS LEG SYNDROME 03/23/2008  . Unspecified disorder of thyroid 11/16/2007  . Allergic rhinitis, cause unspecified 08/07/2012  . Other and unspecified hyperlipidemia 08/07/2012    Family History  Problem Relation Age of Onset  . Leukemia Father      Medications: I have reviewed the patient's current medications.  ROS: History obtained from the patient  General ROS: negative for - chills, fatigue, fever, night sweats, weight gain or weight loss Psychological ROS: negative for - behavioral disorder, hallucinations, memory difficulties, mood swings or suicidal ideation Ophthalmic ROS: negative for - blurry vision, double vision, eye pain or loss of vision ENT ROS: negative for - epistaxis, nasal discharge, oral lesions, sore throat, tinnitus or vertigo Allergy and Immunology ROS: negative for - hives or itchy/watery eyes Hematological and Lymphatic ROS: negative for - bleeding problems, bruising or swollen lymph nodes Endocrine ROS: negative for - galactorrhea, hair pattern changes, polydipsia/polyuria or  temperature intolerance Respiratory ROS: negative for - cough, hemoptysis, shortness of breath or wheezing Cardiovascular ROS: negative for - chest pain, dyspnea on exertion, edema or irregular heartbeat Gastrointestinal ROS: negative for - abdominal pain, diarrhea, hematemesis, nausea/vomiting or stool incontinence Genito-Urinary ROS: negative for - dysuria, hematuria, incontinence or urinary frequency/urgency Musculoskeletal ROS: negative for - joint swelling or muscular weakness Neurological ROS: as noted in HPI Dermatological ROS: negative for rash and skin lesion changes  Physical Examination: Blood pressure 184/94, pulse 83, temperature 98.4 F (36.9 C), temperature source Oral, SpO2 100.00%.  Neurologic Examination: Mental Status: Alert, oriented, thought content appropriate.  Speech fluent without evidence of aphasia. Able to follow commands without difficulty. Cranial Nerves: II-Visual fields were normal. III/IV/VI-Pupils were equal and reacted. Extraocular movements were full and conjugate.    V/VII-slight numbness involving right lower face; no facial weakness. VIII-normal. X-normal speech. Motor: 5/5 bilaterally with normal tone and bulk Sensory: Normal except for right side of face. Deep Tendon Reflexes: 2+ and symmetric. Plantars: Flexor bilaterally Cerebellar: Normal finger-to-nose testing.  Ct Head Wo Contrast  02/11/2014   CLINICAL DATA:  Numbness in right side of face, dizziness. Code stroke.  EXAM: CT HEAD WITHOUT CONTRAST  TECHNIQUE: Contiguous axial images were obtained from the base of the skull through the vertex without intravenous contrast.  COMPARISON:  11/16/2007  FINDINGS: Chronic microvascular changes throughout the deep white matter. No acute intracranial abnormality. Specifically, no hemorrhage, hydrocephalus, mass lesion, acute infarction, or significant intracranial injury. No acute calvarial abnormality. Visualized paranasal sinuses and mastoids clear.  Orbital soft tissues unremarkable.  IMPRESSION: Chronic small vessel disease changes. No acute intracranial abnormality.   Electronically Signed  By: Charlett NoseKevin  Dover M.D.   On: 02/11/2014 19:25    Assessment: 63 y.o. female with multiple risk factors for stroke presenting with dizziness and numbness involving right face. Left subcortical TIA or possible small vessel stroke will need to be ruled out, as well as workup to assess overall risk of stroke.  Stroke Risk Factors - hypertension, hyperlipidemia  Plan: 1. HgbA1c, fasting lipid panel 2. MRI, MRA  of the brain without contrast 3. PT consult, OT consult 4. Echocardiogram 5. Carotid dopplers 6. Prophylactic therapy-Antiplatelet med: Aspirin  7. Risk factor modification 8. Telemetry monitoring   C.R. Roseanne RenoStewart, MD [facility] [phone]  02/11/2014, 7:48 PM

## 2014-02-12 ENCOUNTER — Inpatient Hospital Stay (HOSPITAL_COMMUNITY): Payer: BC Managed Care – PPO

## 2014-02-12 DIAGNOSIS — E785 Hyperlipidemia, unspecified: Secondary | ICD-10-CM

## 2014-02-12 DIAGNOSIS — I379 Nonrheumatic pulmonary valve disorder, unspecified: Secondary | ICD-10-CM

## 2014-02-12 DIAGNOSIS — I639 Cerebral infarction, unspecified: Secondary | ICD-10-CM

## 2014-02-12 DIAGNOSIS — K279 Peptic ulcer, site unspecified, unspecified as acute or chronic, without hemorrhage or perforation: Secondary | ICD-10-CM

## 2014-02-12 LAB — BASIC METABOLIC PANEL
Anion gap: 14 (ref 5–15)
BUN: 27 mg/dL — ABNORMAL HIGH (ref 6–23)
CO2: 24 mEq/L (ref 19–32)
CREATININE: 1.13 mg/dL — AB (ref 0.50–1.10)
Calcium: 9.8 mg/dL (ref 8.4–10.5)
Chloride: 100 mEq/L (ref 96–112)
GFR calc Af Amer: 59 mL/min — ABNORMAL LOW (ref >90)
GFR, EST NON AFRICAN AMERICAN: 51 mL/min — AB (ref >90)
GLUCOSE: 82 mg/dL (ref 70–99)
POTASSIUM: 3.7 meq/L (ref 3.7–5.3)
Sodium: 138 mEq/L (ref 137–147)

## 2014-02-12 LAB — LIPID PANEL
CHOL/HDL RATIO: 2.4 ratio
CHOLESTEROL: 217 mg/dL — AB (ref 0–200)
HDL: 92 mg/dL (ref >39)
LDL Cholesterol: 107 mg/dL — ABNORMAL HIGH (ref 0–99)
Triglycerides: 88 mg/dL (ref <150)
VLDL: 18 mg/dL (ref 0–40)

## 2014-02-12 LAB — HEMOGLOBIN A1C
Hgb A1c MFr Bld: 5.7 % — ABNORMAL HIGH (ref <5.7)
Mean Plasma Glucose: 117 mg/dL — ABNORMAL HIGH (ref <117)

## 2014-02-12 MED ORDER — ASPIRIN EC 81 MG PO TBEC: 81.0000 mg | DELAYED_RELEASE_TABLET | Freq: Every day | ORAL | Status: AC

## 2014-02-12 MED ORDER — LORAZEPAM 2 MG/ML IJ SOLN
1.0000 mg | Freq: Once | INTRAMUSCULAR | Status: AC
  Administered 2014-02-12: 1 mg via INTRAVENOUS
  Filled 2014-02-12: qty 1

## 2014-02-12 MED ORDER — ATORVASTATIN CALCIUM 10 MG PO TABS
10.0000 mg | ORAL_TABLET | Freq: Every day | ORAL | Status: DC
  Administered 2014-02-12: 10 mg via ORAL
  Filled 2014-02-12: qty 1

## 2014-02-12 MED ORDER — ATORVASTATIN CALCIUM 10 MG PO TABS: 10.0000 mg | ORAL_TABLET | Freq: Every day | ORAL | Status: DC

## 2014-02-12 NOTE — Progress Notes (Signed)
Pt. Given discharge instructions, along with prescriptions, and reviewed with pt., discharged home accompanied by husband.

## 2014-02-12 NOTE — Progress Notes (Signed)
OT Cancellation Note  Patient Details Name: Andrea McalpineRhonda J [name] MRN: [id number] DOB: [birth year]   Cancelled Treatment:    Reason Eval/Treat Not Completed: Patient at procedure or test/ unavailable (MRI)  Boone MasterJones, Foster Frericks B 02/12/2014, 9:19 AM

## 2014-02-12 NOTE — Progress Notes (Signed)
Patient ID: Andrea McalpineRhonda J [name]  female  [id number]    DOB: February 10, 1951    DOA: 02/11/2014  PCP: Andrea BarreJames John, MD  Brief history of present illness Patient is a 63 year old female with PMH of hypertension, COPD, hx of PUD, who presents with numbness to the right side of her face. Neurology consulted, patient undergoing stroke workup.  Assessment/Plan: Principal Problem:   TIA (transient ischemic attack) - MRI, MRA pending, 2-D echo, carotid Dopplers pending - Lipid panel showed LDL of 107, started on Lipitor - Continue aspirin daily - PT, OT evaluation pending, neurology consult  Active Problems:   Essential hypertension - Hold HCTZ for now    COPD (chronic obstructive pulmonary disease) - Continue dulera    Peptic ulcer disease - Continue Protonix   DVT Prophylaxis:  Code Status:  Family Communication:  Disposition:  Consultants:  Neurology  Procedures:  CT head  Antibiotics:  None    Subjective: Patient seen and examined, right-sided facial numbness has improved, no other focal weakness or facial droop  Objective: Weight change:  No intake or output data in the 24 hours ending 02/12/14 0953 Blood pressure 130/82, pulse 84, temperature 98.3 F (36.8 C), temperature source Oral, resp. rate 18, height 5\' 5"  (1.651 m), weight 50.621 kg (111 lb 9.6 oz), SpO2 98.00%.  Physical Exam: General: Alert and awake, oriented x3, not in any acute distress. CVS: S1-S2 clear, no murmur rubs or gallops Chest: clear to auscultation bilaterally, no wheezing, rales or rhonchi Abdomen: soft nontender, nondistended, normal bowel sounds  Extremities: no cyanosis, clubbing or edema noted bilaterally Neuro: Cranial nerves II-XII intact, no focal neurological deficits  Lab Results: Basic Metabolic Panel:  Recent Labs Lab 02/11/14 1912 02/11/14 1919 02/12/14 0525  NA 139 137 138  K 4.2 4.0 3.7  CL 98 102 100  CO2 27  --  24  GLUCOSE 94 97 82  BUN 35* 35* 27*    CREATININE 1.29* 1.30* 1.13*  CALCIUM 10.4  --  9.8   Liver Function Tests:  Recent Labs Lab 02/11/14 1912  AST 22  ALT 13  ALKPHOS 59  BILITOT 0.5  PROT 8.6*  ALBUMIN 4.4   No results found for this basename: LIPASE, AMYLASE,  in the last 168 hours No results found for this basename: AMMONIA,  in the last 168 hours CBC:  Recent Labs Lab 02/11/14 1912 02/11/14 1919  WBC 5.3  --   NEUTROABS 3.9  --   HGB 13.5 15.0  HCT 38.9 44.0  MCV 96.3  --   PLT 255  --    Cardiac Enzymes: No results found for this basename: CKTOTAL, CKMB, CKMBINDEX, TROPONINI,  in the last 168 hours BNP: No components found with this basename: POCBNP,  CBG:  Recent Labs Lab 02/11/14 1929  GLUCAP 76     Micro Results: No results found for this or any previous visit (from the past 240 hour(s)).  Studies/Results: Ct Head Wo Contrast  02/11/2014   CLINICAL DATA:  Numbness in right side of face, dizziness. Code stroke.  EXAM: CT HEAD WITHOUT CONTRAST  TECHNIQUE: Contiguous axial images were obtained from the base of the skull through the vertex without intravenous contrast.  COMPARISON:  11/16/2007  FINDINGS: Chronic microvascular changes throughout the deep white matter. No acute intracranial abnormality. Specifically, no hemorrhage, hydrocephalus, mass lesion, acute infarction, or significant intracranial injury. No acute calvarial abnormality. Visualized paranasal sinuses and mastoids clear. Orbital soft tissues unremarkable.  IMPRESSION: Chronic small vessel disease changes.  No acute intracranial abnormality.   Electronically Signed   By: Charlett NoseKevin  Dover M.D.   On: 02/11/2014 19:25    Medications: Scheduled Meds: . aspirin  300 mg Rectal Daily   Or  . aspirin  325 mg Oral Daily  . atorvastatin  10 mg Oral q1800  . heparin  5,000 Units Subcutaneous 3 times per day  . LORazepam  1 mg Intravenous Once  . mometasone-formoterol  2 puff Inhalation BID  . pantoprazole  40 mg Oral Q1200       LOS: 1 day   Andrea [name] M.D. [facility] 02/12/2014, 9:53 AM Pager: [phone]  If 7PM-7AM, please contact night-coverage [email or web address] Password [email or web address]

## 2014-02-12 NOTE — Evaluation (Signed)
Occupational Therapy Evaluation Patient Details Name: Ted McalpineRhonda J Hornig MRN: [id number] DOB: 02-29-1951 Today's Date: 02/12/2014    History of Present Illness 63 yo female admitted with R face numbness and dizziness. BP 168/102 on admit. NIH 1   Clinical Impression   Patient evaluated by Occupational Therapy with no further acute OT needs identified. All education has been completed and the patient has no further questions. See below for any follow-up Occupational Therapy or equipment needs. OT to sign off. Thank you for referral.      Follow Up Recommendations  No OT follow up    Equipment Recommendations  None recommended by OT    Recommendations for Other Services       Precautions / Restrictions Precautions Precautions: None      Mobility Bed Mobility Overal bed mobility: Independent                Transfers Overall transfer level: Independent                    Balance Overall balance assessment:  (pt with slight drift with hall ambulation questions ativan)                                          ADL Overall ADL's : Modified independent                                       General ADL Comments: pt completed sink level task opening all containers, toilet transfer, picked object off floor, stairs x6, tub transfer, occluding vision for 10 seconds without LOB and locating room after ~150 ft ambulation     Vision                     Perception     Praxis      Pertinent Vitals/Pain Pain Assessment: No/denies pain     Hand Dominance Right   Extremity/Trunk Assessment Upper Extremity Assessment Upper Extremity Assessment: Overall WFL for tasks assessed   Lower Extremity Assessment Lower Extremity Assessment: Overall WFL for tasks assessed   Cervical / Trunk Assessment Cervical / Trunk Assessment: Normal   Communication Communication Communication: No difficulties   Cognition  Arousal/Alertness: Awake/alert Behavior During Therapy: WFL for tasks assessed/performed Overall Cognitive Status: Within Functional Limits for tasks assessed                     General Comments       Exercises       Shoulder Instructions      Home Living Family/patient expects to be discharged to:: Private residence Living Arrangements: Spouse/significant other   Type of Home: House Home Access: Stairs to enter Secretary/administratorntrance Stairs-Number of Steps: 5 Entrance Stairs-Rails: Right Home Layout: Two level Alternate Level Stairs-Number of Steps: 6   Bathroom Shower/Tub: Chief Strategy OfficerTub/shower unit   Bathroom Toilet: Standard     Home Equipment: None   Additional Comments: husband has DME for gout flares but it is not the patients DME.       Prior Functioning/Environment Level of Independence: Independent             OT Diagnosis:     OT Problem List:     OT Treatment/Interventions:      OT Goals(Current goals can be  found in the care plan section)    OT Frequency:     Barriers to D/C:            Co-evaluation              End of Session Equipment Utilized During Treatment: Gait belt Nurse Communication: Mobility status;Precautions  Activity Tolerance: Patient tolerated treatment well Patient left: in bed;with call bell/phone within reach   Time: 1312-1325 OT Time Calculation (min): 13 min Charges:  OT General Charges $OT Visit: 1 Procedure OT Evaluation $Initial OT Evaluation Tier I: 1 Procedure OT Treatments $Self Care/Home Management : 8-22 mins G-Codes:    Harolyn RutherfordJones, Tony Granquist B 02/12/2014, 1:29 PM Pager: [phone]

## 2014-02-12 NOTE — Progress Notes (Signed)
SLP Cancellation Note  Patient Details Name: Andrea [name] MRN: [id number] DOB: [birth year]   Cancelled treatment:       Reason Eval/Treat Not Completed: SLP screened, no needs identified, will sign off. Facial numbness resolved. Please reconsult if needs arise.  Celia B. Oak GroveBueche, Rex HospitalMSP, CCC-SLP [phone]  Leigh AuroraBueche, Celia Brown 02/12/2014, 12:07 PM

## 2014-02-12 NOTE — Progress Notes (Signed)
Arrived on unit approx 2100 hrs, rm 4N25, A&O, no obvious distress, no deficits only minor numbness to right side of face, denies any pain, MD/Admissions notified, pt oriented to room and equipment.

## 2014-02-12 NOTE — Progress Notes (Signed)
  Echocardiogram 2D Echocardiogram has been performed.  Andrea [name], Andrea [name] 02/12/2014, 11:26 AM

## 2014-02-12 NOTE — Discharge Summary (Signed)
Physician Discharge Summary  Patient ID: Andrea [name] MRN: [id number] DOB/AGE: 1950-12-13 63 y.o.  Admit date: 02/11/2014 Discharge date: 02/12/2014  Primary Care Physician:  Oliver BarreJames John, MD  Discharge Diagnoses:   . TIA (transient ischemic attack) . COPD (chronic obstructive pulmonary disease) . Essential hypertension . Peptic ulcer . hyperlipidemia   Consults:  Neurology, Dr Roseanne RenoStewart   Recommendations for Outpatient Follow-up:    Allergies:   Allergies  Allergen Reactions  . Alendronate Sodium Nausea Only    GI upset  . Zyrtec [Cetirizine] Itching, Rash and Other (See Comments)    Tongue burning, thrush  . Sertraline Hcl Other (See Comments)    unknown     Discharge Medications:   Medication List         aspirin EC 81 MG tablet  Take 1 tablet (81 mg total) by mouth daily.     atorvastatin 10 MG tablet  Commonly known as:  LIPITOR  Take 1 tablet (10 mg total) by mouth at bedtime.     doxycycline 20 MG tablet  Commonly known as:  PERIOSTAT  Take 20 mg by mouth daily.     Fluticasone-Salmeterol 250-50 MCG/DOSE Aepb  Commonly known as:  ADVAIR  Inhale 1 puff into the lungs 2 (two) times daily as needed.     hydrochlorothiazide 50 MG tablet  Commonly known as:  HYDRODIURIL  Take 50 mg by mouth daily.         Brief H and P: For complete details please refer to admission H and P, but in brief Andrea [name] is a 63 y.o. female with PMH of hypertension, COPD, hx of PUD, who presents with numbness to the right side of her face.  Patient reported that at approximately 4:00 pm while she was at work, she started feeling numb over her right face. She did not have weakness, numbness or tingling sensations in her extremities, no vision or hearing change. She had dizziness, but no loss of balance. She did not have nausea, vomiting, abdominal pain, diarrhea. She had mild cough due to COPD, which is at her baseline. No chest pain, fever or chills. Her facial  numbness subsided at arrival to ED, but still feels mildly numb.  CT head was negative for acute abnormalities.   Hospital Course:    TIA (transient ischemic attack) : symptoms completely resolved. - MRI, MRA was negative for acute CVA,no major vessel stenosis or occlusion.  2-D echo showed EF 50-55%, no wall motion abnormalities, no PFO,  carotid Dopplers showed 1-39% right and left ICA stenosis  Lipid panel showed LDL of 107, started on Lipitor  Placed on  Aspirin 81 mg daily   PT, OT evaluation showed patient is at baseline. HbA1C 5.7  Essential hypertension  - continue HCTZ  COPD (chronic obstructive pulmonary disease)  - Continue dulera   Peptic ulcer disease  - Continue Protonix     Day of Discharge BP 120/89  Pulse 77  Temp(Src) 97.9 F (36.6 C) (Axillary)  Resp 20  Ht 5\' 5"  (1.651 m)  Wt 50.621 kg (111 lb 9.6 oz)  BMI 18.57 kg/m2  SpO2 100%  Physical Exam: General: Alert and awake oriented x3 not in any acute distress. HEENT: anicteric sclera, pupils reactive to light and accommodation CVS: S1-S2 clear no murmur rubs or gallops Chest: clear to auscultation bilaterally, no wheezing rales or rhonchi Abdomen: soft nontender, nondistended, normal bowel sounds Extremities: no cyanosis, clubbing or edema noted bilaterally Neuro: Cranial nerves II-XII intact, no  focal neurological deficits   The results of significant diagnostics from this hospitalization (including imaging, microbiology, ancillary and laboratory) are listed below for reference.    LAB RESULTS: Basic Metabolic Panel:  Recent Labs Lab 02/11/14 1912 02/11/14 1919 02/12/14 0525  NA 139 137 138  K 4.2 4.0 3.7  CL 98 102 100  CO2 27  --  24  GLUCOSE 94 97 82  BUN 35* 35* 27*  CREATININE 1.29* 1.30* 1.13*  CALCIUM 10.4  --  9.8   Liver Function Tests:  Recent Labs Lab 02/11/14 1912  AST 22  ALT 13  ALKPHOS 59  BILITOT 0.5  PROT 8.6*  ALBUMIN 4.4   No results found for this  basename: LIPASE, AMYLASE,  in the last 168 hours No results found for this basename: AMMONIA,  in the last 168 hours CBC:  Recent Labs Lab 02/11/14 1912 02/11/14 1919  WBC 5.3  --   NEUTROABS 3.9  --   HGB 13.5 15.0  HCT 38.9 44.0  MCV 96.3  --   PLT 255  --    Cardiac Enzymes: No results found for this basename: CKTOTAL, CKMB, CKMBINDEX, TROPONINI,  in the last 168 hours BNP: No components found with this basename: POCBNP,  CBG:  Recent Labs Lab 02/11/14 1929  GLUCAP 76    Significant Diagnostic Studies:  Ct Head Wo Contrast  02/11/2014   CLINICAL DATA:  Numbness in right side of face, dizziness. Code stroke.  EXAM: CT HEAD WITHOUT CONTRAST  TECHNIQUE: Contiguous axial images were obtained from the base of the skull through the vertex without intravenous contrast.  COMPARISON:  11/16/2007  FINDINGS: Chronic microvascular changes throughout the deep white matter. No acute intracranial abnormality. Specifically, no hemorrhage, hydrocephalus, mass lesion, acute infarction, or significant intracranial injury. No acute calvarial abnormality. Visualized paranasal sinuses and mastoids clear. Orbital soft tissues unremarkable.  IMPRESSION: Chronic small vessel disease changes. No acute intracranial abnormality.   Electronically Signed   By: Charlett NoseKevin  Dover M.D.   On: 02/11/2014 19:25   Mr Brain Wo Contrast  02/12/2014   CLINICAL DATA:  New onset of numbness in the right side of her face. New onset of dizziness.  EXAM: MRI HEAD WITHOUT CONTRAST  MRA HEAD WITHOUT CONTRAST  TECHNIQUE: Multiplanar, multiecho pulse sequences of the brain and surrounding structures were obtained without intravenous contrast. Angiographic images of the head were obtained using MRA technique without contrast.  COMPARISON:  CT head 02/11/2014  FINDINGS: MRI HEAD FINDINGS  There is some susceptibility artifact due to attachments of a hair piece. The diffusion-weighted images demonstrate no evidence for acute or  subacute infarction.  The ventricles are of normal size. No significant extraaxial fluid collection is present. Moderate periventricular and subcortical white matter changes are evident bilaterally. No acute hemorrhage or mass lesion is evident.  Flow is present in the major intracranial arteries. The vertebrobasilar system is small with fetal type posterior cerebral arteries bilaterally.  Globes and orbits are intact. The paranasal sinuses are clear. The right mastoid air cells are clear. There is some fluid in the left mastoid air cells. No obstructing nasopharyngeal lesion is evident.  MRA HEAD FINDINGS  The internal carotid arteries are within normal limits from the high cervical segments through the ICA termini bilaterally. The A1 and M1 segments are normal. The anterior communicating artery is patent. The MCA bifurcations are intact. There is moderate attenuation of MCA branch vessels bilaterally.  The vertebral arteries are small. The left vertebral artery is  dominant. The PICA origin is visualized and normal. The right vertebral artery terminates at the PICA. The basilar artery is small. Basilar artery terminates at the superior cerebellar arteries. The posterior cerebral arteries are of fetal type bilaterally. There is some attenuation of distal PCA branch vessels.  IMPRESSION: 1. No acute intracranial abnormality or focal lesion to explain the patient's acute symptoms. 2. Moderate generalized periventricular and subcortical white matter disease bilaterally. This is nonspecific, but likely reflects the sequela of chronic microvascular ischemia. 3. Mild to moderate distal small vessel disease is evident on the MRA. This may reflect atherosclerotic change with or vasculitis. 4. Fetal type posterior cerebral arteries bilaterally, a normal variant.   Electronically Signed   By: Gennette Pac M.D.   On: 02/12/2014 11:10   Mr Maxine Glenn Head/brain Wo Cm  02/12/2014   CLINICAL DATA:  New onset of numbness in the  right side of her face. New onset of dizziness.  EXAM: MRI HEAD WITHOUT CONTRAST  MRA HEAD WITHOUT CONTRAST  TECHNIQUE: Multiplanar, multiecho pulse sequences of the brain and surrounding structures were obtained without intravenous contrast. Angiographic images of the head were obtained using MRA technique without contrast.  COMPARISON:  CT head 02/11/2014  FINDINGS: MRI HEAD FINDINGS  There is some susceptibility artifact due to attachments of a hair piece. The diffusion-weighted images demonstrate no evidence for acute or subacute infarction.  The ventricles are of normal size. No significant extraaxial fluid collection is present. Moderate periventricular and subcortical white matter changes are evident bilaterally. No acute hemorrhage or mass lesion is evident.  Flow is present in the major intracranial arteries. The vertebrobasilar system is small with fetal type posterior cerebral arteries bilaterally.  Globes and orbits are intact. The paranasal sinuses are clear. The right mastoid air cells are clear. There is some fluid in the left mastoid air cells. No obstructing nasopharyngeal lesion is evident.  MRA HEAD FINDINGS  The internal carotid arteries are within normal limits from the high cervical segments through the ICA termini bilaterally. The A1 and M1 segments are normal. The anterior communicating artery is patent. The MCA bifurcations are intact. There is moderate attenuation of MCA branch vessels bilaterally.  The vertebral arteries are small. The left vertebral artery is dominant. The PICA origin is visualized and normal. The right vertebral artery terminates at the PICA. The basilar artery is small. Basilar artery terminates at the superior cerebellar arteries. The posterior cerebral arteries are of fetal type bilaterally. There is some attenuation of distal PCA branch vessels.  IMPRESSION: 1. No acute intracranial abnormality or focal lesion to explain the patient's acute symptoms. 2. Moderate  generalized periventricular and subcortical white matter disease bilaterally. This is nonspecific, but likely reflects the sequela of chronic microvascular ischemia. 3. Mild to moderate distal small vessel disease is evident on the MRA. This may reflect atherosclerotic change with or vasculitis. 4. Fetal type posterior cerebral arteries bilaterally, a normal variant.   Electronically Signed   By: Gennette Pac M.D.   On: 02/12/2014 11:10    2D ECHO: Study Conclusions  - Left ventricle: The cavity size was normal. Wall thickness was normal. Systolic function was normal. The estimated ejection fraction was in the range of 50% to 55%. Wall motion was normal; there were no regional wall motion abnormalities. Doppler parameters are consistent with abnormal left ventricular relaxation (grade 1 diastolic dysfunction). - Mitral valve: Mildly thickened leaflets . - Right ventricle: Systolic function was mildly reduced. - Atrial septum: No defect or patent  foramen ovale was identified. - Tricuspid valve: There was mild regurgitation. - Pulmonic valve: There was mild regurgitation   Disposition and Follow-up: Discharge Instructions   Ambulatory referral to Neurology    Complete by:  As directed   Follow up in 4 weeks     Diet - low sodium heart healthy    Complete by:  As directed      Increase activity slowly    Complete by:  As directed             DISPOSITION home  DIET: heart healthy     DISCHARGE FOLLOW-UP Follow-up Information   Follow up with Oliver Barre, MD. Schedule an appointment as soon as possible for a visit in 2 weeks. (for hospital follow-up)    Specialties:  Internal Medicine, Radiology   Contact information:   [address] [place] [address] [phone]       Follow up with SETHI,PRAMOD, MD In 2 months. (stroke clinic)    Specialties:  Neurology, Radiology   Contact information:   [address] [place] [address] [phone]        Time spent on Discharge:  Signed:   Dailen Mcclish M.D. [facility] 02/12/2014, 5:33 PM Pager: [phone]

## 2014-02-12 NOTE — Progress Notes (Signed)
STROKE TEAM PROGRESS NOTE   HISTORY Andrea [name] is an 63 y.o. Female with history of. hypertension, COPD, restless leg syndrome and hyperlipidemia, presenting with new onset numbness involving the right side of her face as well as dizziness. She was last known well at 4 PM this afternoon. There is no history of stroke nor TIA. She has not been on antiplatelet therapy. CT scan of her head showed no acute intracranial abnormality. NIH stroke score was 1 for sensory changes involving right side of the face.  LSN: 4 PM on 02/11/2014  tPA Given: No: Minimal deficits  mRankin:    SUBJECTIVE (INTERVAL HISTORY) No family members present today. The patient feels she is back to baseline. Dr. Pearlean BrownieSethi discussed the results of her MRI with the patient. He feels this event represented a TIA. Risk factors were discussed as well as the potential for further TIAs and possible stroke. Probable discharge later today if workup complete.  OBJECTIVE Temp:  [97.9 F (36.6 C)-98.8 F (37.1 C)] 97.9 F (36.6 C) (10/24 1007) Pulse Rate:  [67-96] 83 (10/24 1007) Cardiac Rhythm:  [-]  Resp:  [15-21] 20 (10/24 1007) BP: (130-184)/(78-98) 137/87 mmHg (10/24 1007) SpO2:  [98 %-100 %] 100 % (10/24 1122) Weight:  [111 lb 9.6 oz (50.621 kg)] 111 lb 9.6 oz (50.621 kg) (10/23 2124)   Recent Labs Lab 02/11/14 1929  GLUCAP 76    Recent Labs Lab 02/11/14 1912 02/11/14 1919 02/12/14 0525  NA 139 137 138  K 4.2 4.0 3.7  CL 98 102 100  CO2 27  --  24  GLUCOSE 94 97 82  BUN 35* 35* 27*  CREATININE 1.29* 1.30* 1.13*  CALCIUM 10.4  --  9.8    Recent Labs Lab 02/11/14 1912  AST 22  ALT 13  ALKPHOS 59  BILITOT 0.5  PROT 8.6*  ALBUMIN 4.4    Recent Labs Lab 02/11/14 1912 02/11/14 1919  WBC 5.3  --   NEUTROABS 3.9  --   HGB 13.5 15.0  HCT 38.9 44.0  MCV 96.3  --   PLT 255  --    No results found for this basename: CKTOTAL, CKMB, CKMBINDEX, TROPONINI,  in the last 168 hours  Recent Labs  02/11/14 1912  LABPROT 13.3  INR 1.00   No results found for this basename: COLORURINE, APPERANCEUR, LABSPEC, PHURINE, GLUCOSEU, HGBUR, BILIRUBINUR, KETONESUR, PROTEINUR, UROBILINOGEN, NITRITE, LEUKOCYTESUR,  in the last 72 hours     Component Value Date/Time   CHOL 217* 02/12/2014 0525   TRIG 88 02/12/2014 0525   HDL 92 02/12/2014 0525   CHOLHDL 2.4 02/12/2014 0525   VLDL 18 02/12/2014 0525   LDLCALC 107* 02/12/2014 0525   No results found for this basename: HGBA1C      Component Value Date/Time   LABOPIA NONE DETECTED 02/11/2014 2210   COCAINSCRNUR NONE DETECTED 02/11/2014 2210   LABBENZ NONE DETECTED 02/11/2014 2210   AMPHETMU NONE DETECTED 02/11/2014 2210   THCU NONE DETECTED 02/11/2014 2210   LABBARB NONE DETECTED 02/11/2014 2210    No results found for this basename: ETH,  in the last 168 hours  Ct Head Wo Contrast 02/11/2014    Chronic small vessel disease changes. No acute intracranial abnormality.     MRI / MRA Head 02/12/2014 1. No acute intracranial abnormality or focal lesion to explain the patient's acute symptoms.  2. Moderate generalized periventricular and subcortical white matter disease bilaterally. This is nonspecific, but likely reflects the sequela of chronic microvascular  ischemia.  3. Mild to moderate distal small vessel disease is evident on the MRA. This may reflect atherosclerotic change with or vasculitis.  4. Fetal type posterior cerebral arteries bilaterally, a normal variant.      PHYSICAL EXAM Frail middle aged Caucasian lady not in distress.Awake alert. Afebrile. Head is nontraumatic. Neck is supple without bruit. Hearing is normal. Cardiac exam no murmur or gallop. Lungs are clear to auscultation. Distal pulses are well felt. Neurological Exam ;  Awake  Alert oriented x 3. Normal speech and language.eye movements full without nystagmus.fundi were not visualized. Vision acuity and fields appear normal. Hearing is normal. Palatal movements  are normal. Face symmetric. Tongue midline. Normal strength, tone, reflexes and coordination. Normal sensation. Gait deferred. ASSESSMENT/PLAN Andrea [name] is a 63 y.o. female with history of hypertension, COPD, restless leg syndrome, and hyperlipidemia presenting with new onset numbness involving the right side of her face as well as dizziness likely from left brain TIA.Marland Kitchen. She did not receive IV t-PA as her deficits were minimal.  Suspect left brain TIA from  Small vessel disease  MRI/MRA is pending.  MRI  see above  MRA  see above  Carotid Doppler  pending  2D Echo  pending  LDL 107 goal less than 70  HgbA1c pending  Subcutaneous heparin for VTE prophylaxis  General diet with thin liquids  Up with assistance  No antithrombotics prior to admission, now on aspirin 325 mg orally every day  Patient counseled to be compliant with her antithrombotic medications  Risk factor education  Ongoing aggressive risk factor management  Resultant resolution of deficits  Therapy recommendations:  Pending  Disposition:  Pending  Hypertension  Home meds:  Hydrochlorothiazide 50 mg daily - held on admission  Stable  Permissive hypertension (OK if < 220/120) but gradually normalize in 5-7 days  Hyperlipidemia  Home meds:  No lipid lowering medications prior to admission  LDL 107 goal < 70  Now on Lipitor 10 mg daily.  Continue statin at discharge   Other Stroke Risk Factors Advanced age ETOH use   Other Active Problems  Renal insufficiency / dehydration - hydrochlorothiazide held at time of admission.  Other Pertinent History  COPD  Stroke team will sign off Followup Dr. Pearlean BrownieSethi in 4 weeks  Hospital day # 1  Delton Seeavid Rinehuls PA-C [facility] Pager [phone] 02/12/2014, 1:18 PM I have personally examined this patient, reviewed notes, independently viewed imaging studies, participated in medical decision making and plan of care. I have  made any additions or clarifications directly to the above note. Agree with note above.  Delia HeadyPramod Hayleen Clinkscales, MD Medical Director [facility] Pager: [phone] 02/12/2014 3:23 PM     To contact Stroke Continuity provider, please refer to [email or web address]. After hours, contact General Neurology

## 2014-02-12 NOTE — Progress Notes (Addendum)
PT Cancellation Note  Patient Details Name: Ted McalpineRhonda J Drew MRN: [id number] DOB: 01-17-1951   Cancelled Treatment:    Reason Eval/Treat Not Completed: Patient at procedure or test/ unavailable (MRI)  2nd attempt (1048) Pt in room undergoing procedure (?dopplers).    Diontre Harps 02/12/2014, 10:14 AM Pager [phone]

## 2014-02-12 NOTE — Progress Notes (Signed)
VASCULAR LAB PRELIMINARY  PRELIMINARY  PRELIMINARY  PRELIMINARY  Carotid Dopplers completed.   Preliminary report:  1-39% ICA stenosis.  Vertebral artery flow is antegrade.   Abbygael Curtiss, RVT 02/12/2014, 5:00 PM

## 2014-02-12 NOTE — Evaluation (Addendum)
Physical Therapy Evaluation Patient Details Name: Andrea McalpineRhonda J [name] MRN: [id number] DOB: [birth year] Today's Date: 02/12/2014   History of Present Illness  63 yo female admitted with R face numbness and dizziness. BP 168/102 on admit. NIH 1  Clinical Impression  Patient evaluated by Physical Therapy with no further acute PT needs identified. All education regarding mobility and signs and symptoms of stroke has been completed and the patient has no further questions. Pt scored 20 on DGI indicating she is a low risk for falls. See below for any follow-up Physial Therapy or equipment needs. PT is signing off. Thank you for this referral.     Follow Up Recommendations No PT follow up    Equipment Recommendations  None recommended by PT    Recommendations for Other Services       Precautions / Restrictions Precautions Precautions: None      Mobility  Bed Mobility Overal bed mobility: Independent                Transfers Overall transfer level: Independent Equipment used: None                Ambulation/Gait Ambulation/Gait assistance: Modified independent (Device/Increase time) Ambulation Distance (Feet): 400 Feet Assistive device: None Gait Pattern/deviations: Drifts right/left Gait velocity: slightly slower than baseline per pt Gait velocity interpretation: Below normal speed for age/gender General Gait Details: slight drift at times with challenges; see DGI for gt analysis   Stairs Stairs: Yes Stairs assistance: Modified independent (Device/Increase time) Stair Management: One rail Left;Alternating pattern;Forwards Number of Stairs: 3 General stair comments: pt relying on handrail but demo good technique   Wheelchair Mobility    Modified Rankin (Stroke Patients Only) Modified Rankin (Stroke Patients Only) Pre-Morbid Rankin Score: No symptoms Modified Rankin: No symptoms     Balance Overall balance assessment:  (see DGi)                               Standardized Balance Assessment Standardized Balance Assessment : Dynamic Gait Index   Dynamic Gait Index Level Surface: Mild Impairment Change in Gait Speed: Normal Gait with Horizontal Head Turns: Mild Impairment Gait with Vertical Head Turns: Normal Gait and Pivot Turn: Normal Step Over Obstacle: Mild Impairment Step Around Obstacles: Normal Steps: Mild Impairment Total Score: 20       Pertinent Vitals/Pain Pain Assessment: No/denies pain    Home Living Family/patient expects to be discharged to:: Private residence Living Arrangements: Spouse/significant other Available Help at Discharge: Family;Available PRN/intermittently Type of Home: House Home Access: Stairs to enter Entrance Stairs-Rails: Right Entrance Stairs-Number of Steps: 5 Home Layout: Two level Home Equipment: None Additional Comments: husband has cane for gout flares but it is not the patients DME.     Prior Function Level of Independence: Independent               Hand Dominance   Dominant Hand: Right    Extremity/Trunk Assessment   Upper Extremity Assessment: Defer to OT evaluation           Lower Extremity Assessment: Overall WFL for tasks assessed      Cervical / Trunk Assessment: Normal  Communication   Communication: No difficulties  Cognition Arousal/Alertness: Awake/alert Behavior During Therapy: WFL for tasks assessed/performed Overall Cognitive Status: Within Functional Limits for tasks assessed                      General Comments  General comments (skin integrity, edema, etc.): educated pt and husband on signs and symptoms of stroke with "BE FAST"; both verbalized understanding     Exercises        Assessment/Plan    PT Assessment Patent does not need any further PT services  PT Diagnosis Difficulty walking   PT Problem List    PT Treatment Interventions     PT Goals (Current goals can be found in the Care Plan section) Acute Rehab PT  Goals Patient Stated Goal: home today PT Goal Formulation: All assessment and education complete, DC therapy    Frequency     Barriers to discharge        Co-evaluation               End of Session Equipment Utilized During Treatment: Gait belt Activity Tolerance: Patient tolerated treatment well Patient left: in bed;with call bell/phone within reach;with family/visitor present Nurse Communication: Mobility status         Time: [id number] PT Time Calculation (min): 13 min   Charges:   PT Evaluation $Initial PT Evaluation Tier I: 1 Procedure PT Treatments $Gait Training: 8-22 mins   PT G CodesDonell [name]:          Andrea [name], [place]  [phone] 02/12/2014, 3:58 PM

## 2014-02-16 ENCOUNTER — Telehealth: Payer: Self-pay | Admitting: *Deleted

## 2014-02-16 ENCOUNTER — Telehealth: Payer: Self-pay | Admitting: Internal Medicine

## 2014-02-16 NOTE — Telephone Encounter (Signed)
Caller: Andrea [name]/Patient; Phone: [phone]; Reason for Call: Transferred from office scheduler.  [facility].  Asymptomatic.  Scheduled by office staff for hospital follow up 02/24/14 at 1100.  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  Please call back ASAP.

## 2014-02-16 NOTE — Telephone Encounter (Signed)
Left msg on triage yesterday 02/15/14 stating needing to make appt for hosp f/u. Called pt no answer LMOM to call back and speak with the schedulers to make appt...Raechel Chute/lmb

## 2014-02-16 NOTE — Telephone Encounter (Signed)
Ok for note thanks 

## 2014-02-24 ENCOUNTER — Encounter: Payer: Self-pay | Admitting: Internal Medicine

## 2014-02-24 ENCOUNTER — Ambulatory Visit (INDEPENDENT_AMBULATORY_CARE_PROVIDER_SITE_OTHER): Payer: BC Managed Care – PPO | Admitting: Internal Medicine

## 2014-02-24 VITALS — BP 138/88 | HR 92 | Temp 98.3°F | Wt 110.1 lb

## 2014-02-24 DIAGNOSIS — Z0189 Encounter for other specified special examinations: Secondary | ICD-10-CM

## 2014-02-24 DIAGNOSIS — E785 Hyperlipidemia, unspecified: Secondary | ICD-10-CM

## 2014-02-24 DIAGNOSIS — Z23 Encounter for immunization: Secondary | ICD-10-CM

## 2014-02-24 DIAGNOSIS — Z0000 Encounter for general adult medical examination without abnormal findings: Secondary | ICD-10-CM

## 2014-02-24 DIAGNOSIS — G459 Transient cerebral ischemic attack, unspecified: Secondary | ICD-10-CM

## 2014-02-24 MED ORDER — TRIAMTERENE-HCTZ 37.5-25 MG PO TABS: 1.0000 | ORAL_TABLET | Freq: Every day | ORAL | Status: DC

## 2014-02-24 MED ORDER — ALPRAZOLAM 0.25 MG PO TABS: 0.2500 mg | ORAL_TABLET | Freq: Every evening | ORAL | Status: DC | PRN

## 2014-02-24 MED ORDER — ATORVASTATIN CALCIUM 10 MG PO TABS: 10.0000 mg | ORAL_TABLET | Freq: Every day | ORAL | Status: DC

## 2014-02-24 NOTE — Assessment & Plan Note (Signed)
stable overall by history and exam, recent data reviewed with pt, and pt to continue medical treatment as before,  to f/u any worsening symptoms or concerns, cont asa/statin

## 2014-02-24 NOTE — Patient Instructions (Signed)
You had the flu shot today  Please continue all other medications as before, and refills have been done if requested - the xanax  Please have the pharmacy call with any other refills you may need.  Please continue your efforts at being more active, low cholesterol diet, and weight control.  You are otherwise up to date with prevention measures today.  Please keep your appointments with your specialists as you may have planned  No further lab work needed today  Please return in 1 year for your yearly visit, or sooner if needed, with Lab testing done 3-5 days before

## 2014-02-24 NOTE — Progress Notes (Signed)
Subjective:    Patient ID: Andrea [name], female    DOB: 05-29-1950, 63 y.o.   MRN: [id number]  HPI  Here for wellness and f/u;  Overall doing ok;  Pt denies CP, worsening SOB, DOE, wheezing, orthopnea, PND, worsening LE edema, palpitations, dizziness or syncope.  Pt denies neurological change such as new headache, facial or extremity weakness.  Pt denies polydipsia, polyuria, or low sugar symptoms. Pt states overall good compliance with treatment and medications, good tolerability, and has been trying to follow lower cholesterol diet.  Pt denies worsening depressive symptoms, suicidal ideation or panic. No fever, night sweats, wt loss, loss of appetite, or other constitutional symptoms.  Pt states good ability with ADL's, has low fall risk, home safety reviewed and adequate, no other significant changes in hearing or vision, and only occasionally active with exercise. No smoking x 8 yrs.  Was hospd oct 23-24 with right sided facial numbness and possible right arm weakness, lasted 2-3 hrs, MRI head, cartodi duplex neg for acute, echo with EF 50-55%. Pt denies new neurological symptoms such as new headache, or facial or extremity weakness or numbness  Pt denies chest pain, increased sob or doe, wheezing, orthopnea, PND, increased LE swelling, palpitations, dizziness or syncope.   Pt denies polydipsia, polyuria.  For upcoming steroid shots in hand for trigger finger.  Cr slightly elev oct 2015. Past Medical History  Diagnosis Date  . COPD 03/23/2008  . Cough 11/05/2007  . HYPERTENSION 02/10/2007  . MUSCLE CRAMPS 08/09/2009  . OSTEOPOROSIS 02/10/2007  . PEPTIC ULCER DISEASE 03/23/2008  . PNEUMONIA, RIGHT UPPER LOBE 03/23/2008  . RESTLESS LEG SYNDROME 03/23/2008  . Unspecified disorder of thyroid 11/16/2007  . Allergic rhinitis, cause unspecified 08/07/2012  . Other and unspecified hyperlipidemia 08/07/2012   Past Surgical History  Procedure Laterality Date  . Breast biopsy negative  1972    reports  that she has quit smoking. She does not have any smokeless tobacco history on file. She reports that she drinks alcohol. She reports that she does not use illicit drugs. family history includes Leukemia in her father. Allergies  Allergen Reactions  . Alendronate Sodium Nausea Only    GI upset  . Zyrtec [Cetirizine] Itching, Rash and Other (See Comments)    Tongue burning, thrush  . Sertraline Hcl Other (See Comments)    unknown   Current Outpatient Prescriptions on File Prior to Visit  Medication Sig Dispense Refill  . aspirin EC 81 MG tablet Take 1 tablet (81 mg total) by mouth daily. 30 tablet 5  . atorvastatin (LIPITOR) 10 MG tablet Take 1 tablet (10 mg total) by mouth at bedtime. 30 tablet 5  . doxycycline (PERIOSTAT) 20 MG tablet Take 20 mg by mouth daily.     . Fluticasone-Salmeterol (ADVAIR) 250-50 MCG/DOSE AEPB Inhale 1 puff into the lungs 2 (two) times daily as needed.     . hydrochlorothiazide (HYDRODIURIL) 50 MG tablet Take 50 mg by mouth daily.     No current facility-administered medications on file prior to visit.     Review of Systems  Constitutional: Negative for unusual diaphoresis or other sweats  HENT: Negative for ringing in ear Eyes: Negative for double vision or worsening visual disturbance.  Respiratory: Negative for choking and stridor.   Gastrointestinal: Negative for vomiting or other signifcant bowel change Genitourinary: Negative for hematuria or decreased urine volume.  Musculoskeletal: Negative for other MSK pain or swelling Skin: Negative for color change and worsening wound.  Neurological: Negative  for tremors and numbness other than noted  Psychiatric/Behavioral: Negative for decreased concentration or agitation other than above       Objective:   Physical Exam BP 138/88 mmHg  Pulse 92  Temp(Src) 98.3 F (36.8 C) (Oral)  Wt 110 lb 2 oz (49.952 kg)  SpO2 93% VS noted,  Constitutional: Pt is oriented to person, place, and time. Appears  well-developed and well-nourished.  Head: Normocephalic and atraumatic.  Right Ear: External ear normal.  Left Ear: External ear normal.  Nose: Nose normal.  Mouth/Throat: Oropharynx is clear and moist.  Eyes: Conjunctivae and EOM are normal. Pupils are equal, round, and reactive to light.  Neck: Normal range of motion. Neck supple. No JVD present. No tracheal deviation present.  Cardiovascular: Normal rate, regular rhythm, normal heart sounds and intact distal pulses.   Pulmonary/Chest: Effort normal and breath sounds without rales or wheezing  Abdominal: Soft. Bowel sounds are normal. NT. No HSM  Musculoskeletal: Normal range of motion. Exhibits no edema.  Lymphadenopathy:  Has no cervical adenopathy.  Neurological: Pt is alert and oriented to person, place, and time. Pt has normal reflexes. No cranial nerve deficit. Motor grossly intact Skin: Skin is warm and dry. No rash noted.  Psychiatric:  Has normal mood and affect. Behavior is normal.     Assessment & Plan:

## 2014-02-24 NOTE — Progress Notes (Signed)
Pre visit review using our clinic review tool, if applicable. No additional management support is needed unless otherwise documented below in the visit note. 

## 2014-05-24 ENCOUNTER — Other Ambulatory Visit: Payer: Self-pay

## 2014-05-24 DIAGNOSIS — Z1231 Encounter for screening mammogram for malignant neoplasm of breast: Secondary | ICD-10-CM

## 2014-06-27 ENCOUNTER — Ambulatory Visit
Admission: RE | Admit: 2014-06-27 | Discharge: 2014-06-27 | Disposition: A | Discharge status: Home / Self Care | Payer: BLUE CROSS/BLUE SHIELD | Source: Ambulatory Visit

## 2014-06-27 DIAGNOSIS — Z1231 Encounter for screening mammogram for malignant neoplasm of breast: Secondary | ICD-10-CM

## 2014-12-22 ENCOUNTER — Telehealth: Payer: Self-pay | Admitting: Internal Medicine

## 2014-12-22 NOTE — Telephone Encounter (Signed)
Patient Name: DAISHA FILOSA DOB: [birth year] Initial Comment Caller states she has white spots on her arms that itch Nurse Assessment Nurse: Charna Elizabeth, RN, Cathy Date/Time (Eastern Time): 12/22/2014 11:53:55 AM Confirm and document reason for call. If symptomatic, describe symptoms. ---Caller states she developed itchy white spots on her arms about 2 months ago. No fever. Has the patient traveled out of the country within the last 30 days? ---No Does the patient require triage? ---Yes Related visit to physician within the last 2 weeks? ---Yes Does the PT have any chronic conditions? (i.e. diabetes, asthma, etc.) ---Yes List chronic conditions. ---COPD, High Blood Pressure, TIA October 2015 Guidelines Guideline Title Affirmed Question Affirmed Notes Rash or Redness - Localized [1] Severe localized itching AND [2] after 2 days of steroid cream and antihistamines Final Disposition User See PCP When Office is Open (within 3 days) Charna Elizabeth, RN, [employer] Comments Scheduled 11am appointment tomorrow with Dr. Jonny Ruiz. Disagree/Comply: Comply

## 2014-12-23 ENCOUNTER — Encounter: Payer: Self-pay | Admitting: Internal Medicine

## 2014-12-23 ENCOUNTER — Ambulatory Visit (INDEPENDENT_AMBULATORY_CARE_PROVIDER_SITE_OTHER): Payer: BLUE CROSS/BLUE SHIELD | Admitting: Internal Medicine

## 2014-12-23 VITALS — BP 130/86 | HR 85 | Temp 98.2°F | Ht 65.0 in | Wt 108.0 lb

## 2014-12-23 DIAGNOSIS — R21 Rash and other nonspecific skin eruption: Secondary | ICD-10-CM | POA: Diagnosis not present

## 2014-12-23 MED ORDER — KETOCONAZOLE 2 % EX CREA: 1.0000 "application " | TOPICAL_CREAM | Freq: Every day | CUTANEOUS | Status: DC

## 2014-12-23 NOTE — Progress Notes (Signed)
Pre visit review using our clinic review tool, if applicable. No additional management support is needed unless otherwise documented below in the visit note. 

## 2014-12-23 NOTE — Patient Instructions (Addendum)
Please take all new medication as prescribed - the cream  Please call in 1-2 weeks if not improving, for dermatology referral  Please continue all other medications as before, and refills have been done if requested.  Please have the pharmacy call with any other refills you may need.  Please keep your appointments with your specialists as you may have planned

## 2014-12-23 NOTE — Assessment & Plan Note (Signed)
C/w likely tinea skin infection of limited areas - for ketoconozole cr prn,  to f/u any worsening symptoms or concerns

## 2014-12-23 NOTE — Progress Notes (Signed)
   Subjective:    Patient ID: Andrea [name], female    DOB: 1950/05/19, 64 y.o.   MRN: [id number]  HPI  Here with acute onset 2 wks worsening light/white spots numerous to upper extremities more distal than proximal, bilat, nontender, nonraised, variable sized, no fever, trauma or topical treatments of any kind.  Mild itchy but no erythema or lesions to other such as torso, legs or head and neck. No prior hx of same. Past Medical History  Diagnosis Date  . COPD 03/23/2008  . Cough 11/05/2007  . HYPERTENSION 02/10/2007  . MUSCLE CRAMPS 08/09/2009  . OSTEOPOROSIS 02/10/2007  . PEPTIC ULCER DISEASE 03/23/2008  . PNEUMONIA, RIGHT UPPER LOBE 03/23/2008  . RESTLESS LEG SYNDROME 03/23/2008  . Unspecified disorder of thyroid 11/16/2007  . Allergic rhinitis, cause unspecified 08/07/2012  . Other and unspecified hyperlipidemia 08/07/2012   Past Surgical History  Procedure Laterality Date  . Breast biopsy negative  1972    reports that she has quit smoking. She does not have any smokeless tobacco history on file. She reports that she drinks alcohol. She reports that she does not use illicit drugs. family history includes Leukemia in her father. Allergies  Allergen Reactions  . Alendronate Sodium Nausea Only    GI upset  . Zyrtec [Cetirizine] Itching, Rash and Other (See Comments)    Tongue burning, thrush  . Sertraline Hcl Other (See Comments)    unknown   Current Outpatient Prescriptions on File Prior to Visit  Medication Sig Dispense Refill  . ALPRAZolam (XANAX) 0.25 MG tablet Take 1 tablet (0.25 mg total) by mouth at bedtime as needed for anxiety. 30 tablet 2  . aspirin EC 81 MG tablet Take 1 tablet (81 mg total) by mouth daily. 30 tablet 5  . atorvastatin (LIPITOR) 10 MG tablet Take 1 tablet (10 mg total) by mouth at bedtime. 90 tablet 3  . doxycycline (PERIOSTAT) 20 MG tablet Take 20 mg by mouth daily.     . Fluticasone-Salmeterol (ADVAIR) 250-50 MCG/DOSE AEPB Inhale 1 puff into the lungs  2 (two) times daily as needed.     . triamterene-hydrochlorothiazide (MAXZIDE-25) 37.5-25 MG per tablet Take 1 tablet by mouth daily. 90 tablet 3   No current facility-administered medications on file prior to visit.    Review of Systems All otherwise neg per pt     Objective:   Physical Exam BP 130/86 mmHg  Pulse 85  Temp(Src) 98.2 F (36.8 C) (Oral)  Ht  (1.651 m)  Wt 108 lb (48.988 kg)  BMI 17.97 kg/m2  SpO2 95% VS noted, not ill appearing Constitutional: Pt appears in no significant distress HENT: Head: NCAT.  Right Ear: External ear normal.  Left Ear: External ear normal.  Eyes: . Pupils are equal, round, and reactive to light. Conjunctivae and EOM are normal Neck: Normal range of motion. Neck supple.  Cardiovascular: Normal rate and regular rhythm.   Pulmonary/Chest: Effort normal and breath sounds without rales or wheezing.  Neurological: Pt is alert. Not confused , motor grossly intact Skin: Skin is warm, no LE edema with rash - light/white spots numerous to upper extremities more distal than proximal, bilat, nontender, nonraised, variable sized up to 5-6 mm Psychiatric: Pt behavior is normal. No agitation.     Assessment & Plan:

## 2015-01-18 ENCOUNTER — Ambulatory Visit (INDEPENDENT_AMBULATORY_CARE_PROVIDER_SITE_OTHER): Payer: BLUE CROSS/BLUE SHIELD | Admitting: Internal Medicine

## 2015-01-18 ENCOUNTER — Encounter: Payer: Self-pay | Admitting: Internal Medicine

## 2015-01-18 ENCOUNTER — Other Ambulatory Visit (INDEPENDENT_AMBULATORY_CARE_PROVIDER_SITE_OTHER): Payer: BLUE CROSS/BLUE SHIELD

## 2015-01-18 VITALS — BP 120/80 | HR 83 | Temp 97.8°F | Ht 65.0 in | Wt 107.0 lb

## 2015-01-18 DIAGNOSIS — Z0000 Encounter for general adult medical examination without abnormal findings: Secondary | ICD-10-CM

## 2015-01-18 DIAGNOSIS — M19041 Primary osteoarthritis, right hand: Secondary | ICD-10-CM

## 2015-01-18 DIAGNOSIS — I10 Essential (primary) hypertension: Secondary | ICD-10-CM

## 2015-01-18 DIAGNOSIS — M25562 Pain in left knee: Secondary | ICD-10-CM

## 2015-01-18 DIAGNOSIS — J309 Allergic rhinitis, unspecified: Secondary | ICD-10-CM

## 2015-01-18 DIAGNOSIS — M25561 Pain in right knee: Secondary | ICD-10-CM | POA: Insufficient documentation

## 2015-01-18 DIAGNOSIS — M19042 Primary osteoarthritis, left hand: Secondary | ICD-10-CM

## 2015-01-18 DIAGNOSIS — R21 Rash and other nonspecific skin eruption: Secondary | ICD-10-CM | POA: Diagnosis not present

## 2015-01-18 DIAGNOSIS — M19049 Primary osteoarthritis, unspecified hand: Secondary | ICD-10-CM | POA: Insufficient documentation

## 2015-01-18 LAB — URINALYSIS, ROUTINE W REFLEX MICROSCOPIC
Bilirubin Urine: NEGATIVE
Hgb urine dipstick: NEGATIVE
Ketones, ur: NEGATIVE
Nitrite: NEGATIVE
RBC / HPF: NONE SEEN (ref 0–?)
Specific Gravity, Urine: 1.01 (ref 1.000–1.030)
TOTAL PROTEIN, URINE-UPE24: NEGATIVE
URINE GLUCOSE: NEGATIVE
Urobilinogen, UA: 0.2 (ref 0.0–1.0)
pH: 7 (ref 5.0–8.0)

## 2015-01-18 LAB — LIPID PANEL
CHOL/HDL RATIO: 2
CHOLESTEROL: 176 mg/dL (ref 0–200)
HDL: 72.2 mg/dL (ref >39.00)
LDL Cholesterol: 66 mg/dL (ref 0–99)
NonHDL: 103.55
TRIGLYCERIDES: 190 mg/dL — AB (ref 0.0–149.0)
VLDL: 38 mg/dL (ref 0.0–40.0)

## 2015-01-18 LAB — CBC WITH DIFFERENTIAL/PLATELET
BASOS PCT: 1.1 % (ref 0.0–3.0)
Basophils Absolute: 0.1 10*3/uL (ref 0.0–0.1)
EOS ABS: 0.3 10*3/uL (ref 0.0–0.7)
Eosinophils Relative: 5.3 % — ABNORMAL HIGH (ref 0.0–5.0)
HCT: 41.8 % (ref 36.0–46.0)
Hemoglobin: 14.3 g/dL (ref 12.0–15.0)
Lymphocytes Relative: 22 % (ref 12.0–46.0)
Lymphs Abs: 1.2 10*3/uL (ref 0.7–4.0)
MCHC: 34.2 g/dL (ref 30.0–36.0)
MCV: 97.1 fl (ref 78.0–100.0)
Monocytes Absolute: 0.3 10*3/uL (ref 0.1–1.0)
Monocytes Relative: 6.3 % (ref 3.0–12.0)
NEUTROS ABS: 3.6 10*3/uL (ref 1.4–7.7)
Neutrophils Relative %: 65.3 % (ref 43.0–77.0)
PLATELETS: 326 10*3/uL (ref 150.0–400.0)
RBC: 4.3 Mil/uL (ref 3.87–5.11)
RDW: 13.1 % (ref 11.5–15.5)
WBC: 5.5 10*3/uL (ref 4.0–10.5)

## 2015-01-18 LAB — TSH: TSH: 1.55 u[IU]/mL (ref 0.35–4.50)

## 2015-01-18 LAB — BASIC METABOLIC PANEL
BUN: 30 mg/dL — ABNORMAL HIGH (ref 6–23)
CHLORIDE: 100 meq/L (ref 96–112)
CO2: 31 mEq/L (ref 19–32)
Calcium: 10.1 mg/dL (ref 8.4–10.5)
Creatinine, Ser: 1.15 mg/dL (ref 0.40–1.20)
GFR: 61.21 mL/min (ref >60.00)
Glucose, Bld: 75 mg/dL (ref 70–99)
POTASSIUM: 4 meq/L (ref 3.5–5.1)
Sodium: 138 mEq/L (ref 135–145)

## 2015-01-18 LAB — HEPATIC FUNCTION PANEL
ALT: 17 U/L (ref 0–35)
AST: 27 U/L (ref 0–37)
Albumin: 4.5 g/dL (ref 3.5–5.2)
Alkaline Phosphatase: 97 U/L (ref 39–117)
BILIRUBIN DIRECT: 0.2 mg/dL (ref 0.0–0.3)
BILIRUBIN TOTAL: 0.8 mg/dL (ref 0.2–1.2)
Total Protein: 8.5 g/dL — ABNORMAL HIGH (ref 6.0–8.3)

## 2015-01-18 MED ORDER — MONTELUKAST SODIUM 10 MG PO TABS: 10.0000 mg | ORAL_TABLET | Freq: Every day | ORAL | Status: DC

## 2015-01-18 MED ORDER — AZELASTINE HCL 0.1 % NA SOLN: 2.0000 | Freq: Two times a day (BID) | NASAL | Status: DC

## 2015-01-18 MED ORDER — DICLOFENAC SODIUM 1 % TD GEL: 4.0000 g | Freq: Four times a day (QID) | TRANSDERMAL | Status: DC | PRN

## 2015-01-18 NOTE — Assessment & Plan Note (Signed)
Also for singulair and astelin asd, pt declines allergy referral

## 2015-01-18 NOTE — Patient Instructions (Signed)
Please take all new medication as prescribed - the gel for pain in the hands and knees, as well as astelin and singulair for allergies  Please continue all other medications as before, including the cream for the arm rash  Please have the pharmacy call with any other refills you may need.  Please continue your efforts at being more active, low cholesterol diet, and weight control.  You are otherwise up to date with prevention measures today.  Please keep your appointments with your specialists as you may have planned  Please go to the LAB in the Basement (turn left off the elevator) for the tests to be done today  You will be contacted by phone if any changes need to be made immediately.  Otherwise, you will receive a letter about your results with an explanation, but please check with MyChart first.  Please remember to sign up for MyChart if you have not done so, as this will be important to you in the future with finding out test results, communicating by private email, and scheduling acute appointments online when needed.  Please return in 6 months, or sooner if needed

## 2015-01-18 NOTE — Progress Notes (Signed)
Pre visit review using our clinic review tool, if applicable. No additional management support is needed unless otherwise documented below in the visit note. 

## 2015-01-18 NOTE — Assessment & Plan Note (Signed)
Likely OA related pain, for volt gel prn,  to f/u any worsening symptoms or concerns

## 2015-01-18 NOTE — Assessment & Plan Note (Signed)
Likely arthritis - oa, for volt gel prn,  to f/u any worsening symptoms or concerns

## 2015-01-18 NOTE — Assessment & Plan Note (Signed)
stable overall by history and exam, recent data reviewed with pt, and pt to continue medical treatment as before,  to f/u any worsening symptoms or concerns BP Readings from Last 3 Encounters:  01/18/15 120/80  12/23/14 130/86  02/24/14 138/88

## 2015-01-18 NOTE — Progress Notes (Signed)
Subjective:    Patient ID: Andrea [name], female    DOB: [birth year], 64 y.o.   MRN: [id number]  HPI  Here for wellness and f/u;  Overall doing ok;  Pt denies Chest pain, worsening SOB, DOE, wheezing, orthopnea, PND, worsening LE edema, palpitations, dizziness or syncope.  Pt denies neurological change such as new headache, facial or extremity weakness.  Pt denies polydipsia, polyuria, or low sugar symptoms. Pt states overall good compliance with treatment and medications, good tolerability, and has been trying to follow appropriate diet.  Pt denies worsening depressive symptoms, suicidal ideation or panic. No fever, night sweats, wt loss, loss of appetite, or other constitutional symptoms.  Pt states good ability with ADL's, has low fall risk, home safety reviewed and adequate, no other significant changes in hearing or vision, and only occasionally active with exercise. No current compalint except Tinea rash improving, just not resolved completely yet. Does have several wks ongoing nasal allergy symptoms with clearish congestion, itch and sneezing, without fever, pain, ST, cough, swelling or wheezing., despite all otc med use  including zyrtec and nasacort.  Does not want allergy referral, has not tried singulair or astelin type meds..  Has ongoing bilat hand and knee pain, asks for tx.   Past Medical History  Diagnosis Date  . COPD 03/23/2008  . Cough 11/05/2007  . HYPERTENSION 02/10/2007  . MUSCLE CRAMPS 08/09/2009  . OSTEOPOROSIS 02/10/2007  . PEPTIC ULCER DISEASE 03/23/2008  . PNEUMONIA, RIGHT UPPER LOBE 03/23/2008  . RESTLESS LEG SYNDROME 03/23/2008  . Unspecified disorder of thyroid 11/16/2007  . Allergic rhinitis, cause unspecified 08/07/2012  . Other and unspecified hyperlipidemia 08/07/2012   Past Surgical History  Procedure Laterality Date  . Breast biopsy negative  1972    reports that she has quit smoking. She does not have any smokeless tobacco history on file. She reports that she  drinks alcohol. She reports that she does not use illicit drugs. family history includes Leukemia in her father. Allergies  Allergen Reactions  . Alendronate Sodium Nausea Only    GI upset  . Zyrtec [Cetirizine] Itching, Rash and Other (See Comments)    Tongue burning, thrush  . Sertraline Hcl Other (See Comments)    unknown   Current Outpatient Prescriptions on File Prior to Visit  Medication Sig Dispense Refill  . ALPRAZolam (XANAX) 0.25 MG tablet Take 1 tablet (0.25 mg total) by mouth at bedtime as needed for anxiety. 30 tablet 2  . aspirin EC 81 MG tablet Take 1 tablet (81 mg total) by mouth daily. 30 tablet 5  . atorvastatin (LIPITOR) 10 MG tablet Take 1 tablet (10 mg total) by mouth at bedtime. 90 tablet 3  . doxycycline (PERIOSTAT) 20 MG tablet Take 20 mg by mouth daily.     . Fluticasone-Salmeterol (ADVAIR) 250-50 MCG/DOSE AEPB Inhale 1 puff into the lungs 2 (two) times daily as needed.     Marland Kitchen ketoconazole (NIZORAL) 2 % cream Apply 1 application topically daily. 60 g 0  . triamterene-hydrochlorothiazide (MAXZIDE-25) 37.5-25 MG per tablet Take 1 tablet by mouth daily. 90 tablet 3   No current facility-administered medications on file prior to visit.    Review of Systems Constitutional: Negative for increased diaphoresis, other activity, appetite or siginficant weight change other than noted HENT: Negative for worsening hearing loss, ear pain, facial swelling, mouth sores and neck stiffness.   Eyes: Negative for other worsening pain, redness or visual disturbance.  Respiratory: Negative for shortness of breath and wheezing  Cardiovascular: Negative for chest pain and palpitations.  Gastrointestinal: Negative for diarrhea, blood in stool, abdominal distention or other pain Genitourinary: Negative for hematuria, flank pain or change in urine volume.  Musculoskeletal: Negative for myalgias or other joint complaints.  Skin: Negative for color change and wound or drainage.    Neurological: Negative for syncope and numbness. other than noted Hematological: Negative for adenopathy. or other swelling Psychiatric/Behavioral: Negative for hallucinations, SI, self-injury, decreased concentration or other worsening agitation.      Objective:   Physical Exam BP 120/80 mmHg  Pulse 83  Temp(Src) 97.8 F (36.6 C) (Oral)  Ht  (1.651 m)  Wt 107 lb (48.535 kg)  BMI 17.81 kg/m2  SpO2 98% VS noted,  Constitutional: Pt is oriented to person, place, and time. Appears well-developed and well-nourished, in no significant distress Head: Normocephalic and atraumatic.  Right Ear: External ear normal.  Left Ear: External ear normal.  Nose: Nose normal.  Mouth/Throat: Oropharynx is clear and moist.  Eyes: Conjunctivae and EOM are normal. Pupils are equal, round, and reactive to light.  Bilat tm's with mild erythema.  Max sinus areas non tender.  Pharynx with mild erythema, no exudate Neck: Normal range of motion. Neck supple. No JVD present. No tracheal deviation present or significant neck LA or mass Cardiovascular: Normal rate, regular rhythm, normal heart sounds and intact distal pulses.   Pulmonary/Chest: Effort normal and breath sounds without rales or wheezing  Abdominal: Soft. Bowel sounds are normal. NT. No HSM  Musculoskeletal: Normal range of motion. Exhibits no edema. bilat knees with slight crepitus but without significant deg bony changes or effusion, has FROM Bilat hands with all DIP and PIP's with some degree of OA changes, also left thumb signficaint CMP deg change Lymphadenopathy:  Has no cervical adenopathy.  Neurological: Pt is alert and oriented to person, place, and time. Pt has normal reflexes. No cranial nerve deficit. Motor grossly intact Skin: Skin is warm and dry. Light areas of skin c/w tinea rash improved.  Psychiatric:  Has normal mood and affect. Behavior is normal.     Assessment & Plan:

## 2015-01-18 NOTE — Assessment & Plan Note (Signed)
Improved, likely tinea, cont ketocon cream asd

## 2015-01-19 LAB — HEPATITIS C ANTIBODY: HCV Ab: NEGATIVE

## 2015-03-25 ENCOUNTER — Other Ambulatory Visit: Payer: Self-pay | Admitting: Internal Medicine

## 2015-05-25 ENCOUNTER — Other Ambulatory Visit: Payer: Self-pay

## 2015-05-25 DIAGNOSIS — Z1231 Encounter for screening mammogram for malignant neoplasm of breast: Secondary | ICD-10-CM

## 2015-06-29 ENCOUNTER — Ambulatory Visit
Admission: RE | Admit: 2015-06-29 | Discharge: 2015-06-29 | Disposition: A | Discharge status: Home / Self Care | Payer: BLUE CROSS/BLUE SHIELD | Source: Ambulatory Visit

## 2015-06-29 DIAGNOSIS — Z1231 Encounter for screening mammogram for malignant neoplasm of breast: Secondary | ICD-10-CM

## 2015-07-20 ENCOUNTER — Telehealth: Payer: Self-pay | Admitting: *Deleted

## 2015-07-20 NOTE — Telephone Encounter (Signed)
I called pt to see if she has received the 2016-17 Flu vaccine. No answer. Left mess for patient to call back if she would like to receive the vaccine today or tomorrow 07/21/15.

## 2015-12-22 ENCOUNTER — Encounter (HOSPITAL_COMMUNITY): Payer: Self-pay

## 2015-12-22 ENCOUNTER — Emergency Department (HOSPITAL_COMMUNITY)
Admission: EM | Admit: 2015-12-22 | Discharge: 2015-12-22 | Disposition: A | Discharge status: Home / Self Care | Payer: BLUE CROSS/BLUE SHIELD | Attending: Emergency Medicine | Admitting: Emergency Medicine

## 2015-12-22 ENCOUNTER — Emergency Department (HOSPITAL_COMMUNITY): Payer: BLUE CROSS/BLUE SHIELD

## 2015-12-22 DIAGNOSIS — I10 Essential (primary) hypertension: Secondary | ICD-10-CM | POA: Insufficient documentation

## 2015-12-22 DIAGNOSIS — J301 Allergic rhinitis due to pollen: Secondary | ICD-10-CM

## 2015-12-22 DIAGNOSIS — Z7982 Long term (current) use of aspirin: Secondary | ICD-10-CM | POA: Insufficient documentation

## 2015-12-22 DIAGNOSIS — Z79899 Other long term (current) drug therapy: Secondary | ICD-10-CM | POA: Diagnosis not present

## 2015-12-22 DIAGNOSIS — J449 Chronic obstructive pulmonary disease, unspecified: Secondary | ICD-10-CM | POA: Insufficient documentation

## 2015-12-22 DIAGNOSIS — R918 Other nonspecific abnormal finding of lung field: Secondary | ICD-10-CM | POA: Diagnosis not present

## 2015-12-22 DIAGNOSIS — Z87891 Personal history of nicotine dependence: Secondary | ICD-10-CM | POA: Insufficient documentation

## 2015-12-22 DIAGNOSIS — R0602 Shortness of breath: Secondary | ICD-10-CM | POA: Diagnosis present

## 2015-12-22 LAB — CBC WITH DIFFERENTIAL/PLATELET
BASOS PCT: 0 %
Basophils Absolute: 0 10*3/uL (ref 0.0–0.1)
EOS ABS: 0.4 10*3/uL (ref 0.0–0.7)
EOS PCT: 9 %
HCT: 38.9 % (ref 36.0–46.0)
HEMOGLOBIN: 13.3 g/dL (ref 12.0–15.0)
LYMPHS ABS: 0.8 10*3/uL (ref 0.7–4.0)
Lymphocytes Relative: 15 %
MCH: 32.6 pg (ref 26.0–34.0)
MCHC: 34.2 g/dL (ref 30.0–36.0)
MCV: 95.3 fL (ref 78.0–100.0)
MONOS PCT: 5 %
Monocytes Absolute: 0.3 10*3/uL (ref 0.1–1.0)
Neutro Abs: 3.7 10*3/uL (ref 1.7–7.7)
Neutrophils Relative %: 71 %
PLATELETS: 261 10*3/uL (ref 150–400)
RBC: 4.08 MIL/uL (ref 3.87–5.11)
RDW: 12.7 % (ref 11.5–15.5)
WBC: 5.2 10*3/uL (ref 4.0–10.5)

## 2015-12-22 LAB — I-STAT TROPONIN, ED: Troponin i, poc: 0 ng/mL (ref 0.00–0.08)

## 2015-12-22 LAB — I-STAT CHEM 8, ED
BUN: 40 mg/dL — ABNORMAL HIGH (ref 6–20)
Calcium, Ion: 1.16 mmol/L (ref 1.15–1.40)
Chloride: 103 mmol/L (ref 101–111)
Creatinine, Ser: 1.6 mg/dL — ABNORMAL HIGH (ref 0.44–1.00)
Glucose, Bld: 89 mg/dL (ref 65–99)
HEMATOCRIT: 41 % (ref 36.0–46.0)
Hemoglobin: 13.9 g/dL (ref 12.0–15.0)
Potassium: 3 mmol/L — ABNORMAL LOW (ref 3.5–5.1)
SODIUM: 143 mmol/L (ref 135–145)
TCO2: 24 mmol/L (ref 0–100)

## 2015-12-22 MED ORDER — PREDNISONE 20 MG PO TABS
60.0000 mg | ORAL_TABLET | Freq: Once | ORAL | Status: AC
  Administered 2015-12-22: 60 mg via ORAL
  Filled 2015-12-22: qty 3

## 2015-12-22 MED ORDER — MONTELUKAST SODIUM 10 MG PO TABS: 10.0000 mg | ORAL_TABLET | Freq: Every day | ORAL | 0 refills | Status: DC

## 2015-12-22 MED ORDER — IPRATROPIUM BROMIDE 0.03 % NA SOLN: 2.0000 | Freq: Two times a day (BID) | NASAL | 12 refills | Status: DC

## 2015-12-22 MED ORDER — OXYMETAZOLINE HCL 0.05 % NA SOLN
1.0000 | Freq: Once | NASAL | Status: AC
  Administered 2015-12-22: 1 via NASAL
  Filled 2015-12-22: qty 15

## 2015-12-22 MED ORDER — ALBUTEROL SULFATE (2.5 MG/3ML) 0.083% IN NEBU
2.5000 mg | INHALATION_SOLUTION | Freq: Once | RESPIRATORY_TRACT | Status: AC
  Administered 2015-12-22: 2.5 mg via RESPIRATORY_TRACT
  Filled 2015-12-22: qty 3

## 2015-12-22 MED ORDER — POTASSIUM CHLORIDE CRYS ER 20 MEQ PO TBCR
40.0000 meq | EXTENDED_RELEASE_TABLET | Freq: Once | ORAL | Status: AC
  Administered 2015-12-22: 40 meq via ORAL
  Filled 2015-12-22: qty 2

## 2015-12-22 NOTE — ED Triage Notes (Signed)
Pt states that she started feeling SOB today, along with coughing, sneezing, and stuffiness, took some Claritin without relief. Pt has a hx of COPD states she had episode tonight when she could not breath.

## 2015-12-22 NOTE — ED Provider Notes (Signed)
[facility] Provider Note   CSN: [id number] Arrival date & time: 12/22/15  4098  By signing my name below, I, Soijett Blue, attest that this documentation has been prepared under the direction and in the presence of Earline Stiner, MD. Electronically Signed: Soijett Blue, ED Scribe. 12/22/15. 3:43 AM.    History   Chief Complaint Chief Complaint  Patient presents with  . Shortness of Breath    HPI Andrea [name] is a 65 y.o. female with a medical hx of COPD, HTN, who presents to the Emergency Department complaining URI onset 2 days ago. Pt notes that her symptoms began with nasal congestion and cough. Pt reports that she woke up last night with a feeling of being unable to breathe. Pt states that she went to the pharmacy and was informed to take claritin for her symptoms.. Pt is having associated symptoms of nasal congestion and cough. She notes that she has tried claritin with no relief of her symptoms. She denies fever, chills, and any other symptoms.  She feels like she cannot get a deep breath in because her nose is stopped up.  Is not taking most of her COPD and allergy medications   The history is provided by the patient. No language interpreter was used.  URI   This is a recurrent problem. The current episode started 2 days ago. The problem has not changed since onset.There has been no fever. Associated symptoms include congestion, sneezing and cough. Pertinent negatives include no chest pain, no vomiting and no wheezing. The treatment provided no relief.    Past Medical History:  Diagnosis Date  . Allergic rhinitis, cause unspecified 08/07/2012  . COPD 03/23/2008  . Cough 11/05/2007  . HYPERTENSION 02/10/2007  . MUSCLE CRAMPS 08/09/2009  . OSTEOPOROSIS 02/10/2007  . Other and unspecified hyperlipidemia 08/07/2012  . PEPTIC ULCER DISEASE 03/23/2008  . PNEUMONIA, RIGHT UPPER LOBE 03/23/2008  . RESTLESS LEG SYNDROME 03/23/2008  . Unspecified disorder of thyroid 11/16/2007     Patient Active Problem List   Diagnosis Date Noted  . Osteoarthritis, hand 01/18/2015  . Bilateral knee pain 01/18/2015  . Rash and nonspecific skin eruption 12/23/2014  . TIA (transient ischemic attack) 02/11/2014  . Allergic rhinitis 08/07/2012  . Thrush 08/07/2012  . Hyperlipidemia 08/07/2012  . Preventative health care 12/09/2010  . MUSCLE CRAMPS 08/09/2009  . RESTLESS LEG SYNDROME 03/23/2008  . PNEUMONIA, RIGHT UPPER LOBE 03/23/2008  . COPD (chronic obstructive pulmonary disease) (HCC) 03/23/2008  . Peptic ulcer 03/23/2008  . Unspecified disorder of thyroid 11/16/2007  . Essential hypertension 02/10/2007  . OSTEOPOROSIS 02/10/2007    Past Surgical History:  Procedure Laterality Date  . Breast Biopsy negative  1972    OB History    No data available       Home Medications    Prior to Admission medications   Medication Sig Start Date End Date Taking? Authorizing Provider  ALPRAZolam (XANAX) 0.25 MG tablet Take 1 tablet (0.25 mg total) by mouth at bedtime as needed for anxiety. 02/24/14   Corwin Levins, MD  aspirin EC 81 MG tablet Take 1 tablet (81 mg total) by mouth daily. 02/12/14   Ripudeep Jenna Luo, MD  atorvastatin (LIPITOR) 10 MG tablet TAKE 1 TABLET AT BEDTIME 03/27/15   Corwin Levins, MD  azelastine (ASTELIN) 0.1 % nasal spray Place 2 sprays into both nostrils 2 (two) times daily. Use in each nostril as directed 01/18/15   Corwin Levins, MD  diclofenac sodium (VOLTAREN) 1 %  GEL Apply 4 g topically 4 (four) times daily as needed. 01/18/15   Corwin Levins, MD  doxycycline (PERIOSTAT) 20 MG tablet Take 20 mg by mouth daily.  05/18/12   Historical Provider, MD  Fluticasone-Salmeterol (ADVAIR) 250-50 MCG/DOSE AEPB Inhale 1 puff into the lungs 2 (two) times daily as needed.     Historical Provider, MD  ketoconazole (NIZORAL) 2 % cream Apply 1 application topically daily. 12/23/14   Corwin Levins, MD  montelukast (SINGULAIR) 10 MG tablet Take 1 tablet (10 mg total) by mouth  daily. 01/18/15   Corwin Levins, MD  triamterene-hydrochlorothiazide [facility]) 37.5-25 MG tablet TAKE 1 TABLET DAILY 03/27/15   Corwin Levins, MD    Family History Family History  Problem Relation Age of Onset  . Leukemia Father     Social History Social History  Substance Use Topics  . Smoking status: Former Games developer  . Smokeless tobacco: Not on file     Comment: since 2009  . Alcohol use Yes     Allergies   Alendronate sodium; Zyrtec [cetirizine]; and Sertraline hcl   Review of Systems Review of Systems  Constitutional: Negative for chills, diaphoresis and fever.  HENT: Positive for congestion and sneezing.   Respiratory: Positive for cough. Negative for chest tightness and wheezing.   Cardiovascular: Negative for chest pain.  Gastrointestinal: Negative for vomiting.  All other systems reviewed and are negative.    Physical Exam Updated Vital Signs BP 151/89   Pulse 97   Temp 97.9 F (36.6 C)   Resp 26   Ht 5\' 4"  (1.626 m)   Wt 102 lb (46.3 kg)   SpO2 100%   BMI 17.51 kg/m   Physical Exam  Constitutional: She is oriented to person, place, and time. She appears well-developed and well-nourished. No distress.  HENT:  Head: Normocephalic and atraumatic.  Mouth/Throat: Oropharynx is clear and moist. No oropharyngeal exudate.  Clear colorless postnasal drip  Eyes: Conjunctivae and EOM are normal. Pupils are equal, round, and reactive to light.  Neck: Trachea normal and normal range of motion. Neck supple. No JVD present. Carotid bruit is not present. No tracheal deviation present.  Trachea midline. No stridor. No bruits.   Cardiovascular: Normal rate, regular rhythm and normal heart sounds.  Exam reveals no gallop and no friction rub.   No murmur heard. Pulmonary/Chest: Effort normal and breath sounds normal. No stridor. No respiratory distress. She has no wheezes. She has no rales.  Abdominal: Soft. Bowel sounds are normal. She exhibits no distension and no  mass. There is no tenderness. There is no rebound and no guarding.  Musculoskeletal: Normal range of motion. She exhibits no edema.  Soft compartments. No palpable cords. No BLE edema  Neurological: She is alert and oriented to person, place, and time. She has normal reflexes.  DTR's intact.   Skin: Skin is warm and dry. Capillary refill takes less than 2 seconds. She is not diaphoretic.  Psychiatric: She has a normal mood and affect. Her behavior is normal.  Nursing note and vitals reviewed.    ED Treatments / Results  DIAGNOSTIC STUDIES: Oxygen Saturation is 100% on RA, nl by my interpretation.    COORDINATION OF CARE: 3:38 AM Discussed treatment plan with pt at bedside which includes breathing treatment and labs and pt agreed to plan.  EKG Interpretation  Date/Time:  Friday December 22 2015 04:31:57 EDT Ventricular Rate:  87 PR Interval:    QRS Duration: 92 QT Interval:  394  QTC Calculation: 474 R Axis:   100 Text Interpretation:  Sinus rhythm Confirmed by [facility]  MD, Anmol Fleck ([id number]) on 12/22/2015 4:51:06 AM      Results for orders placed or performed during the hospital encounter of 12/22/15  CBC with Differential/Platelet  Result Value Ref Range   WBC 5.2 4.0 - 10.5 K/uL   RBC 4.08 3.87 - 5.11 MIL/uL   Hemoglobin 13.3 12.0 - 15.0 g/dL   HCT 91.438.9 78.236.0 - 95.646.0 %   MCV 95.3 78.0 - 100.0 fL   MCH 32.6 26.0 - 34.0 pg   MCHC 34.2 30.0 - 36.0 g/dL   RDW 21.312.7 08.611.5 - 57.815.5 %   Platelets 261 150 - 400 K/uL   Neutrophils Relative % 71 %   Neutro Abs 3.7 1.7 - 7.7 K/uL   Lymphocytes Relative 15 %   Lymphs Abs 0.8 0.7 - 4.0 K/uL   Monocytes Relative 5 %   Monocytes Absolute 0.3 0.1 - 1.0 K/uL   Eosinophils Relative 9 %   Eosinophils Absolute 0.4 0.0 - 0.7 K/uL   Basophils Relative 0 %   Basophils Absolute 0.0 0.0 - 0.1 K/uL  I-stat chem 8, ed  Result Value Ref Range   Sodium 143 135 - 145 mmol/L   Potassium 3.0 (L) 3.5 - 5.1 mmol/L   Chloride 103 101 - 111 mmol/L    BUN 40 (H) 6 - 20 mg/dL   Creatinine, Ser 4.691.60 (H) 0.44 - 1.00 mg/dL   Glucose, Bld 89 65 - 99 mg/dL   Calcium, Ion 6.291.16 5.281.15 - 1.40 mmol/L   TCO2 24 0 - 100 mmol/L   Hemoglobin 13.9 12.0 - 15.0 g/dL   HCT 41.341.0 24.436.0 - 01.046.0 %  I-stat troponin, ED  Result Value Ref Range   Troponin i, poc 0.00 0.00 - 0.08 ng/mL   Comment 3           Dg Chest 2 View  Result Date: 12/22/2015 CLINICAL DATA:  Nasal congestion.  COPD. EXAM: CHEST  2 VIEW COMPARISON:  11/02/2008 FINDINGS: There is moderate hyperinflation. The lungs are clear. The pulmonary vasculature is normal. There is no pleural effusion. Hilar, mediastinal and cardiac contours are unremarkable and unchanged. IMPRESSION: Hyperinflation. Electronically Signed   By: Ellery Plunkaniel R Mitchell M.D.   On: 12/22/2015 04:09   Medications  albuterol (PROVENTIL) (2.5 MG/3ML) 0.083% nebulizer solution 2.5 mg (2.5 mg Nebulization Given 12/22/15 0419)  predniSONE (DELTASONE) tablet 60 mg (60 mg Oral Given 12/22/15 0432)  potassium chloride SA (K-DUR,KLOR-CON) CR tablet 40 mEq (40 mEq Oral Given 12/22/15 0432)     Labs (all labs ordered are listed, but only abnormal results are displayed) Labs Reviewed - No data to display   Radiology No results found.  Procedures Procedures (including critical care time)  Medications Ordered in ED Medications - No data to display   Initial Impression / Assessment and Plan / ED Course  I have reviewed the triage vital signs and the nursing notes.  Pertinent labs & imaging results that were available during my care of the patient were reviewed by me and considered in my medical decision making (see chart for details).  Clinical Course      Final Clinical Impressions(s) / ED Diagnoses   Final diagnoses:  None    New Prescriptions New Prescriptions   No medications on file   Restart you singulair, advair, and nasal inhaler.  Will start steroids and tessalon.  Follow up with your PMD for recheck this week.  All  questions answered to patient's satisfaction. Based on history and exam patient has been appropriately medically screened and emergency conditions excluded. Patient is stable for discharge at this time. Follow up with your PMD for recheck in 2 days and strict return precautions given.  I personally performed the services described in this documentation, which was scribed in my presence. The recorded information has been reviewed and is accurate.       Cy Blamer, MD 12/22/15 [phone]

## 2016-01-22 ENCOUNTER — Encounter: Payer: BLUE CROSS/BLUE SHIELD | Admitting: Internal Medicine

## 2016-02-23 ENCOUNTER — Encounter: Payer: Self-pay | Admitting: Internal Medicine

## 2016-02-23 ENCOUNTER — Other Ambulatory Visit (INDEPENDENT_AMBULATORY_CARE_PROVIDER_SITE_OTHER): Payer: BLUE CROSS/BLUE SHIELD

## 2016-02-23 ENCOUNTER — Other Ambulatory Visit: Payer: Self-pay | Admitting: Internal Medicine

## 2016-02-23 ENCOUNTER — Ambulatory Visit (INDEPENDENT_AMBULATORY_CARE_PROVIDER_SITE_OTHER): Payer: BLUE CROSS/BLUE SHIELD | Admitting: Internal Medicine

## 2016-02-23 VITALS — BP 118/70 | HR 76 | Temp 98.2°F | Resp 20 | Wt 109.0 lb

## 2016-02-23 DIAGNOSIS — I10 Essential (primary) hypertension: Secondary | ICD-10-CM | POA: Diagnosis not present

## 2016-02-23 DIAGNOSIS — Z0000 Encounter for general adult medical examination without abnormal findings: Secondary | ICD-10-CM

## 2016-02-23 DIAGNOSIS — N179 Acute kidney failure, unspecified: Secondary | ICD-10-CM | POA: Diagnosis not present

## 2016-02-23 DIAGNOSIS — J449 Chronic obstructive pulmonary disease, unspecified: Secondary | ICD-10-CM

## 2016-02-23 LAB — CBC WITH DIFFERENTIAL/PLATELET
BASOS ABS: 0 10*3/uL (ref 0.0–0.1)
Basophils Relative: 0.5 % (ref 0.0–3.0)
EOS ABS: 0.6 10*3/uL (ref 0.0–0.7)
Eosinophils Relative: 12.9 % — ABNORMAL HIGH (ref 0.0–5.0)
HEMATOCRIT: 41 % (ref 36.0–46.0)
HEMOGLOBIN: 14.1 g/dL (ref 12.0–15.0)
LYMPHS PCT: 22.1 % (ref 12.0–46.0)
Lymphs Abs: 1.1 10*3/uL (ref 0.7–4.0)
MCHC: 34.3 g/dL (ref 30.0–36.0)
MCV: 94.7 fl (ref 78.0–100.0)
MONOS PCT: 9.6 % (ref 3.0–12.0)
Monocytes Absolute: 0.5 10*3/uL (ref 0.1–1.0)
Neutro Abs: 2.7 10*3/uL (ref 1.4–7.7)
Neutrophils Relative %: 54.9 % (ref 43.0–77.0)
PLATELETS: 288 10*3/uL (ref 150.0–400.0)
RBC: 4.33 Mil/uL (ref 3.87–5.11)
RDW: 13.8 % (ref 11.5–15.5)
WBC: 4.9 10*3/uL (ref 4.0–10.5)

## 2016-02-23 LAB — BASIC METABOLIC PANEL
BUN: 33 mg/dL — AB (ref 6–23)
CALCIUM: 10.2 mg/dL (ref 8.4–10.5)
CHLORIDE: 103 meq/L (ref 96–112)
CO2: 28 meq/L (ref 19–32)
CREATININE: 1.17 mg/dL (ref 0.40–1.20)
GFR: 59.79 mL/min — ABNORMAL LOW (ref >60.00)
Glucose, Bld: 69 mg/dL — ABNORMAL LOW (ref 70–99)
Potassium: 3.2 mEq/L — ABNORMAL LOW (ref 3.5–5.1)
Sodium: 140 mEq/L (ref 135–145)

## 2016-02-23 LAB — HEPATIC FUNCTION PANEL
ALBUMIN: 4.3 g/dL (ref 3.5–5.2)
ALT: 13 U/L (ref 0–35)
AST: 22 U/L (ref 0–37)
Alkaline Phosphatase: 109 U/L (ref 39–117)
Bilirubin, Direct: 0.2 mg/dL (ref 0.0–0.3)
TOTAL PROTEIN: 8.1 g/dL (ref 6.0–8.3)
Total Bilirubin: 0.7 mg/dL (ref 0.2–1.2)

## 2016-02-23 LAB — LIPID PANEL
Cholesterol: 168 mg/dL (ref 0–200)
HDL: 69.2 mg/dL (ref >39.00)
LDL Cholesterol: 60 mg/dL (ref 0–99)
NonHDL: 99.26
TRIGLYCERIDES: 194 mg/dL — AB (ref 0.0–149.0)
Total CHOL/HDL Ratio: 2
VLDL: 38.8 mg/dL (ref 0.0–40.0)

## 2016-02-23 LAB — URINALYSIS, ROUTINE W REFLEX MICROSCOPIC
BILIRUBIN URINE: NEGATIVE
KETONES UR: NEGATIVE
Nitrite: NEGATIVE
SPECIFIC GRAVITY, URINE: 1.015 (ref 1.000–1.030)
Total Protein, Urine: NEGATIVE
URINE GLUCOSE: NEGATIVE
UROBILINOGEN UA: 0.2 (ref 0.0–1.0)
pH: 5.5 (ref 5.0–8.0)

## 2016-02-23 LAB — TSH: TSH: 1.24 u[IU]/mL (ref 0.35–4.50)

## 2016-02-23 MED ORDER — ATORVASTATIN CALCIUM 10 MG PO TABS
10.0000 mg | ORAL_TABLET | Freq: Every day | ORAL | 3 refills | Status: DC
Start: 2016-02-23 — End: 2017-02-26

## 2016-02-23 MED ORDER — POTASSIUM CHLORIDE ER 10 MEQ PO TBCR: 10.0000 meq | EXTENDED_RELEASE_TABLET | Freq: Every day | ORAL | 3 refills | Status: DC

## 2016-02-23 MED ORDER — FLUTICASONE-SALMETEROL 250-50 MCG/DOSE IN AEPB: 1.0000 | INHALATION_SPRAY | Freq: Two times a day (BID) | RESPIRATORY_TRACT | 3 refills | Status: DC | PRN

## 2016-02-23 MED ORDER — TRIAMTERENE-HCTZ 37.5-25 MG PO TABS: 1.0000 | ORAL_TABLET | Freq: Every day | ORAL | 3 refills | Status: DC

## 2016-02-23 NOTE — Assessment & Plan Note (Signed)
Noted on sept 2017 labs at ED eval, for f/u lab todya

## 2016-02-23 NOTE — Patient Instructions (Signed)
Please continue all other medications as before, and refills have been done if requested.  Please have the pharmacy call with any other refills you may need.  Please continue your efforts at being more active, low cholesterol diet, and weight control.  You are otherwise up to date with prevention measures today.  Please keep your appointments with your specialists as you may have planned  You are given the handicap parking application form today  Please go to the LAB in the Basement (turn left off the elevator) for the tests to be done today  You will be contacted by phone if any changes need to be made immediately.  Otherwise, you will receive a letter about your results with an explanation, but please check with MyChart first.  Please remember to sign up for MyChart if you have not done so, as this will be important to you in the future with finding out test results, communicating by private email, and scheduling acute appointments online when needed.  Please return in 1 year for your yearly visit, or sooner if needed, with Lab testing done 3-5 days before

## 2016-02-23 NOTE — Assessment & Plan Note (Signed)
stable overall by history and exam, recent data reviewed with pt, and pt to continue medical treatment as before,  to f/u any worsening symptoms or concerns BP Readings from Last 3 Encounters:  02/23/16 118/70  12/22/15 139/99  01/18/15 120/80

## 2016-02-23 NOTE — Progress Notes (Signed)
Subjective:    Patient ID: Andrea McalpineRhonda J [name], female    DOB: Jul 09, 1950, 65 y.o.   MRN: [id number]  HPI  Here for wellness and f/u;  Overall doing ok;  Pt denies Chest pain, worsening SOB, DOE, wheezing, orthopnea, PND, worsening LE edema, palpitations, dizziness or syncope.  Pt denies neurological change such as new headache, facial or extremity weakness.  Pt denies polydipsia, polyuria, or low sugar symptoms. Pt states overall good compliance with treatment and medications, good tolerability, and has been trying to follow appropriate diet.  Pt denies worsening depressive symptoms, suicidal ideation or panic. No fever, night sweats, wt loss, loss of appetite, or other constitutional symptoms.  Pt states good ability with ADL's, has low fall risk, home safety reviewed and adequate, no other significant changes in hearing or vision, and only occasionally active with exercise. Feels overall tired, wants to retire next yr, husband already retired x 3 yrs.  Already had flu shot at work 2 wks ago, declines GYN referral for now, does get mammogram yearly. No other changes  To basic hx Past Medical History:  Diagnosis Date  . Allergic rhinitis, cause unspecified 08/07/2012  . COPD 03/23/2008  . Cough 11/05/2007  . HYPERTENSION 02/10/2007  . MUSCLE CRAMPS 08/09/2009  . OSTEOPOROSIS 02/10/2007  . Other and unspecified hyperlipidemia 08/07/2012  . PEPTIC ULCER DISEASE 03/23/2008  . PNEUMONIA, RIGHT UPPER LOBE 03/23/2008  . RESTLESS LEG SYNDROME 03/23/2008  . Unspecified disorder of thyroid 11/16/2007   Past Surgical History:  Procedure Laterality Date  . Breast Biopsy negative  1972    reports that she has quit smoking. She has never used smokeless tobacco. She reports that she drinks alcohol. She reports that she does not use drugs. family history includes Leukemia in her father. Allergies  Allergen Reactions  . Alendronate Sodium Nausea Only    GI upset  . Zyrtec [Cetirizine] Itching, Rash and Other  (See Comments)    Tongue burning, thrush  . Sertraline Hcl Other (See Comments)    unknown   Current Outpatient Prescriptions on File Prior to Visit  Medication Sig Dispense Refill  . ALPRAZolam (XANAX) 0.25 MG tablet Take 1 tablet (0.25 mg total) by mouth at bedtime as needed for anxiety. 30 tablet 2  . aspirin EC 81 MG tablet Take 1 tablet (81 mg total) by mouth daily. 30 tablet 5   No current facility-administered medications on file prior to visit.    Review of Systems Constitutional: Negative for increased diaphoresis, or other activity, appetite or siginficant weight change other than noted HENT: Negative for worsening hearing loss, ear pain, facial swelling, mouth sores and neck stiffness.   Eyes: Negative for other worsening pain, redness or visual disturbance.  Respiratory: Negative for choking or stridor Cardiovascular: Negative for other chest pain and palpitations.  Gastrointestinal: Negative for worsening diarrhea, blood in stool, or abdominal distention Genitourinary: Negative for hematuria, flank pain or change in urine volume.  Musculoskeletal: Negative for myalgias or other joint complaints.  Skin: Negative for other color change and wound or drainage.  Neurological: Negative for syncope and numbness. other than noted Hematological: Negative for adenopathy. or other swelling Psychiatric/Behavioral: Negative for hallucinations, SI, self-injury, decreased concentration or other worsening agitation.  All other system neg per pt    Objective:   Physical Exam BP 118/70   Pulse 76   Temp 98.2 F (36.8 C) (Oral)   Resp 20   Wt 109 lb (49.4 kg)   SpO2 97%   BMI  18.71 kg/m  VS noted, thin, mild fatigued Constitutional: Pt is oriented to person, place, and time. Appears well-developed and well-nourished, in no significant distress Head: Normocephalic and atraumatic  Eyes: Conjunctivae and EOM are normal. Pupils are equal, round, and reactive to light Right Ear:  External ear normal.  Left Ear: External ear normal Nose: Nose normal.  Mouth/Throat: Oropharynx is clear and moist  Neck: Normal range of motion. Neck supple. No JVD present. No tracheal deviation present or significant neck LA or mass Cardiovascular: Normal rate, regular rhythm, normal heart sounds and intact distal pulses.   Pulmonary/Chest: Effort normal and breath sounds without rales or wheezing  Abdominal: Soft. Bowel sounds are normal. NT. No HSM  Musculoskeletal: Normal range of motion. Exhibits no edema Lymphadenopathy: Has no cervical adenopathy.  Neurological: Pt is alert and oriented to person, place, and time. Pt has normal reflexes. No cranial nerve deficit. Motor grossly intact Skin: Skin is warm and dry. No rash noted or new ulcers Psychiatric:  Has normal mood and affect. Behavior is normal.  No other significant exam findings    Assessment & Plan:

## 2016-02-23 NOTE — Progress Notes (Signed)
Pre visit review using our clinic review tool, if applicable. No additional management support is needed unless otherwise documented below in the visit note. 

## 2016-02-23 NOTE — Assessment & Plan Note (Signed)

## 2016-02-23 NOTE — Assessment & Plan Note (Signed)
Stable, for med refill 

## 2016-02-29 ENCOUNTER — Telehealth: Payer: Self-pay | Admitting: Internal Medicine

## 2016-02-29 NOTE — Telephone Encounter (Signed)
Pt called in said that she rec Potassium from her mail order.  She is not sure why this was ordered.  She has never taken this before.  She wants a nurse to give her a call about this med as soon as possible

## 2016-02-29 NOTE — Telephone Encounter (Signed)
An explanation was done previously by Mychart or letter I believe.  We had to add the K due to low K secondary to her fluid pill. Thanks

## 2016-02-29 NOTE — Telephone Encounter (Signed)
Pt request to speak to the assistant concern about this. She said Dr. Jonny RuizJohn did not discuss this with her during the ov.

## 2016-03-01 NOTE — Telephone Encounter (Signed)
Left message on all numbers provided to give us a call back.

## 2016-03-01 NOTE — Telephone Encounter (Signed)
Pt called back, can you please call her back asap.  She is upset and wants to speak with someone

## 2016-03-01 NOTE — Telephone Encounter (Signed)
Called and spoke to patient.

## 2016-05-24 ENCOUNTER — Other Ambulatory Visit: Payer: Self-pay | Admitting: Internal Medicine

## 2016-05-24 DIAGNOSIS — Z1231 Encounter for screening mammogram for malignant neoplasm of breast: Secondary | ICD-10-CM

## 2016-07-01 ENCOUNTER — Ambulatory Visit
Admission: RE | Admit: 2016-07-01 | Discharge: 2016-07-01 | Disposition: A | Discharge status: Home / Self Care | Payer: BLUE CROSS/BLUE SHIELD | Source: Ambulatory Visit | Attending: Internal Medicine | Admitting: Internal Medicine

## 2016-07-01 DIAGNOSIS — Z1231 Encounter for screening mammogram for malignant neoplasm of breast: Secondary | ICD-10-CM | POA: Diagnosis not present

## 2016-07-26 ENCOUNTER — Ambulatory Visit (HOSPITAL_COMMUNITY)
Admission: EM | Admit: 2016-07-26 | Discharge: 2016-07-26 | Disposition: A | Discharge status: Home / Self Care | Payer: BLUE CROSS/BLUE SHIELD | Attending: Internal Medicine | Admitting: Internal Medicine

## 2016-07-26 ENCOUNTER — Encounter (HOSPITAL_COMMUNITY): Payer: Self-pay | Admitting: *Deleted

## 2016-07-26 DIAGNOSIS — M13141 Monoarthritis, not elsewhere classified, right hand: Secondary | ICD-10-CM

## 2016-07-26 MED ORDER — NAPROXEN 500 MG PO TABS: 500.0000 mg | ORAL_TABLET | Freq: Two times a day (BID) | ORAL | 0 refills | Status: DC

## 2016-07-26 MED ORDER — PREDNISONE 50 MG PO TABS: 50.0000 mg | ORAL_TABLET | Freq: Every day | ORAL | 0 refills | Status: DC

## 2016-07-26 NOTE — Discharge Instructions (Addendum)
Swollen/painful/warm right 2nd finger seems likely to be gout or pseudogout.  Prescriptions for prednisone and naproxen (anti inflammatory/pain reliever) were sent to the [facility] on [address].  Anticipate improvement over the next 2-3 days, may take 5-7 days for complete resolution.  Recheck or followup with primary care provider if not improving as expected.  Ice for 5-10 minutes several times daily should help with swelling/pain.

## 2016-07-26 NOTE — ED Triage Notes (Signed)
Patient states about 2 days ago she started having pain and swelling to right pointer finger, MIP joint is significantly swollen. No injury. Does repetitive motions at work.

## 2016-07-26 NOTE — ED Provider Notes (Signed)
[facility]    CSN: [id number] Arrival date & time: 07/26/16  4098     History   Chief Complaint Chief Complaint  Patient presents with  . Hand Pain    HPI Andrea [name] is a 66 y.o. female. Presents today with 2d hx pain in right 2nd finger, today with increased pain and swelling/redness/warmth, most pronounced at right 2nd PIP joint.  Really hurts to move finger.  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  Doesn't want to miss work though.  No family hx arthritis.  No malaise/fever.      HPI  Past Medical History:  Diagnosis Date  . Allergic rhinitis, cause unspecified 08/07/2012  . COPD 03/23/2008  . Cough 11/05/2007  . HYPERTENSION 02/10/2007  . MUSCLE CRAMPS 08/09/2009  . OSTEOPOROSIS 02/10/2007  . Other and unspecified hyperlipidemia 08/07/2012  . PEPTIC ULCER DISEASE 03/23/2008  . PNEUMONIA, RIGHT UPPER LOBE 03/23/2008  . RESTLESS LEG SYNDROME 03/23/2008  . Unspecified disorder of thyroid 11/16/2007    Patient Active Problem List   Diagnosis Date Noted  . AKI (acute kidney injury) (HCC) 02/23/2016  . Osteoarthritis, hand 01/18/2015  . Bilateral knee pain 01/18/2015  . Rash and nonspecific skin eruption 12/23/2014  . TIA (transient ischemic attack) 02/11/2014  . Allergic rhinitis 08/07/2012  . Thrush 08/07/2012  . Hyperlipidemia 08/07/2012  . Preventative health care 12/09/2010  . MUSCLE CRAMPS 08/09/2009  . RESTLESS LEG SYNDROME 03/23/2008  . PNEUMONIA, RIGHT UPPER LOBE 03/23/2008  . COPD (chronic obstructive pulmonary disease) (HCC) 03/23/2008  . Peptic ulcer 03/23/2008  . Unspecified disorder of thyroid 11/16/2007  . Essential hypertension 02/10/2007  . OSTEOPOROSIS 02/10/2007    Past Surgical History:  Procedure Laterality Date  . Breast Biopsy negative  1972     Home Medications    Prior to Admission medications   Medication Sig Start Date End Date Taking? Authorizing Provider  aspirin EC 81  MG tablet Take 1 tablet (81 mg total) by mouth daily. 02/12/14  Yes Ripudeep Jenna Luo, MD  atorvastatin (LIPITOR) 10 MG tablet Take 1 tablet (10 mg total) by mouth at bedtime. 02/23/16  Yes Corwin Levins, MD  Fluticasone-Salmeterol (ADVAIR) 250-50 MCG/DOSE AEPB Inhale 1 puff into the lungs 2 (two) times daily as needed (COPD). 02/23/16  Yes Corwin Levins, MD  potassium chloride (K-DUR) 10 MEQ tablet Take 1 tablet (10 mEq total) by mouth daily. 02/23/16  Yes Corwin Levins, MD  triamterene-hydrochlorothiazide (MAXZIDE-25) 37.5-25 MG tablet Take 1 tablet by mouth daily. 02/23/16  Yes Corwin Levins, MD  ALPRAZolam Prudy Feeler) 0.25 MG tablet Take 1 tablet (0.25 mg total) by mouth at bedtime as needed for anxiety. 02/24/14   Corwin Levins, MD  naproxen (NAPROSYN) 500 MG tablet Take 1 tablet (500 mg total) by mouth 2 (two) times daily. 07/26/16   Eustace Moore, MD  predniSONE (DELTASONE) 50 MG tablet Take 1 tablet (50 mg total) by mouth daily. 07/26/16   Eustace Moore, MD    Family History Family History  Problem Relation Age of Onset  . Leukemia Father     Social History Social History  Substance Use Topics  . Smoking status: Former Games developer  . Smokeless tobacco: Never Used     Comment: since 2009  . Alcohol use Yes     Allergies   Alendronate sodium; Zyrtec [cetirizine]; and Sertraline hcl   Review of Systems Review of Systems  All other systems reviewed and are negative.    Physical Exam Triage Vital Signs ED Triage Vitals  Enc Vitals Group     BP 07/26/16 1016 (!) 166/94     Pulse Rate 07/26/16 1016 83     Resp 07/26/16 1016 17     Temp 07/26/16 1016 98.5 F (36.9 C)     Temp Source 07/26/16 1016 Oral     SpO2 07/26/16 1016 98 %     Weight --      Height --      Pain Score 07/26/16 1017 9     Pain Loc --    Updated Vital Signs BP (!) 166/94 (BP Location: Left Arm)   Pulse 83   Temp 98.5 F (36.9 C) (Oral)   Resp 17   SpO2 98%   Physical Exam  Constitutional: She is oriented to  person, place, and time. No distress.  Alert, nicely groomed  HENT:  Head: Atraumatic.  Eyes:  Conjugate gaze, no eye redness/drainage  Neck: Neck supple.  Cardiovascular: Normal rate.   Pulmonary/Chest: No respiratory distress.  Abdominal: She exhibits no distension.  Musculoskeletal: Normal range of motion.  No leg swelling Right 2nd finger warm to touch, diffuse slight swelling with marked swelling at PIP, tenderness, slightly boggy.  Slight erythematous.  Resists flexing finger due to pain.  DIPs mildly enlarged but not inflamed throughout.    Neurological: She is alert and oriented to person, place, and time.  Skin: Skin is warm and dry.  No cyanosis  Nursing note and vitals reviewed.    UC Treatments / Results   Procedures Procedures (including critical care time) None today  Final Clinical Impressions(s) / UC Diagnoses   Final diagnoses:  Monoarthritis of right hand   Swollen/painful/warm right 2nd finger seems likely to be gout or pseudogout.  Prescriptions for prednisone and naproxen (anti inflammatory/pain reliever) [facility] on [address].  Anticipate improvement over the next 2-3 days, may take 5-7 days for complete resolution.  Recheck or followup with primary care provider if not improving as expected.  Ice for 5-10 minutes several times daily should help with swelling/pain.    New Prescriptions New Prescriptions   NAPROXEN (NAPROSYN) 500 MG TABLET    Take 1 tablet (500 mg total) by mouth 2 (two) times daily.   PREDNISONE (DELTASONE) 50 MG TABLET    Take 1 tablet (50 mg total) by mouth daily.     Eustace Moore, MD 07/26/16 2156

## 2016-09-10 ENCOUNTER — Encounter: Payer: Self-pay | Admitting: Internal Medicine

## 2016-10-08 DIAGNOSIS — I10 Essential (primary) hypertension: Secondary | ICD-10-CM | POA: Diagnosis not present

## 2016-10-08 DIAGNOSIS — J309 Allergic rhinitis, unspecified: Secondary | ICD-10-CM | POA: Diagnosis not present

## 2016-10-22 DIAGNOSIS — M545 Low back pain: Secondary | ICD-10-CM | POA: Diagnosis not present

## 2016-11-26 DIAGNOSIS — M791 Myalgia: Secondary | ICD-10-CM | POA: Diagnosis not present

## 2016-11-26 DIAGNOSIS — M545 Low back pain: Secondary | ICD-10-CM | POA: Diagnosis not present

## 2016-11-27 ENCOUNTER — Ambulatory Visit (INDEPENDENT_AMBULATORY_CARE_PROVIDER_SITE_OTHER): Payer: BLUE CROSS/BLUE SHIELD | Admitting: Internal Medicine

## 2016-11-27 ENCOUNTER — Encounter: Payer: Self-pay | Admitting: Internal Medicine

## 2016-11-27 VITALS — BP 128/78 | HR 100 | Ht 64.0 in | Wt 116.0 lb

## 2016-11-27 DIAGNOSIS — J449 Chronic obstructive pulmonary disease, unspecified: Secondary | ICD-10-CM

## 2016-11-27 DIAGNOSIS — I10 Essential (primary) hypertension: Secondary | ICD-10-CM

## 2016-11-27 DIAGNOSIS — M545 Low back pain, unspecified: Secondary | ICD-10-CM | POA: Insufficient documentation

## 2016-11-27 MED ORDER — NAPROXEN 500 MG PO TABS: 500.0000 mg | ORAL_TABLET | Freq: Two times a day (BID) | ORAL | 1 refills | Status: DC

## 2016-11-27 NOTE — Progress Notes (Signed)
Subjective:    Patient ID: Andrea McalpineRhonda J [name], female    DOB: 04-11-1951, 66 y.o.   MRN: [id number]  HPI  Here to f/u back; started October 10 2016 at work, called rep at work, tx with [employer], left early the next day due to worsening pain; the mon after did see the workman's comp MD with dx per pt of MSK strain/pulled muscle; allowed off work for 3 days (june 25/26/27), back to work after, had f/u July 2 and released back to work without restriction; unfortunately had pain start back up Friday last, and seen per [employer] again Mon 8/6 with recurrent though less severe pain per pt, tx with predpac and flexeril.  This time curretnly pain is in the right lower back, mild to mod (not severe). Constant, worse with getting up from sitting, but after a few steps the pain seems to improved with walking; pain radiates to RLE to knee, now less painful this time but still tingling, and no weakness involved that she can tell  Ice helps, nothing else makes better or worse  No falls or leg giveaways . Job involves much of tiwsting, turning, bending and pulling and even occas squatting and crawling.   Denies urinary symptoms such as dysuria, frequency, urgency, flank pain, hematuria or n/v, fever, chills.  Denies worsening reflux, abd pain, dysphagia, n/v, bowel change or blood. Wore back brace and worked all day.  Pt was asked by work to come here for another opinion to make sure no other overt health condition to account for pain. She is currentlty on light duty per [employer]  Asks for letter with results for her work today. Out of naproxyn, asks for refill.  Also with ongoing insomnia, hard to get to sleep every night for months, xanax has worked in past Past Medical History:  Diagnosis Date  . Allergic rhinitis, cause unspecified 08/07/2012  . COPD 03/23/2008  . Cough 11/05/2007  . HYPERTENSION 02/10/2007  . MUSCLE CRAMPS 08/09/2009  . OSTEOPOROSIS 02/10/2007  . Other and unspecified hyperlipidemia 08/07/2012  .  PEPTIC ULCER DISEASE 03/23/2008  . PNEUMONIA, RIGHT UPPER LOBE 03/23/2008  . RESTLESS LEG SYNDROME 03/23/2008  . Unspecified disorder of thyroid 11/16/2007   Past Surgical History:  Procedure Laterality Date  . Breast Biopsy negative  1972    reports that she has quit smoking. She has never used smokeless tobacco. She reports that she drinks alcohol. She reports that she does not use drugs. family history includes Leukemia in her father. Allergies  Allergen Reactions  . Alendronate Sodium Nausea Only    GI upset  . Zyrtec [Cetirizine] Itching, Rash and Other (See Comments)    Tongue burning, thrush  . Sertraline Hcl Other (See Comments)    unknown   Current Outpatient Prescriptions on File Prior to Visit  Medication Sig Dispense Refill  . ALPRAZolam (XANAX) 0.25 MG tablet Take 1 tablet (0.25 mg total) by mouth at bedtime as needed for anxiety. 30 tablet 2  . aspirin EC 81 MG tablet Take 1 tablet (81 mg total) by mouth daily. 30 tablet 5  . atorvastatin (LIPITOR) 10 MG tablet Take 1 tablet (10 mg total) by mouth at bedtime. 90 tablet 3  . Fluticasone-Salmeterol (ADVAIR) 250-50 MCG/DOSE AEPB Inhale 1 puff into the lungs 2 (two) times daily as needed (COPD). 180 each 3  . naproxen (NAPROSYN) 500 MG tablet Take 1 tablet (500 mg total) by mouth 2 (two) times daily. 14 tablet 0  . potassium  chloride (K-DUR) 10 MEQ tablet Take 1 tablet (10 mEq total) by mouth daily. 90 tablet 3  . predniSONE (DELTASONE) 50 MG tablet Take 1 tablet (50 mg total) by mouth daily. 3 tablet 0  . triamterene-hydrochlorothiazide (MAXZIDE-25) 37.5-25 MG tablet Take 1 tablet by mouth daily. 90 tablet 3   No current facility-administered medications on file prior to visit.    Review of Systems  Constitutional: Negative for other unusual diaphoresis or sweats HENT: Negative for ear discharge or swelling Eyes: Negative for other worsening visual disturbances Respiratory: Negative for stridor or other swelling    Gastrointestinal: Negative for worsening distension or other blood Genitourinary: Negative for retention or other urinary change Musculoskeletal: Negative for other MSK pain or swelling Skin: Negative for color change or other new lesions Neurological: Negative for worsening tremors and other numbness  Psychiatric/Behavioral: Negative for worsening agitation or other fatigue All other system neg per pt    Objective:   Physical Exam BP 128/78   Pulse 100   Ht 5\' 4"  (1.626 m)   Wt 116 lb (52.6 kg)   SpO2 100%   BMI 19.91 kg/m  VS noted, not ill appearing Constitutional: Pt appears in NAD HENT: Head: NCAT.  Right Ear: External ear normal.  Left Ear: External ear normal.  Eyes: . Pupils are equal, round, and reactive to light. Conjunctivae and EOM are normal Nose: without d/c or deformity Neck: Neck supple. Gross normal ROM Cardiovascular: Normal rate and regular rhythm.   Pulmonary/Chest: Effort normal and breath sounds without rales or wheezing.  Abd:  Soft, NT, ND, + BS, no organomegaly Neurological: Pt is alert. At baseline orientation, motor grossly intact Skin: Skin is warm. No rashes, other new lesions, no LE edema Psychiatric: Pt behavior is normal without agitation  MSK: right lumbar paravertebral spasm, spine nontender    Assessment & Plan:

## 2016-11-27 NOTE — Patient Instructions (Signed)
Please continue all other medications as before, including the naproxyn refilled today, the prednisone and the muscle relaxer as needed  Please have the pharmacy call with any other refills you may need.  Please continue your efforts at being more active, low cholesterol diet, and weight control.  Please keep your appointments with your specialists as you may have planned  You are given the letter today

## 2016-11-28 ENCOUNTER — Other Ambulatory Visit: Payer: Self-pay | Admitting: Internal Medicine

## 2016-11-28 MED ORDER — ALPRAZOLAM 0.25 MG PO TABS: 0.2500 mg | ORAL_TABLET | Freq: Every evening | ORAL | 2 refills | Status: DC | PRN

## 2016-11-28 NOTE — Telephone Encounter (Signed)
Done hardcopy to [facility]  

## 2016-11-28 NOTE — Telephone Encounter (Signed)
faxed

## 2016-11-28 NOTE — Telephone Encounter (Signed)
Pt called and stated she was seen yesterday and thought she was supposed to receive a refill of ALPRAZolam (XANAX) 0.25 MG tablet  But did not, she would like a refill  Please advise

## 2016-11-29 NOTE — Assessment & Plan Note (Signed)
stable overall by history and exam, and pt to continue medical treatment as before,  to f/u any worsening symptoms or concerns 

## 2016-11-29 NOTE — Assessment & Plan Note (Addendum)
Exam c/w msk strain, for refill naproxyn prn, muscle relaxer prn, and predpac asd, letter given to state no other significant acute illness found, to return to work as per [employer]

## 2016-11-29 NOTE — Assessment & Plan Note (Signed)
stable overall by history and exam, recent data reviewed with pt, and pt to continue medical treatment as before,  to f/u any worsening symptoms or concerns BP Readings from Last 3 Encounters:  11/27/16 128/78  07/26/16 (!) 166/94  02/23/16 118/70

## 2016-12-10 DIAGNOSIS — M791 Myalgia: Secondary | ICD-10-CM | POA: Diagnosis not present

## 2016-12-10 DIAGNOSIS — M545 Low back pain: Secondary | ICD-10-CM | POA: Diagnosis not present

## 2017-02-26 ENCOUNTER — Ambulatory Visit: Payer: BLUE CROSS/BLUE SHIELD | Admitting: Internal Medicine

## 2017-02-26 ENCOUNTER — Encounter: Payer: Self-pay | Admitting: Internal Medicine

## 2017-02-26 VITALS — BP 134/90 | HR 88 | Temp 98.1°F | Ht 64.0 in | Wt 109.0 lb

## 2017-02-26 DIAGNOSIS — F419 Anxiety disorder, unspecified: Secondary | ICD-10-CM | POA: Diagnosis not present

## 2017-02-26 DIAGNOSIS — F329 Major depressive disorder, single episode, unspecified: Secondary | ICD-10-CM

## 2017-02-26 DIAGNOSIS — J449 Chronic obstructive pulmonary disease, unspecified: Secondary | ICD-10-CM

## 2017-02-26 DIAGNOSIS — E785 Hyperlipidemia, unspecified: Secondary | ICD-10-CM | POA: Diagnosis not present

## 2017-02-26 DIAGNOSIS — I10 Essential (primary) hypertension: Secondary | ICD-10-CM | POA: Diagnosis not present

## 2017-02-26 DIAGNOSIS — Z0000 Encounter for general adult medical examination without abnormal findings: Secondary | ICD-10-CM | POA: Diagnosis not present

## 2017-02-26 MED ORDER — TRIAMTERENE-HCTZ 37.5-25 MG PO TABS: 1.0000 | ORAL_TABLET | Freq: Every day | ORAL | 3 refills | Status: DC

## 2017-02-26 MED ORDER — POTASSIUM CHLORIDE ER 10 MEQ PO TBCR: 10.0000 meq | EXTENDED_RELEASE_TABLET | Freq: Every day | ORAL | 3 refills | Status: DC

## 2017-02-26 MED ORDER — ATORVASTATIN CALCIUM 10 MG PO TABS: 10.0000 mg | ORAL_TABLET | Freq: Every day | ORAL | 3 refills | Status: DC

## 2017-02-26 MED ORDER — ESCITALOPRAM OXALATE 10 MG PO TABS: 10.0000 mg | ORAL_TABLET | Freq: Every day | ORAL | 3 refills | Status: DC

## 2017-02-26 MED ORDER — ESCITALOPRAM OXALATE 10 MG PO TABS: 10.0000 mg | ORAL_TABLET | Freq: Every day | ORAL | 0 refills | Status: DC

## 2017-02-26 MED ORDER — FLUTICASONE-SALMETEROL 250-50 MCG/DOSE IN AEPB: 1.0000 | INHALATION_SPRAY | Freq: Two times a day (BID) | RESPIRATORY_TRACT | 3 refills | Status: DC | PRN

## 2017-02-26 NOTE — Progress Notes (Addendum)
Subjective:    Patient ID: Andrea McalpineRhonda J [name], female    DOB: 12/03/1950, 66 y.o.   MRN: [id number]  HPI  Here to f/u; overall doing ok,  Pt denies chest pain, increasing sob or doe, wheezing, orthopnea, PND, increased LE swelling, palpitations, dizziness or syncope.  Pt denies new neurological symptoms such as new headache, or facial or extremity weakness or numbness.  Pt denies polydipsia, polyuria, or low sugar episode.  Pt states overall good compliance with meds, mostly trying to follow appropriate diet, with wt overall stable,  but little exercise however. Still working full time.   Sister with lung ca end stage, her husband now on dialysis,  And pt husband now with new CHF, and now son with lung mass [facility].  She has had FMLA in past for her husbands care, and she has been mild worse depressed and nervous recently and being overwhelmed.  Needs 1 day per wk excuse for husband and stress.   Past Medical History:  Diagnosis Date  . Allergic rhinitis, cause unspecified 08/07/2012  . COPD 03/23/2008  . Cough 11/05/2007  . HYPERTENSION 02/10/2007  . MUSCLE CRAMPS 08/09/2009  . OSTEOPOROSIS 02/10/2007  . Other and unspecified hyperlipidemia 08/07/2012  . PEPTIC ULCER DISEASE 03/23/2008  . PNEUMONIA, RIGHT UPPER LOBE 03/23/2008  . RESTLESS LEG SYNDROME 03/23/2008  . Unspecified disorder of thyroid 11/16/2007   Past Surgical History:  Procedure Laterality Date  . Breast Biopsy negative  1972    reports that she has quit smoking. she has never used smokeless tobacco. She reports that she drinks alcohol. She reports that she does not use drugs. family history includes Leukemia in her father. Allergies  Allergen Reactions  . Alendronate Sodium Nausea Only    GI upset  . Zyrtec [Cetirizine] Itching, Rash and Other (See Comments)    Tongue burning, thrush  . Sertraline Hcl Other (See Comments)    unknown   Current Outpatient Medications on File Prior to Visit  Medication Sig Dispense  Refill  . ALPRAZolam (XANAX) 0.25 MG tablet Take 1 tablet (0.25 mg total) by mouth at bedtime as needed for anxiety. 30 tablet 2  . aspirin EC 81 MG tablet Take 1 tablet (81 mg total) by mouth daily. 30 tablet 5  . naproxen (NAPROSYN) 500 MG tablet Take 1 tablet (500 mg total) by mouth 2 (two) times daily. 60 tablet 1   No current facility-administered medications on file prior to visit.    Review of Systems  Constitutional: Negative for other unusual diaphoresis or sweats HENT: Negative for ear discharge or swelling Eyes: Negative for other worsening visual disturbances Respiratory: Negative for stridor or other swelling  Gastrointestinal: Negative for worsening distension or other blood Genitourinary: Negative for retention or other urinary change Musculoskeletal: Negative for other MSK pain or swelling Skin: Negative for color change or other new lesions Neurological: Negative for worsening tremors and other numbness  Psychiatric/Behavioral: Negative for worsening agitation or other fatigue All other system neg per pt    Objective:   Physical Exam BP 134/90   Pulse 88   Temp 98.1 F (36.7 C) (Oral)   Ht 5\' 4"  (1.626 m)   Wt 109 lb (49.4 kg)   SpO2 99%   BMI 18.71 kg/m  VS noted,  Constitutional: Pt appears in NAD HENT: Head: NCAT.  Right Ear: External ear normal.  Left Ear: External ear normal.  Eyes: . Pupils are equal, round, and reactive to light. Conjunctivae and EOM are  normal Nose: without d/c or deformity Neck: Neck supple. Gross normal ROM Cardiovascular: Normal rate and regular rhythm.   Pulmonary/Chest: Effort normal and breath sounds without rales or wheezing.  Abd:  Soft, NT, ND, + BS, no organomegaly Neurological: Pt is alert. At baseline orientation, motor grossly intact Skin: Skin is warm. No rashes, other new lesions, no LE edema Psychiatric: Pt behavior is normal without agitation , + depressed nervous affect No other exam findings    Assessment &  Plan:

## 2017-02-26 NOTE — Patient Instructions (Signed)
Please take all new medication as prescribed - the lexapro 10 mg per day  Please continue all other medications as before, and refills have been done if requested.  Please have the pharmacy call with any other refills you may need.  Please keep your appointments with your specialists as you may have planned  Please return in 1 month, or sooner if needed, with Lab testing done 3-5 days before

## 2017-02-28 ENCOUNTER — Telehealth: Payer: Self-pay | Admitting: Internal Medicine

## 2017-02-28 NOTE — Telephone Encounter (Signed)
I have received FMLA via fax for patient from [employer]. It say for a serious health condition. I will have to contact the patient and find out more information unless you are aware of this and what it is for. Please advise.

## 2017-03-01 NOTE — Assessment & Plan Note (Signed)
stable overall by history and exam, recent data reviewed with pt, and pt to continue medical treatment as before,  to f/u any worsening symptoms or concerns Lab Results  Component Value Date   LDLCALC 60 02/23/2016

## 2017-03-01 NOTE — Assessment & Plan Note (Signed)
stable overall by history and exam, recent data reviewed with pt, and pt to continue medical treatment as before,  to f/u any worsening symptoms or concerns BP Readings from Last 3 Encounters:  02/26/17 134/90  11/27/16 128/78  07/26/16 (!) 166/94

## 2017-03-01 NOTE — Assessment & Plan Note (Signed)
stable overall by history and exam, and pt to continue medical treatment as before,  to f/u any worsening symptoms or concerns 

## 2017-03-01 NOTE — Assessment & Plan Note (Signed)
Mild to mod, declines counseling referral, ok for lexapro 10 qd

## 2017-03-03 NOTE — Telephone Encounter (Signed)
I am not aware of any FLMA but I have attached Dr. Jonny RuizJohn to see if he knows what condition it is for.

## 2017-03-03 NOTE — Telephone Encounter (Signed)
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

## 2017-03-06 NOTE — Telephone Encounter (Signed)
I have spoke with patient and informed her of these notes. She states just forget it she is fine. She does not want a referral to see anyone.   I informed her if she changed her mind and would like one, you would be more than happy to send her a referral over for some counseling or psychology.

## 2017-03-07 NOTE — Telephone Encounter (Signed)
I have received [employer] forms again. I have already informed patient Dr.John will not be filling out the forms and can refer her. I have faxed the forms back.

## 2017-03-25 ENCOUNTER — Other Ambulatory Visit: Payer: Self-pay | Admitting: Internal Medicine

## 2017-03-25 NOTE — Telephone Encounter (Signed)
Done erx to [facility] 

## 2017-04-01 ENCOUNTER — Ambulatory Visit: Payer: BLUE CROSS/BLUE SHIELD | Admitting: Internal Medicine

## 2017-04-09 ENCOUNTER — Other Ambulatory Visit (INDEPENDENT_AMBULATORY_CARE_PROVIDER_SITE_OTHER): Payer: BLUE CROSS/BLUE SHIELD

## 2017-04-09 ENCOUNTER — Encounter: Payer: Self-pay | Admitting: Internal Medicine

## 2017-04-09 ENCOUNTER — Ambulatory Visit: Payer: BLUE CROSS/BLUE SHIELD | Admitting: Internal Medicine

## 2017-04-09 VITALS — BP 120/84 | HR 84 | Temp 97.6°F | Ht 64.0 in | Wt 112.0 lb

## 2017-04-09 DIAGNOSIS — E785 Hyperlipidemia, unspecified: Secondary | ICD-10-CM

## 2017-04-09 DIAGNOSIS — Z23 Encounter for immunization: Secondary | ICD-10-CM

## 2017-04-09 DIAGNOSIS — Z0000 Encounter for general adult medical examination without abnormal findings: Secondary | ICD-10-CM

## 2017-04-09 LAB — CBC WITH DIFFERENTIAL/PLATELET
BASOS ABS: 0 10*3/uL (ref 0.0–0.1)
Basophils Relative: 0.2 % (ref 0.0–3.0)
EOS PCT: 7 % — AB (ref 0.0–5.0)
Eosinophils Absolute: 0.3 10*3/uL (ref 0.0–0.7)
HCT: 41.3 % (ref 36.0–46.0)
HEMOGLOBIN: 14 g/dL (ref 12.0–15.0)
Lymphocytes Relative: 32.9 % (ref 12.0–46.0)
Lymphs Abs: 1.2 10*3/uL (ref 0.7–4.0)
MCHC: 34 g/dL (ref 30.0–36.0)
MCV: 98.2 fl (ref 78.0–100.0)
MONO ABS: 0.4 10*3/uL (ref 0.1–1.0)
Monocytes Relative: 9.9 % (ref 3.0–12.0)
NEUTROS PCT: 50 % (ref 43.0–77.0)
Neutro Abs: 1.8 10*3/uL (ref 1.4–7.7)
Platelets: 300 10*3/uL (ref 150.0–400.0)
RBC: 4.2 Mil/uL (ref 3.87–5.11)
RDW: 13.1 % (ref 11.5–15.5)
WBC: 3.7 10*3/uL — AB (ref 4.0–10.5)

## 2017-04-09 LAB — URINALYSIS, ROUTINE W REFLEX MICROSCOPIC
Bilirubin Urine: NEGATIVE
HGB URINE DIPSTICK: NEGATIVE
KETONES UR: NEGATIVE
LEUKOCYTES UA: NEGATIVE
NITRITE: NEGATIVE
SPECIFIC GRAVITY, URINE: 1.015 (ref 1.000–1.030)
Total Protein, Urine: NEGATIVE
UROBILINOGEN UA: 1 (ref 0.0–1.0)
Urine Glucose: NEGATIVE
pH: 7 (ref 5.0–8.0)

## 2017-04-09 LAB — BASIC METABOLIC PANEL
BUN: 29 mg/dL — AB (ref 6–23)
CHLORIDE: 102 meq/L (ref 96–112)
CO2: 27 meq/L (ref 19–32)
CREATININE: 1.18 mg/dL (ref 0.40–1.20)
Calcium: 9.4 mg/dL (ref 8.4–10.5)
GFR: 59 mL/min — ABNORMAL LOW (ref >60.00)
Glucose, Bld: 73 mg/dL (ref 70–99)
POTASSIUM: 3.8 meq/L (ref 3.5–5.1)
Sodium: 141 mEq/L (ref 135–145)

## 2017-04-09 LAB — LIPID PANEL
CHOL/HDL RATIO: 2
CHOLESTEROL: 161 mg/dL (ref 0–200)
HDL: 79.6 mg/dL (ref >39.00)
LDL CALC: 53 mg/dL (ref 0–99)
NonHDL: 81.73
Triglycerides: 145 mg/dL (ref 0.0–149.0)
VLDL: 29 mg/dL (ref 0.0–40.0)

## 2017-04-09 LAB — HEPATIC FUNCTION PANEL
ALK PHOS: 75 U/L (ref 39–117)
ALT: 14 U/L (ref 0–35)
AST: 23 U/L (ref 0–37)
Albumin: 4.3 g/dL (ref 3.5–5.2)
BILIRUBIN DIRECT: 0.2 mg/dL (ref 0.0–0.3)
BILIRUBIN TOTAL: 0.8 mg/dL (ref 0.2–1.2)
TOTAL PROTEIN: 8.2 g/dL (ref 6.0–8.3)

## 2017-04-09 LAB — TSH: TSH: 1.25 u[IU]/mL (ref 0.35–4.50)

## 2017-04-09 NOTE — Patient Instructions (Addendum)
You had the Prevnar 13 pneumonia shot today  Please continue all other medications as before, and refills have been done if requested.  Please have the pharmacy call with any other refills you may need.  Please continue your efforts at being more active, low cholesterol diet, and weight control.  You are otherwise up to date with prevention measures today.  Please keep your appointments with your specialists as you may have planned  Please go to the LAB in the Basement (turn left off the elevator) for the tests to be done today  You will be contacted by phone if any changes need to be made immediately.  Otherwise, you will receive a letter about your results with an explanation, but please check with MyChart first.  Please remember to sign up for MyChart if you have not done so, as this will be important to you in the future with finding out test results, communicating by private email, and scheduling acute appointments online when needed.  Please return in 1 year for your yearly visit, or sooner if needed, with Lab testing done 3-5 days before   

## 2017-04-09 NOTE — Progress Notes (Signed)
Subjective:    Patient ID: Andrea [name], female    DOB: 1950/12/29, 66 y.o.   MRN: [id number]  HPI  Here for wellness and f/u;  Overall doing ok;  Pt denies Chest pain, worsening SOB, DOE, wheezing, orthopnea, PND, worsening LE edema, palpitations, dizziness or syncope.  Pt denies neurological change such as new headache, facial or extremity weakness.  Pt denies polydipsia, polyuria, or low sugar symptoms. Pt states overall good compliance with treatment and medications, good tolerability, and has been trying to follow appropriate diet.  Pt denies worsening depressive symptoms, suicidal ideation or panic. No fever, night sweats, wt loss, loss of appetite, or other constitutional symptoms.  Pt states good ability with ADL's, has low fall risk, home safety reviewed and adequate, no other significant changes in hearing or vision, and only occasionally active with exercise.  Has no specific complaints or concerns  Tolerating statin well Wt Readings from Last 3 Encounters:  04/09/17 112 lb (50.8 kg)  02/26/17 109 lb (49.4 kg)  11/27/16 116 lb (52.6 kg)   BP Readings from Last 3 Encounters:  04/09/17 120/84  02/26/17 134/90  11/27/16 128/78  Sister Dimas Millin just died with met lung cancer, was a long term pt of mine Past Medical History:  Diagnosis Date  . Allergic rhinitis, cause unspecified 08/07/2012  . COPD 03/23/2008  . Cough 11/05/2007  . HYPERTENSION 02/10/2007  . MUSCLE CRAMPS 08/09/2009  . OSTEOPOROSIS 02/10/2007  . Other and unspecified hyperlipidemia 08/07/2012  . PEPTIC ULCER DISEASE 03/23/2008  . PNEUMONIA, RIGHT UPPER LOBE 03/23/2008  . RESTLESS LEG SYNDROME 03/23/2008  . Unspecified disorder of thyroid 11/16/2007   Past Surgical History:  Procedure Laterality Date  . Breast Biopsy negative  1972    reports that she has quit smoking. she has never used smokeless tobacco. She reports that she drinks alcohol. She reports that she does not use drugs. family history includes  Leukemia in her father. Allergies  Allergen Reactions  . Alendronate Sodium Nausea Only    GI upset  . Zyrtec [Cetirizine] Itching, Rash and Other (See Comments)    Tongue burning, thrush  . Sertraline Hcl Other (See Comments)    unknown   Current Outpatient Medications on File Prior to Visit  Medication Sig Dispense Refill  . ALPRAZolam (XANAX) 0.25 MG tablet Take 1 tablet (0.25 mg total) by mouth at bedtime as needed for anxiety. 30 tablet 2  . aspirin EC 81 MG tablet Take 1 tablet (81 mg total) by mouth daily. 30 tablet 5  . atorvastatin (LIPITOR) 10 MG tablet Take 1 tablet (10 mg total) at bedtime by mouth. 90 tablet 3  . escitalopram (LEXAPRO) 10 MG tablet TAKE 1 TABLET (10 MG TOTAL) DAILY BY MOUTH. 90 tablet 3  . Fluticasone-Salmeterol (ADVAIR) 250-50 MCG/DOSE AEPB Inhale 1 puff 2 (two) times daily as needed into the lungs (COPD). 180 each 3  . naproxen (NAPROSYN) 500 MG tablet Take 1 tablet (500 mg total) by mouth 2 (two) times daily. 60 tablet 1  . potassium chloride (K-DUR) 10 MEQ tablet Take 1 tablet (10 mEq total) daily by mouth. 90 tablet 3  . triamterene-hydrochlorothiazide (MAXZIDE-25) 37.5-25 MG tablet Take 1 tablet daily by mouth. 90 tablet 3   No current facility-administered medications on file prior to visit.    Review of Systems Constitutional: Negative for other unusual diaphoresis, sweats, appetite or weight changes HENT: Negative for other worsening hearing loss, ear pain, facial swelling, mouth sores or neck stiffness.  Eyes: Negative for other worsening pain, redness or other visual disturbance.  Respiratory: Negative for other stridor or swelling Cardiovascular: Negative for other palpitations or other chest pain  Gastrointestinal: Negative for worsening diarrhea or loose stools, blood in stool, distention or other pain Genitourinary: Negative for hematuria, flank pain or other change in urine volume.  Musculoskeletal: Negative for myalgias or other joint  swelling.  Skin: Negative for other color change, or other wound or worsening drainage.  Neurological: Negative for other syncope or numbness. Hematological: Negative for other adenopathy or swelling Psychiatric/Behavioral: Negative for hallucinations, other worsening agitation, SI, self-injury, or new decreased concentration All other system neg per pt    Objective:   Physical Exam BP 120/84   Pulse 84   Temp 97.6 F (36.4 C) (Oral)   Ht '5\' 4"'  (1.626 m)   Wt 112 lb (50.8 kg)   SpO2 98%   BMI 19.22 kg/m  VS noted,  Constitutional: Pt is oriented to person, place, and time. Appears well-developed and well-nourished, in no significant distress and comfortable Head: Normocephalic and atraumatic  Eyes: Conjunctivae and EOM are normal. Pupils are equal, round, and reactive to light Right Ear: External ear normal without discharge Left Ear: External ear normal without discharge Nose: Nose without discharge or deformity Mouth/Throat: Oropharynx is without other ulcerations and moist  Neck: Normal range of motion. Neck supple. No JVD present. No tracheal deviation present or significant neck LA or mass Cardiovascular: Normal rate, regular rhythm, normal heart sounds and intact distal pulses.   Pulmonary/Chest: WOB normal and breath sounds without rales or wheezing  Abdominal: Soft. Bowel sounds are normal. NT. No HSM  Musculoskeletal: Normal range of motion. Exhibits no edema Lymphadenopathy: Has no other cervical adenopathy.  Neurological: Pt is alert and oriented to person, place, and time. Pt has normal reflexes. No cranial nerve deficit. Motor grossly intact, Gait intact Skin: Skin is warm and dry. No rash noted or new ulcerations Psychiatric:  Has normal mood and affect. Behavior is normal without agitation No other exam findings Lab Results  Component Value Date   WBC 3.7 (L) 04/09/2017   HGB 14.0 04/09/2017   HCT 41.3 04/09/2017   PLT 300.0 04/09/2017   GLUCOSE 73 04/09/2017     CHOL 161 04/09/2017   TRIG 145.0 04/09/2017   HDL 79.60 04/09/2017   LDLDIRECT 111.6 12/14/2012   LDLCALC 53 04/09/2017   ALT 14 04/09/2017   AST 23 04/09/2017   NA 141 04/09/2017   K 3.8 04/09/2017   CL 102 04/09/2017   CREATININE 1.18 04/09/2017   BUN 29 (H) 04/09/2017   CO2 27 04/09/2017   TSH 1.25 04/09/2017   INR 1.00 02/11/2014   HGBA1C 5.7 (H) 02/12/2014       Assessment & Plan:

## 2017-04-12 ENCOUNTER — Encounter: Payer: Self-pay | Admitting: Internal Medicine

## 2017-04-12 NOTE — Assessment & Plan Note (Signed)
Lab Results  Component Value Date   LDLCALC 53 04/09/2017  stable overall by history and exam, recent data reviewed with pt, and pt to continue medical treatment as before,  to f/u any worsening symptoms or concerns

## 2017-04-12 NOTE — Assessment & Plan Note (Signed)

## 2017-06-03 ENCOUNTER — Other Ambulatory Visit: Payer: Self-pay | Admitting: Internal Medicine

## 2017-06-03 DIAGNOSIS — Z1231 Encounter for screening mammogram for malignant neoplasm of breast: Secondary | ICD-10-CM

## 2017-07-02 ENCOUNTER — Ambulatory Visit
Admission: RE | Admit: 2017-07-02 | Discharge: 2017-07-02 | Disposition: A | Discharge status: Home / Self Care | Payer: BLUE CROSS/BLUE SHIELD | Source: Ambulatory Visit | Attending: Internal Medicine | Admitting: Internal Medicine

## 2017-07-02 DIAGNOSIS — Z1231 Encounter for screening mammogram for malignant neoplasm of breast: Secondary | ICD-10-CM

## 2017-09-09 ENCOUNTER — Ambulatory Visit: Payer: BLUE CROSS/BLUE SHIELD | Admitting: Family

## 2017-09-09 ENCOUNTER — Encounter: Payer: Self-pay | Admitting: Family

## 2017-09-09 VITALS — BP 120/82 | HR 105 | Temp 98.0°F | Ht 64.0 in | Wt 106.1 lb

## 2017-09-09 DIAGNOSIS — M5441 Lumbago with sciatica, right side: Secondary | ICD-10-CM | POA: Diagnosis not present

## 2017-09-09 MED ORDER — PREDNISONE 20 MG PO TABS: 40.0000 mg | ORAL_TABLET | Freq: Every day | ORAL | 0 refills | Status: DC

## 2017-09-09 MED ORDER — TRAMADOL HCL 50 MG PO TABS: 50.0000 mg | ORAL_TABLET | Freq: Three times a day (TID) | ORAL | 0 refills | Status: DC | PRN

## 2017-09-09 MED ORDER — METHYLPREDNISOLONE ACETATE 40 MG/ML IJ SUSP
40.0000 mg | Freq: Once | INTRAMUSCULAR | Status: AC
  Administered 2017-09-09: 40 mg via INTRAMUSCULAR

## 2017-09-09 MED ORDER — METHOCARBAMOL 500 MG PO TABS: 500.0000 mg | ORAL_TABLET | Freq: Three times a day (TID) | ORAL | 0 refills | Status: DC | PRN

## 2017-09-09 NOTE — Progress Notes (Signed)
Andrea [name] is a 67 y.o. female with the following history as recorded in EpicCare:  Patient Active Problem List   Diagnosis Date Noted  . Anxiety and depression 02/26/2017  . Low back pain 11/27/2016  . AKI (acute kidney injury) (HCC) 02/23/2016  . Osteoarthritis, hand 01/18/2015  . Bilateral knee pain 01/18/2015  . Rash and nonspecific skin eruption 12/23/2014  . TIA (transient ischemic attack) 02/11/2014  . Allergic rhinitis 08/07/2012  . Thrush 08/07/2012  . Hyperlipidemia 08/07/2012  . Preventative health care 12/09/2010  . MUSCLE CRAMPS 08/09/2009  . RESTLESS LEG SYNDROME 03/23/2008  . PNEUMONIA, RIGHT UPPER LOBE 03/23/2008  . COPD (chronic obstructive pulmonary disease) (HCC) 03/23/2008  . Peptic ulcer 03/23/2008  . Unspecified disorder of thyroid 11/16/2007  . Essential hypertension 02/10/2007  . OSTEOPOROSIS 02/10/2007    Current Outpatient Medications  Medication Sig Dispense Refill  . ALPRAZolam (XANAX) 0.25 MG tablet Take 1 tablet (0.25 mg total) by mouth at bedtime as needed for anxiety. 30 tablet 2  . aspirin EC 81 MG tablet Take 1 tablet (81 mg total) by mouth daily. 30 tablet 5  . atorvastatin (LIPITOR) 10 MG tablet Take 1 tablet (10 mg total) at bedtime by mouth. 90 tablet 3  . Fluticasone-Salmeterol (ADVAIR) 250-50 MCG/DOSE AEPB Inhale 1 puff 2 (two) times daily as needed into the lungs (COPD). 180 each 3  . naproxen (NAPROSYN) 500 MG tablet Take 1 tablet (500 mg total) by mouth 2 (two) times daily. 60 tablet 1  . potassium chloride (K-DUR) 10 MEQ tablet Take 1 tablet (10 mEq total) daily by mouth. 90 tablet 3  . triamterene-hydrochlorothiazide (MAXZIDE-25) 37.5-25 MG tablet Take 1 tablet daily by mouth. 90 tablet 3  . escitalopram (LEXAPRO) 10 MG tablet TAKE 1 TABLET (10 MG TOTAL) DAILY BY MOUTH. 90 tablet 3  . methocarbamol (ROBAXIN) 500 MG tablet Take 1 tablet (500 mg total) by mouth every 8 (eight) hours as needed. 30 tablet 0  . predniSONE (DELTASONE)  20 MG tablet Take 2 tablets (40 mg total) by mouth daily with breakfast. 10 tablet 0  . traMADol (ULTRAM) 50 MG tablet Take 1 tablet (50 mg total) by mouth every 8 (eight) hours as needed. 20 tablet 0   No current facility-administered medications for this visit.     Allergies: Alendronate sodium; Zyrtec [cetirizine]; and Sertraline hcl  Past Medical History:  Diagnosis Date  . Allergic rhinitis, cause unspecified 08/07/2012  . COPD 03/23/2008  . Cough 11/05/2007  . HYPERTENSION 02/10/2007  . MUSCLE CRAMPS 08/09/2009  . OSTEOPOROSIS 02/10/2007  . Other and unspecified hyperlipidemia 08/07/2012  . PEPTIC ULCER DISEASE 03/23/2008  . PNEUMONIA, RIGHT UPPER LOBE 03/23/2008  . RESTLESS LEG SYNDROME 03/23/2008  . Unspecified disorder of thyroid 11/16/2007    Past Surgical History:  Procedure Laterality Date  . Breast Biopsy negative  1972    Family History  Problem Relation Age of Onset  . Leukemia Father     Social History   Tobacco Use  . Smoking status: Former Games developer  . Smokeless tobacco: Never Used  . Tobacco comment: since 2009  Substance Use Topics  . Alcohol use: Yes    Subjective:  Patient presents with concerns for recurrent low back pain with radiating symptoms; has history of back injury which occurred at work last summer; has had at least one other episode since that time; complaining of right sided back pain radiating down into her right lower leg; no changes in bowel or bladder habits; has  been using OTC Naproxen with some benefit; requesting prednisone and muscle relaxer if possible; also needs work note for this week; does not remember if she has ever had imaging done of her back;   Objective:  Vitals:   09/09/17 1022  BP: 120/82  Pulse: (!) 105  Temp: 98 F (36.7 C)  TempSrc: Oral  SpO2: 97%  Weight: 106 lb 1.3 oz (48.1 kg)  Height:  (1.626 m)    General: Well developed, well nourished, in mild distress  Skin : Warm and dry.  Head: Normocephalic and  atraumatic  Lungs: Respirations unlabored; clear to auscultation bilaterally without wheeze, rales, rhonchi  CVS exam: normal rate and regular rhythm.  Musculoskeletal: No deformities; no active joint inflammation  Extremities: No edema, cyanosis, clubbing  Vessels: Symmetric bilaterally  Neurologic: Alert and oriented; speech intact; face symmetrical; moves all extremities well; CNII-XII intact without focal deficit   Assessment:  1. Acute right-sided low back pain with right-sided sciatica     Plan:  Will update lumbar X-ray; Depo-Medrol IM 40 mg given in office today; start oral prednisone 40 mg tomorrow x 5 days; Rx for Robaxin and Tramadol to help with pain; work note given as requested; may need to refer to back specialist or PT; follow-up to be determined.   No follow-ups on file.  Orders Placed This Encounter  Procedures  . DG Lumbar Spine 2-3 Views    Standing Status:   Future    Standing Expiration Date:   11/10/2018    Order Specific Question:   Reason for Exam (SYMPTOM  OR DIAGNOSIS REQUIRED)    Answer:   low back pain    Order Specific Question:   Preferred imaging location?    Answer:   [facility]    Order Specific Question:   Radiology Contrast Protocol - do NOT remove file path    Answer:   \\[email or web address].pdf    Requested Prescriptions   Signed Prescriptions Disp Refills  . methocarbamol (ROBAXIN) 500 MG tablet 30 tablet 0    Sig: Take 1 tablet (500 mg total) by mouth every 8 (eight) hours as needed.  . predniSONE (DELTASONE) 20 MG tablet 10 tablet 0    Sig: Take 2 tablets (40 mg total) by mouth daily with breakfast.  . traMADol (ULTRAM) 50 MG tablet 20 tablet 0    Sig: Take 1 tablet (50 mg total) by mouth every 8 (eight) hours as needed.

## 2017-09-10 ENCOUNTER — Telehealth: Payer: Self-pay | Admitting: Internal Medicine

## 2017-09-10 NOTE — Telephone Encounter (Signed)
Forms were just received today and given to PCP. Once completed they will be faxed back with all of that information on them.

## 2017-09-10 NOTE — Telephone Encounter (Signed)
Copied from CRM [id number]. Topic: General - Other >> Sep 10, 2017  2:26 PM Leafy Ro wrote: Reason for CRM: Trinna Post is calling from [employer] absent management alex needs patient out of work dates, and medical diagnosis, office visits date and treatment plan and other supporting details.

## 2017-09-16 ENCOUNTER — Ambulatory Visit (INDEPENDENT_AMBULATORY_CARE_PROVIDER_SITE_OTHER)
Admission: RE | Admit: 2017-09-16 | Discharge: 2017-09-16 | Disposition: A | Discharge status: Home / Self Care | Payer: BLUE CROSS/BLUE SHIELD | Source: Ambulatory Visit | Attending: Family | Admitting: Family

## 2017-09-16 DIAGNOSIS — M5441 Lumbago with sciatica, right side: Secondary | ICD-10-CM | POA: Diagnosis not present

## 2017-09-16 DIAGNOSIS — M47816 Spondylosis without myelopathy or radiculopathy, lumbar region: Secondary | ICD-10-CM | POA: Diagnosis not present

## 2017-09-23 ENCOUNTER — Telehealth: Payer: Self-pay | Admitting: Family

## 2017-09-23 NOTE — Telephone Encounter (Signed)
I have received FMLA for patient via fax. Andrea [name] has wrote patient out of work May 17 to May 23 with a return to work date on May 24.   Forms have been completed & placed in [name]'s box to review and sign.

## 2017-09-30 DIAGNOSIS — Z0279 Encounter for issue of other medical certificate: Secondary | ICD-10-CM

## 2017-09-30 NOTE — Telephone Encounter (Signed)
These forms have been signed, faxed, sent to scan & charged for.

## 2018-05-14 ENCOUNTER — Other Ambulatory Visit: Payer: Self-pay | Admitting: Internal Medicine

## 2018-05-14 MED ORDER — TRIAMTERENE-HCTZ 37.5-25 MG PO TABS: 1.0000 | ORAL_TABLET | Freq: Every day | ORAL | 0 refills | Status: DC

## 2018-05-14 MED ORDER — ATORVASTATIN CALCIUM 10 MG PO TABS: 10.0000 mg | ORAL_TABLET | Freq: Every day | ORAL | 0 refills | Status: DC

## 2018-05-14 MED ORDER — FLUTICASONE-SALMETEROL 250-50 MCG/DOSE IN AEPB: 1.0000 | INHALATION_SPRAY | Freq: Two times a day (BID) | RESPIRATORY_TRACT | 0 refills | Status: DC | PRN

## 2018-05-14 NOTE — Telephone Encounter (Signed)
Requested medication (s) are due for refill today: yes  Requested medication (s) are on the active medication list: yes  Last refill:  02/26/17 expired  Future visit scheduled: yes  Notes to clinic:  Expired RX    Requested Prescriptions  Pending Prescriptions Disp Refills   Fluticasone-Salmeterol (ADVAIR) 250-50 MCG/DOSE AEPB 180 each 3    Sig: Inhale 1 puff into the lungs 2 (two) times daily as needed (COPD).     Pulmonology:  Combination Products Passed - 05/14/2018  1:36 PM      Passed - Valid encounter within last 12 months    Recent Outpatient Visits          8 months ago Acute right-sided low back pain with right-sided sciatica   [facility] -Shanna Cisco[facility] Murray, Allyne GeeLaura Woodruff, FNP   1 year ago Preventative health care   [facility] -Clair Gulling[facility] John, James W, MD   1 year ago Essential hypertension   [facility] -Clair GullingE[facility] John, James W, MD   1 year ago Acute right-sided low back pain without sciatica   [facility] -Clair GullingE[facility] John, James W, MD   2 years ago Preventative health care   [facility] -[facility] John, James W, MD      Future Appointments            In 1 month Corwin LevinsJohn, James W, MD [facility]          atorvastatin (LIPITOR) 10 MG tablet 90 tablet 3    Sig: Take 1 tablet (10 mg total) by mouth at bedtime.     Cardiovascular:  Antilipid - Statins Failed - 05/14/2018  1:36 PM      Failed - Total Cholesterol in normal range and within 360 days    Cholesterol  Date Value Ref Range Status  04/09/2017 161 0 - 200 mg/dL Final    Comment:    ATP III Classification       Desirable:  < 200 mg/dL               Borderline High:  200 - 239 mg/dL          High:  > = 960240 mg/dL         Failed - LDL in normal range and within 360 days    LDL Cholesterol  Date Value Ref Range Status  04/09/2017 53 0 - 99 mg/dL Final         Failed - HDL in normal range and within  360 days    HDL  Date Value Ref Range Status  04/09/2017 79.60 >39.00 mg/dL Final         Failed - Triglycerides in normal range and within 360 days    Triglycerides  Date Value Ref Range Status  04/09/2017 145.0 0.0 - 149.0 mg/dL Final    Comment:    Normal:  <150 mg/dLBorderline High:  150 - 199 mg/dL         Passed - Patient is not pregnant      Passed - Valid encounter within last 12 months    Recent Outpatient Visits          8 months ago Acute right-sided low back pain with right-sided sciatica   [facility] Murray, Allyne GeeLaura Woodruff, FNP   1 year ago Preventative health care   [facility] -Clair Gull[facility] John, James W, MD   1 year ago Essential  hypertension   [facility] -Clair Gulling, MD   1 year ago Acute right-sided low back pain without sciatica   [facility] -Clair Gulling, MD   2 years ago Preventative health care   [facility] -Clair Gulling, MD      Future Appointments            In 1 month Corwin Levins, MD [facility] -[facility], [facility]          triamterene-hydrochlorothiazide (MAXZIDE-25) 37.5-25 MG tablet 90 tablet 3    Sig: Take 1 tablet by mouth daily.     Cardiovascular: Diuretic Combos Failed - 05/14/2018  1:36 PM      Failed - K in normal range and within 360 days    Potassium  Date Value Ref Range Status  04/09/2017 3.8 3.5 - 5.1 mEq/L Final         Failed - Na in normal range and within 360 days    Sodium  Date Value Ref Range Status  04/09/2017 141 135 - 145 mEq/L Final         Failed - Cr in normal range and within 360 days    Creatinine, Ser  Date Value Ref Range Status  04/09/2017 1.18 0.40 - 1.20 mg/dL Final         Failed - Ca in normal range and within 360 days    Calcium  Date Value Ref Range Status  04/09/2017 9.4 8.4 - 10.5 mg/dL Final         Failed - Valid encounter within last 6 months     Recent Outpatient Visits          8 months ago Acute right-sided low back pain with right-sided sciatica   [facility] -Shanna Cisco, Allyne Gee, FNP   1 year ago Preventative health care   [facility] -Clair Gulling, MD   1 year ago Essential hypertension   [facility] -Clair Gulling, MD   1 year ago Acute right-sided low back pain without sciatica   [facility] -Clair Gulling, MD   2 years ago Preventative health care   [facility] -Clair Gulling, MD      Future Appointments            In 1 month Corwin Levins, MD [facility], [facility]           Passed - Last BP in normal range    BP Readings from Last 1 Encounters:  09/09/17 120/82

## 2018-05-14 NOTE — Telephone Encounter (Signed)
Pt has made cpx in march. Per office policy sent 30 day to local pharmacy until appt...Raechel Chute/lmb

## 2018-05-14 NOTE — Telephone Encounter (Signed)
Copied from CRM [id number]. Topic: Quick Communication - Rx Refill/Question >> May 14, 2018  1:12 PM Gloriann Loan L wrote: Medication: atorvastatin (LIPITOR) 10 MG tablet     Fluticasone-Salmeterol (ADVAIR) 250-50 MCG/DOSE AEPB    triamterene-hydrochlorothiazide (MAXZIDE-25) 37.5-25 MG tablet     Has the patient contacted their pharmacy? No.pt isn't using the mail service anymore (Agent: If no, request that the patient contact the pharmacy for the refill.) (Agent: If yes, when and what did the pharmacy advise?)  Preferred Pharmacy (with phone number or street name): [facility] [phone] [place], [place] - [address] [phone] (Phone) [phone] (Fax)    Agent: Please be advised that RX refills may take up to 3 business days. We ask that you follow-up with your pharmacy.

## 2018-05-15 ENCOUNTER — Emergency Department (HOSPITAL_COMMUNITY)
Admission: EM | Admit: 2018-05-15 | Discharge: 2018-05-15 | Disposition: A | Discharge status: Home / Self Care | Payer: BLUE CROSS/BLUE SHIELD | Attending: Emergency Medicine | Admitting: Emergency Medicine

## 2018-05-15 ENCOUNTER — Encounter (HOSPITAL_COMMUNITY): Payer: Self-pay | Admitting: Emergency Medicine

## 2018-05-15 ENCOUNTER — Emergency Department (HOSPITAL_COMMUNITY): Payer: BLUE CROSS/BLUE SHIELD

## 2018-05-15 ENCOUNTER — Other Ambulatory Visit: Payer: Self-pay

## 2018-05-15 DIAGNOSIS — S0101XA Laceration without foreign body of scalp, initial encounter: Secondary | ICD-10-CM

## 2018-05-15 DIAGNOSIS — Y929 Unspecified place or not applicable: Secondary | ICD-10-CM | POA: Insufficient documentation

## 2018-05-15 DIAGNOSIS — T17920A Food in respiratory tract, part unspecified causing asphyxiation, initial encounter: Secondary | ICD-10-CM | POA: Diagnosis not present

## 2018-05-15 DIAGNOSIS — W19XXXA Unspecified fall, initial encounter: Secondary | ICD-10-CM | POA: Diagnosis not present

## 2018-05-15 DIAGNOSIS — Z7982 Long term (current) use of aspirin: Secondary | ICD-10-CM | POA: Diagnosis not present

## 2018-05-15 DIAGNOSIS — R58 Hemorrhage, not elsewhere classified: Secondary | ICD-10-CM | POA: Diagnosis not present

## 2018-05-15 DIAGNOSIS — I10 Essential (primary) hypertension: Secondary | ICD-10-CM | POA: Diagnosis not present

## 2018-05-15 DIAGNOSIS — Y939 Activity, unspecified: Secondary | ICD-10-CM | POA: Diagnosis not present

## 2018-05-15 DIAGNOSIS — J449 Chronic obstructive pulmonary disease, unspecified: Secondary | ICD-10-CM | POA: Diagnosis not present

## 2018-05-15 DIAGNOSIS — S0003XA Contusion of scalp, initial encounter: Secondary | ICD-10-CM | POA: Diagnosis not present

## 2018-05-15 DIAGNOSIS — F10929 Alcohol use, unspecified with intoxication, unspecified: Secondary | ICD-10-CM | POA: Diagnosis not present

## 2018-05-15 DIAGNOSIS — Z87891 Personal history of nicotine dependence: Secondary | ICD-10-CM | POA: Diagnosis not present

## 2018-05-15 DIAGNOSIS — Y999 Unspecified external cause status: Secondary | ICD-10-CM | POA: Insufficient documentation

## 2018-05-15 DIAGNOSIS — Z23 Encounter for immunization: Secondary | ICD-10-CM | POA: Insufficient documentation

## 2018-05-15 DIAGNOSIS — Z79899 Other long term (current) drug therapy: Secondary | ICD-10-CM | POA: Diagnosis not present

## 2018-05-15 DIAGNOSIS — S0990XA Unspecified injury of head, initial encounter: Secondary | ICD-10-CM | POA: Diagnosis not present

## 2018-05-15 MED ORDER — TETANUS-DIPHTH-ACELL PERTUSSIS 5-2.5-18.5 LF-MCG/0.5 IM SUSP
0.5000 mL | Freq: Once | INTRAMUSCULAR | Status: AC
  Administered 2018-05-15: 0.5 mL via INTRAMUSCULAR
  Filled 2018-05-15: qty 0.5

## 2018-05-15 NOTE — ED Triage Notes (Signed)
Pt BIB [facility] for a fall with ETOH on board. EMS reports the pt was CAO and sitting in a chair. EMS advised the pt did not tolerate a c-collar and did not complain of any neck pain. EMS reports no LOC. EMS reports the pt is very cooperative with vital signs stable. EMS reports a 1-2cm laceration to the posterior right side of her head.   Pt states she was drinking [employer] until 0200 this date and went to bed. Pt states she got up to use the bathroom and fell. Pt denies losing consciousness and denies any pain. Pt unsure of where her laceration is located.

## 2018-05-15 NOTE — Discharge Instructions (Signed)
Make sure not to get your wound wet for the first 24 hours.  After that, you can wash the area gently with water, but do not submerge.  Please return the emergency department develop any increasing pain, redness, swelling, drainage around the wound.

## 2018-05-15 NOTE — ED Provider Notes (Signed)
[facility] Provider Note   CSN: [id number] Arrival date & time: 05/15/18  0542     History   Chief Complaint Chief Complaint  Patient presents with  . Fall  . Alcohol Intoxication    HPI Ted McalpineRhonda J Callicott is a 68 y.o. female with history of hypertension, COPD who presents with head laceration after falling.  Patient was drinking alcohol before bed and got up to go the bathroom and hit her head on a desk.  She did not feel dizzy or lightheaded prior to fall, but states she was still little drunk.  She did not lose consciousness.  She denies any vomiting or nausea.  She denies any pain at all, especially in her neck or back.  Her tetanus is not up-to-date.  She denies any other injuries.  HPI  Past Medical History:  Diagnosis Date  . Allergic rhinitis, cause unspecified 08/07/2012  . COPD 03/23/2008  . Cough 11/05/2007  . HYPERTENSION 02/10/2007  . MUSCLE CRAMPS 08/09/2009  . OSTEOPOROSIS 02/10/2007  . Other and unspecified hyperlipidemia 08/07/2012  . PEPTIC ULCER DISEASE 03/23/2008  . PNEUMONIA, RIGHT UPPER LOBE 03/23/2008  . RESTLESS LEG SYNDROME 03/23/2008  . Unspecified disorder of thyroid 11/16/2007    Patient Active Problem List   Diagnosis Date Noted  . Anxiety and depression 02/26/2017  . Low back pain 11/27/2016  . AKI (acute kidney injury) (HCC) 02/23/2016  . Osteoarthritis, hand 01/18/2015  . Bilateral knee pain 01/18/2015  . Rash and nonspecific skin eruption 12/23/2014  . TIA (transient ischemic attack) 02/11/2014  . Allergic rhinitis 08/07/2012  . Thrush 08/07/2012  . Hyperlipidemia 08/07/2012  . Preventative health care 12/09/2010  . MUSCLE CRAMPS 08/09/2009  . RESTLESS LEG SYNDROME 03/23/2008  . PNEUMONIA, RIGHT UPPER LOBE 03/23/2008  . COPD (chronic obstructive pulmonary disease) (HCC) 03/23/2008  . Peptic ulcer 03/23/2008  . Unspecified disorder of thyroid 11/16/2007  . Essential hypertension 02/10/2007  .  OSTEOPOROSIS 02/10/2007    Past Surgical History:  Procedure Laterality Date  . Breast Biopsy negative  1972     OB History   No obstetric history on file.      Home Medications    Prior to Admission medications   Medication Sig Start Date End Date Taking? Authorizing Provider  ALPRAZolam (XANAX) 0.25 MG tablet Take 1 tablet (0.25 mg total) by mouth at bedtime as needed for anxiety. 11/28/16   Corwin LevinsJohn, James W, MD  aspirin EC 81 MG tablet Take 1 tablet (81 mg total) by mouth daily. 02/12/14   Rai, Delene Ruffiniipudeep K, MD  atorvastatin (LIPITOR) 10 MG tablet Take 1 tablet (10 mg total) by mouth at bedtime. Must keep schedule appt in March for future refills 05/14/18   Corwin LevinsJohn, James W, MD  escitalopram (LEXAPRO) 10 MG tablet TAKE 1 TABLET (10 MG TOTAL) DAILY BY MOUTH. 03/25/17 06/23/17  Corwin LevinsJohn, James W, MD  Fluticasone-Salmeterol (ADVAIR) 250-50 MCG/DOSE AEPB Inhale 1 puff into the lungs 2 (two) times daily as needed (COPD). Must keep schedule appt in March for future refills 05/14/18   Corwin LevinsJohn, James W, MD  methocarbamol (ROBAXIN) 500 MG tablet Take 1 tablet (500 mg total) by mouth every 8 (eight) hours as needed. 09/09/17   Olive BassMurray, Laura Woodruff, FNP  naproxen (NAPROSYN) 500 MG tablet Take 1 tablet (500 mg total) by mouth 2 (two) times daily. 11/27/16   Corwin LevinsJohn, James W, MD  potassium chloride (K-DUR) 10 MEQ tablet Take 1 tablet (10 mEq total) daily by mouth. 02/26/17  Corwin Levins, MD  predniSONE (DELTASONE) 20 MG tablet Take 2 tablets (40 mg total) by mouth daily with breakfast. 09/09/17   Olive Bass, FNP  traMADol (ULTRAM) 50 MG tablet Take 1 tablet (50 mg total) by mouth every 8 (eight) hours as needed. 09/09/17   Olive Bass, FNP  triamterene-hydrochlorothiazide (MAXZIDE-25) 37.5-25 MG tablet Take 1 tablet by mouth daily. Must keep schedule appt in March for future refills 05/14/18   Corwin Levins, MD    Family History Family History  Problem Relation Age of Onset  . Leukemia Father      Social History Social History   Tobacco Use  . Smoking status: Former Games developer  . Smokeless tobacco: Never Used  . Tobacco comment: since 2009  Substance Use Topics  . Alcohol use: Yes  . Drug use: No     Allergies   Alendronate sodium; Zyrtec [cetirizine]; and Sertraline hcl   Review of Systems Review of Systems  Constitutional: Negative for fever.  Musculoskeletal: Negative for neck pain.  Skin: Positive for wound.  Neurological: Negative for dizziness, light-headedness and numbness.     Physical Exam Updated Vital Signs BP (!) 142/94   Pulse 97   Temp 98.1 F (36.7 C) (Oral)   Resp 16   Ht 5\' 6"  (1.676 m)   Wt 49.9 kg   LMP  (Exact Date)   SpO2 98%   BMI 17.75 kg/m   Physical Exam Vitals signs and nursing note reviewed.  Constitutional:      General: She is not in acute distress.    Appearance: She is well-developed. She is not diaphoretic.  HENT:     Head: Normocephalic and atraumatic.      Mouth/Throat:     Pharynx: No oropharyngeal exudate.  Eyes:     General: No scleral icterus.       Right eye: No discharge.        Left eye: No discharge.     Conjunctiva/sclera: Conjunctivae normal.     Pupils: Pupils are equal, round, and reactive to light.  Neck:     Musculoskeletal: Normal range of motion and neck supple.     Thyroid: No thyromegaly.  Cardiovascular:     Rate and Rhythm: Normal rate and regular rhythm.     Heart sounds: Normal heart sounds. No murmur. No friction rub. No gallop.   Pulmonary:     Effort: Pulmonary effort is normal. No respiratory distress.     Breath sounds: Normal breath sounds. No stridor. No wheezing or rales.  Abdominal:     General: Bowel sounds are normal. There is no distension.     Palpations: Abdomen is soft.     Tenderness: There is no abdominal tenderness. There is no guarding or rebound.  Lymphadenopathy:     Cervical: No cervical adenopathy.  Skin:    General: Skin is warm and dry.     Coloration:  Skin is not pale.     Findings: No rash.  Neurological:     Mental Status: She is alert.     Coordination: Coordination normal.     Comments: CN 3-12 intact; normal sensation throughout; 5/5 strength in all 4 extremities; equal bilateral grip strength      ED Treatments / Results  Labs (all labs ordered are listed, but only abnormal results are displayed) Labs Reviewed - No data to display  EKG None  Radiology Ct Head Wo Contrast  Result Date: 05/15/2018 CLINICAL DATA:  Pain following  fall EXAM: CT HEAD WITHOUT CONTRAST TECHNIQUE: Contiguous axial images were obtained from the base of the skull through the vertex without intravenous contrast. COMPARISON:  Head CT February 11, 2014 and brain MRI February 12, 2014 FINDINGS: Brain: There is age related volume loss. There is no intracranial mass, hemorrhage, extra-axial fluid collection, or midline shift. There is patchy small vessel disease in the centra semiovale bilaterally. No acute appearing infarct is evident on this study. Vascular: There is no hyperdense vessel. There is calcification in each carotid siphon region. Skull: The bony calvarium appears intact. There is a right parietal scalp hematoma. Sinuses/Orbits: There is mucosal thickening in several ethmoid air cells. Other visualized paranasal sinuses are clear. Frontal sinuses are hypoplastic. Visualized orbits appear symmetric bilaterally. Other: Mastoid air cells are clear. IMPRESSION: Age related volume loss with patchy supratentorial small vessel disease. No acute infarct evident. No mass or hemorrhage. Right parietal scalp hematoma.  No fracture evident. There are foci of arterial vascular calcification. There is mucosal thickening in several ethmoid air cells. Electronically Signed   By: Bretta Bang III M.D.   On: 05/15/2018 07:05    Procedures .Marland KitchenLaceration Repair Date/Time: 05/15/2018 8:47 AM Performed by: Emi Holes, PA-C Authorized by: Emi Holes, PA-C    Consent:    Consent obtained:  Verbal   Consent given by:  Patient   Risks discussed:  Infection, pain and poor cosmetic result   Alternatives discussed:  No treatment Anesthesia (see MAR for exact dosages):    Anesthesia method:  None Laceration details:    Location:  Scalp   Scalp location:  R parietal   Length (cm):  0.2 Repair type:    Repair type:  Simple Pre-procedure details:    Preparation:  Patient was prepped and draped in usual sterile fashion and imaging obtained to evaluate for foreign bodies Exploration:    Hemostasis achieved with:  Direct pressure   Wound exploration: wound explored through full range of motion     Wound extent: no foreign bodies/material noted and no muscle damage noted     Contaminated: no   Treatment:    Area cleansed with:  Saline and Shur-Clens   Amount of cleaning:  Standard   Irrigation solution:  Sterile saline   Irrigation volume:  100   Visualized foreign bodies/material removed: no   Skin repair:    Repair method:  Tissue adhesive Approximation:    Approximation:  Close Post-procedure details:    Dressing:  Open (no dressing)   Patient tolerance of procedure:  Tolerated well, no immediate complications   (including critical care time)  Medications Ordered in ED Medications  Tdap (BOOSTRIX) injection 0.5 mL (0.5 mLs Intramuscular Given 05/15/18 0650)     Initial Impression / Assessment and Plan / ED Course  I have reviewed the triage vital signs and the nursing notes.  Pertinent labs & imaging results that were available during my care of the patient were reviewed by me and considered in my medical decision making (see chart for details).     Patient presenting with right parietal hematoma with minimal oozing that was continued to ooze throughout ED course.  Wound was repaired with Dermabond successfully.  CT is negative for intracranial injury.  Patient has no symptoms.  She is neurovascularly intact.  Suspect fall  related to alcohol on board.  Patient is sober now.  Care discussed regarding Dermabond.  Return precautions discussed.  Patient understands and agrees with plan.  Patient vitals stable throughout  ED course and discharged in satisfactory condition.  Final Clinical Impressions(s) / ED Diagnoses   Final diagnoses:  Fall, initial encounter  Laceration of scalp without foreign body, initial encounter    ED Discharge Orders    None       Emi Holes, PA-C 05/15/18 1610    Derwood Kaplan, MD 05/15/18 [phone]

## 2018-06-01 ENCOUNTER — Other Ambulatory Visit: Payer: Self-pay | Admitting: Internal Medicine

## 2018-06-01 DIAGNOSIS — Z1231 Encounter for screening mammogram for malignant neoplasm of breast: Secondary | ICD-10-CM

## 2018-06-25 ENCOUNTER — Ambulatory Visit (INDEPENDENT_AMBULATORY_CARE_PROVIDER_SITE_OTHER): Payer: BLUE CROSS/BLUE SHIELD | Admitting: Internal Medicine

## 2018-06-25 ENCOUNTER — Other Ambulatory Visit (INDEPENDENT_AMBULATORY_CARE_PROVIDER_SITE_OTHER): Payer: BLUE CROSS/BLUE SHIELD

## 2018-06-25 ENCOUNTER — Encounter: Payer: Self-pay | Admitting: Internal Medicine

## 2018-06-25 VITALS — BP 134/86 | HR 91 | Temp 98.2°F | Ht 66.0 in | Wt 105.0 lb

## 2018-06-25 DIAGNOSIS — I10 Essential (primary) hypertension: Secondary | ICD-10-CM

## 2018-06-25 DIAGNOSIS — M545 Low back pain, unspecified: Secondary | ICD-10-CM

## 2018-06-25 DIAGNOSIS — Z0000 Encounter for general adult medical examination without abnormal findings: Secondary | ICD-10-CM

## 2018-06-25 DIAGNOSIS — Z23 Encounter for immunization: Secondary | ICD-10-CM | POA: Diagnosis not present

## 2018-06-25 LAB — HEPATIC FUNCTION PANEL
ALT: 15 U/L (ref 0–35)
AST: 30 U/L (ref 0–37)
Albumin: 4.6 g/dL (ref 3.5–5.2)
Alkaline Phosphatase: 88 U/L (ref 39–117)
BILIRUBIN TOTAL: 1.3 mg/dL — AB (ref 0.2–1.2)
Bilirubin, Direct: 0.2 mg/dL (ref 0.0–0.3)
Total Protein: 8.2 g/dL (ref 6.0–8.3)

## 2018-06-25 LAB — LIPID PANEL
Cholesterol: 192 mg/dL (ref 0–200)
HDL: 81.3 mg/dL (ref >39.00)
LDL CALC: 81 mg/dL (ref 0–99)
NonHDL: 110.79
Total CHOL/HDL Ratio: 2
Triglycerides: 148 mg/dL (ref 0.0–149.0)
VLDL: 29.6 mg/dL (ref 0.0–40.0)

## 2018-06-25 LAB — CBC WITH DIFFERENTIAL/PLATELET
Basophils Absolute: 0 10*3/uL (ref 0.0–0.1)
Basophils Relative: 0.7 % (ref 0.0–3.0)
Eosinophils Absolute: 0.1 10*3/uL (ref 0.0–0.7)
Eosinophils Relative: 2.6 % (ref 0.0–5.0)
HCT: 41.9 % (ref 36.0–46.0)
Hemoglobin: 14.3 g/dL (ref 12.0–15.0)
Lymphocytes Relative: 16.4 % (ref 12.0–46.0)
Lymphs Abs: 0.9 10*3/uL (ref 0.7–4.0)
MCHC: 34.1 g/dL (ref 30.0–36.0)
MCV: 99.3 fl (ref 78.0–100.0)
Monocytes Absolute: 0.5 10*3/uL (ref 0.1–1.0)
Monocytes Relative: 9.5 % (ref 3.0–12.0)
NEUTROS ABS: 3.9 10*3/uL (ref 1.4–7.7)
Neutrophils Relative %: 70.8 % (ref 43.0–77.0)
Platelets: 300 10*3/uL (ref 150.0–400.0)
RBC: 4.22 Mil/uL (ref 3.87–5.11)
RDW: 13.7 % (ref 11.5–15.5)
WBC: 5.5 10*3/uL (ref 4.0–10.5)

## 2018-06-25 LAB — BASIC METABOLIC PANEL
BUN: 39 mg/dL — ABNORMAL HIGH (ref 6–23)
CHLORIDE: 101 meq/L (ref 96–112)
CO2: 27 mEq/L (ref 19–32)
Calcium: 9.8 mg/dL (ref 8.4–10.5)
Creatinine, Ser: 1.21 mg/dL — ABNORMAL HIGH (ref 0.40–1.20)
GFR: 53.73 mL/min — ABNORMAL LOW (ref >60.00)
Glucose, Bld: 95 mg/dL (ref 70–99)
Potassium: 3.9 mEq/L (ref 3.5–5.1)
Sodium: 138 mEq/L (ref 135–145)

## 2018-06-25 LAB — TSH: TSH: 0.78 u[IU]/mL (ref 0.35–4.50)

## 2018-06-25 LAB — URINALYSIS, ROUTINE W REFLEX MICROSCOPIC
Bilirubin Urine: NEGATIVE
KETONES UR: NEGATIVE
Nitrite: NEGATIVE
Specific Gravity, Urine: 1.02 (ref 1.000–1.030)
Total Protein, Urine: NEGATIVE
Urine Glucose: NEGATIVE
Urobilinogen, UA: 0.2 (ref 0.0–1.0)
pH: 6 (ref 5.0–8.0)

## 2018-06-25 MED ORDER — ATORVASTATIN CALCIUM 10 MG PO TABS: 10.0000 mg | ORAL_TABLET | Freq: Every day | ORAL | 3 refills | Status: DC

## 2018-06-25 MED ORDER — DIAZEPAM 5 MG PO TABS: ORAL_TABLET | ORAL | 1 refills | Status: DC

## 2018-06-25 MED ORDER — ESCITALOPRAM OXALATE 10 MG PO TABS: 10.0000 mg | ORAL_TABLET | Freq: Every day | ORAL | 3 refills | Status: DC

## 2018-06-25 MED ORDER — FLUTICASONE-SALMETEROL 250-50 MCG/DOSE IN AEPB: 1.0000 | INHALATION_SPRAY | Freq: Two times a day (BID) | RESPIRATORY_TRACT | 3 refills | Status: DC | PRN

## 2018-06-25 NOTE — Progress Notes (Signed)
Subjective:    Patient ID: Andrea [name], female    DOB: 02-Apr-1951, 68 y.o.   MRN: [id number]  HPI  .Here for wellness and f/u;  Overall doing ok;  Pt denies Chest pain, worsening SOB, DOE, wheezing, orthopnea, PND, worsening LE edema, palpitations, dizziness or syncope.  Pt denies neurological change such as new headache, facial or extremity weakness.  Pt denies polydipsia, polyuria, or low sugar symptoms. Pt states overall good compliance with treatment and medications, good tolerability, and has been trying to follow appropriate diet.  Pt denies worsening depressive symptoms, suicidal ideation or panic. No fever, night sweats, wt loss, loss of appetite, or other constitutional symptoms.  Pt states good ability with ADL's, has low fall risk, home safety reviewed and adequate, no other significant changes in hearing or vision, and only occasionally active with exercise.  Has also early satiety at times, ? may be stress,realted/ thinking about retirement next yr.    Also Pt continues to have recurring right LBP without change in severity, bowel or bladder change, fever, wt loss,  worsening LE pain/numbness/weakness, gait change or falls, except for left or right at times pain and tingling intermittent mild.   Past Medical History:  Diagnosis Date  . Allergic rhinitis, cause unspecified 08/07/2012  . COPD 03/23/2008  . Cough 11/05/2007  . HYPERTENSION 02/10/2007  . MUSCLE CRAMPS 08/09/2009  . OSTEOPOROSIS 02/10/2007  . Other and unspecified hyperlipidemia 08/07/2012  . PEPTIC ULCER DISEASE 03/23/2008  . PNEUMONIA, RIGHT UPPER LOBE 03/23/2008  . RESTLESS LEG SYNDROME 03/23/2008  . Unspecified disorder of thyroid 11/16/2007   Past Surgical History:  Procedure Laterality Date  . Breast Biopsy negative  1972    reports that she has quit smoking. She has never used smokeless tobacco. She reports current alcohol use. She reports that she does not use drugs. family history includes Leukemia in her  father. Allergies  Allergen Reactions  . Alendronate Sodium Nausea Only    GI upset  . Zyrtec [Cetirizine] Itching, Rash and Other (See Comments)    Tongue burning, thrush  . Sertraline Hcl Other (See Comments)    unknown   Current Outpatient Medications on File Prior to Visit  Medication Sig Dispense Refill  . ALPRAZolam (XANAX) 0.25 MG tablet Take 1 tablet (0.25 mg total) by mouth at bedtime as needed for anxiety. 30 tablet 2  . aspirin EC 81 MG tablet Take 1 tablet (81 mg total) by mouth daily. 30 tablet 5  . methocarbamol (ROBAXIN) 500 MG tablet Take 1 tablet (500 mg total) by mouth every 8 (eight) hours as needed. 30 tablet 0  . naproxen (NAPROSYN) 500 MG tablet Take 1 tablet (500 mg total) by mouth 2 (two) times daily. 60 tablet 1  . potassium chloride (K-DUR) 10 MEQ tablet Take 1 tablet (10 mEq total) daily by mouth. 90 tablet 3  . predniSONE (DELTASONE) 20 MG tablet Take 2 tablets (40 mg total) by mouth daily with breakfast. 10 tablet 0  . traMADol (ULTRAM) 50 MG tablet Take 1 tablet (50 mg total) by mouth every 8 (eight) hours as needed. 20 tablet 0  . triamterene-hydrochlorothiazide (MAXZIDE-25) 37.5-25 MG tablet Take 1 tablet by mouth daily. Must keep schedule appt in March for future refills 90 tablet 0   No current facility-administered medications on file prior to visit.    Review of Systems Constitutional: Negative for other unusual diaphoresis, sweats, appetite or weight changes HENT: Negative for other worsening hearing loss, ear pain, facial swelling,  mouth sores or neck stiffness.   Eyes: Negative for other worsening pain, redness or other visual disturbance.  Respiratory: Negative for other stridor or swelling Cardiovascular: Negative for other palpitations or other chest pain  Gastrointestinal: Negative for worsening diarrhea or loose stools, blood in stool, distention or other pain Genitourinary: Negative for hematuria, flank pain or other change in urine volume.    Musculoskeletal: Negative for myalgias or other joint swelling.  Skin: Negative for other color change, or other wound or worsening drainage.  Neurological: Negative for other syncope or numbness. Hematological: Negative for other adenopathy or swelling Psychiatric/Behavioral: Negative for hallucinations, other worsening agitation, SI, self-injury, or new decreased concentration All other system neg per pt    Objective:   Physical Exam BP 134/86   Pulse 91   Temp 98.2 F (36.8 C) (Oral)   Ht 5\' 6"  (1.676 m)   Wt 105 lb (47.6 kg)   SpO2 95%   BMI 16.95 kg/m  VS noted,  Constitutional: Pt is oriented to person, place, and time. Appears well-developed and well-nourished, in no significant distress and comfortable Head: Normocephalic and atraumatic  Eyes: Conjunctivae and EOM are normal. Pupils are equal, round, and reactive to light Right Ear: External ear normal without discharge Left Ear: External ear normal without discharge Nose: Nose without discharge or deformity Mouth/Throat: Oropharynx is without other ulcerations and moist  Neck: Normal range of motion. Neck supple. No JVD present. No tracheal deviation present or significant neck LA or mass Cardiovascular: Normal rate, regular rhythm, normal heart sounds and intact distal pulses.   Pulmonary/Chest: WOB normal and breath sounds without rales or wheezing  Abdominal: Soft. Bowel sounds are normal. NT. No HSM  Musculoskeletal: Normal range of motion. Exhibits no edema Lymphadenopathy: Has no other cervical adenopathy.  Neurological: Pt is alert and oriented to person, place, and time. Pt has normal reflexes. No cranial nerve deficit. Motor grossly intact, Gait intact Skin: Skin is warm and dry. No rash noted or new ulcerations Psychiatric:  Has normal mood and affect. Behavior is normal without agitation No other exam findings Lab Results  Component Value Date   WBC 5.5 06/25/2018   HGB 14.3 06/25/2018   HCT 41.9  06/25/2018   PLT 300.0 06/25/2018   GLUCOSE 95 06/25/2018   CHOL 192 06/25/2018   TRIG 148.0 06/25/2018   HDL 81.30 06/25/2018   LDLDIRECT 111.6 12/14/2012   LDLCALC 81 06/25/2018   ALT 15 06/25/2018   AST 30 06/25/2018   NA 138 06/25/2018   K 3.9 06/25/2018   CL 101 06/25/2018   CREATININE 1.21 (H) 06/25/2018   BUN 39 (H) 06/25/2018   CO2 27 06/25/2018   TSH 0.78 06/25/2018   INR 1.00 02/11/2014   HGBA1C 5.7 (H) 02/12/2014       Assessment & Plan:

## 2018-06-25 NOTE — Assessment & Plan Note (Signed)

## 2018-06-25 NOTE — Assessment & Plan Note (Signed)
stable overall by history and exam, recent data reviewed with pt, and pt to continue medical treatment as before,  to f/u any worsening symptoms or concerns  

## 2018-06-25 NOTE — Patient Instructions (Addendum)
You had the Pneumovax pneumonia shot today  You will be contacted regarding the referral for: MRI  (and to take the valium 5 mg x 1 pill about 30 min prior to the procedure)  Please continue all other medications as before, and refills have been done if requested.  Please have the pharmacy call with any other refills you may need.  Please continue your efforts at being more active, low cholesterol diet, and weight control.  You are otherwise up to date with prevention measures today.  Please keep your appointments with your specialists as you may have planned  Please go to the LAB in the Basement (turn left off the elevator) for the tests to be done today  You will be contacted by phone if any changes need to be made immediately.  Otherwise, you will receive a letter about your results with an explanation, but please check with MyChart first.  Please remember to sign up for MyChart if you have not done so, as this will be important to you in the future with finding out test results, communicating by private email, and scheduling acute appointments online when needed.  Please return in 1 year for your yearly visit, or sooner if needed, with Lab testing done 3-5 days before

## 2018-06-25 NOTE — Assessment & Plan Note (Signed)
For MRI LS spine with valium 5 mg prior

## 2018-07-08 ENCOUNTER — Other Ambulatory Visit: Payer: Self-pay

## 2018-07-08 ENCOUNTER — Ambulatory Visit
Admission: RE | Admit: 2018-07-08 | Discharge: 2018-07-08 | Disposition: A | Discharge status: Home / Self Care | Payer: BLUE CROSS/BLUE SHIELD | Source: Ambulatory Visit | Attending: Internal Medicine | Admitting: Internal Medicine

## 2018-07-08 DIAGNOSIS — Z1231 Encounter for screening mammogram for malignant neoplasm of breast: Secondary | ICD-10-CM | POA: Diagnosis not present

## 2018-07-16 ENCOUNTER — Telehealth: Payer: Self-pay

## 2018-07-16 NOTE — Telephone Encounter (Signed)
Copied from CRM [id number]. Topic: General - Inquiry >> Jul 16, 2018 11:07 AM Lorrine Kin, NT wrote: Reason for CRM: Patient calling and would like a call back from the nurse. State that she works at an [employer] that [employer]. States that she has COPD and would like to know if she should return to work due to worries about COVID 19. Please advise. Would like a call to discuss.

## 2018-07-16 NOTE — Telephone Encounter (Signed)
Sorry no, we would not be able to say that she is too high risk to work due to her condition, thanks

## 2018-07-17 NOTE — Telephone Encounter (Signed)
Pt has been informed and expressed understanding.  

## 2018-08-04 ENCOUNTER — Encounter: Payer: Self-pay | Admitting: Internal Medicine

## 2018-08-16 ENCOUNTER — Other Ambulatory Visit: Payer: Self-pay | Admitting: Internal Medicine

## 2018-09-18 ENCOUNTER — Telehealth: Payer: Self-pay | Admitting: Internal Medicine

## 2018-09-18 MED ORDER — ALPRAZOLAM 0.25 MG PO TABS: 0.2500 mg | ORAL_TABLET | Freq: Every evening | ORAL | 2 refills | Status: DC | PRN

## 2018-09-18 NOTE — Telephone Encounter (Signed)
Done erx 

## 2018-09-18 NOTE — Telephone Encounter (Signed)
Copied from CRM [id number]. Topic: Quick Communication - Rx Refill/Question >> Sep 18, 2018 12:57 PM Burchel, Abbi R wrote: Medication: ALPRAZolam (XANAX) 0.25 MG tablet  Pt states she takes this rx as needed and she has been experiencing increased anxiety and would like to have it refilled.  Please advise.   [phone]  Preferred Pharmacy: [id number] [place], [place] - [address]  [phone] (Phone) [phone] (Fax)    Pt was advised that RX refills may take up to 3 business days. We ask that you follow-up with your pharmacy.

## 2018-09-18 NOTE — Addendum Note (Signed)
Addended by: Corwin Levins on: 09/18/2018 01:40 PM   Modules accepted: Orders

## 2018-09-27 ENCOUNTER — Other Ambulatory Visit: Payer: Self-pay | Admitting: Internal Medicine

## 2018-11-17 ENCOUNTER — Other Ambulatory Visit (INDEPENDENT_AMBULATORY_CARE_PROVIDER_SITE_OTHER): Payer: BC Managed Care – PPO

## 2018-11-17 ENCOUNTER — Other Ambulatory Visit: Payer: Self-pay

## 2018-11-17 ENCOUNTER — Encounter: Payer: Self-pay | Admitting: Internal Medicine

## 2018-11-17 ENCOUNTER — Ambulatory Visit (INDEPENDENT_AMBULATORY_CARE_PROVIDER_SITE_OTHER): Payer: BC Managed Care – PPO | Admitting: Internal Medicine

## 2018-11-17 VITALS — BP 142/80 | HR 110 | Temp 98.6°F | Wt 105.8 lb

## 2018-11-17 DIAGNOSIS — J449 Chronic obstructive pulmonary disease, unspecified: Secondary | ICD-10-CM

## 2018-11-17 DIAGNOSIS — N183 Chronic kidney disease, stage 3 unspecified: Secondary | ICD-10-CM

## 2018-11-17 DIAGNOSIS — L259 Unspecified contact dermatitis, unspecified cause: Secondary | ICD-10-CM | POA: Diagnosis not present

## 2018-11-17 DIAGNOSIS — I10 Essential (primary) hypertension: Secondary | ICD-10-CM | POA: Diagnosis not present

## 2018-11-17 DIAGNOSIS — N189 Chronic kidney disease, unspecified: Secondary | ICD-10-CM | POA: Insufficient documentation

## 2018-11-17 LAB — BASIC METABOLIC PANEL
BUN: 25 mg/dL — ABNORMAL HIGH (ref 6–23)
CO2: 27 mEq/L (ref 19–32)
Calcium: 9.3 mg/dL (ref 8.4–10.5)
Chloride: 102 mEq/L (ref 96–112)
Creatinine, Ser: 1.37 mg/dL — ABNORMAL HIGH (ref 0.40–1.20)
GFR: 46.5 mL/min — ABNORMAL LOW (ref >60.00)
Glucose, Bld: 79 mg/dL (ref 70–99)
Potassium: 4 mEq/L (ref 3.5–5.1)
Sodium: 138 mEq/L (ref 135–145)

## 2018-11-17 MED ORDER — HYDROXYZINE HCL 10 MG PO TABS: 10.0000 mg | ORAL_TABLET | Freq: Three times a day (TID) | ORAL | 0 refills | Status: DC | PRN

## 2018-11-17 MED ORDER — PREDNISONE 10 MG PO TABS: ORAL_TABLET | ORAL | 0 refills | Status: DC

## 2018-11-17 MED ORDER — METHYLPREDNISOLONE ACETATE 80 MG/ML IJ SUSP
80.0000 mg | Freq: Once | INTRAMUSCULAR | Status: AC
  Administered 2018-11-17: 80 mg via INTRAMUSCULAR

## 2018-11-17 NOTE — Assessment & Plan Note (Signed)
stable overall by history and exam, recent data reviewed with pt, and pt to continue medical treatment as before,  to f/u any worsening symptoms or concerns  

## 2018-11-17 NOTE — Assessment & Plan Note (Signed)
Mild to mod, for depomedrol IM 80, predpac asd, hydroxyzine prn, topical benadryl prn,  to f/u any worsening symptoms or concerns

## 2018-11-17 NOTE — Patient Instructions (Signed)
You had the steroid shot today  Please take all new medication as prescribed - the prednisone and the hydroxyzine as needed for itching  OK to continue using the topical benadryl  Please continue all other medications as before, and refills have been done if requested.  Please have the pharmacy call with any other refills you may need.  Please continue your efforts at being more active, low cholesterol diet, and weight control.  Please keep your appointments with your specialists as you may have planned  Please go to the LAB in the Basement (turn left off the elevator) for the tests to be done today  You will be contacted by phone if any changes need to be made immediately.  Otherwise, you will receive a letter about your results with an explanation, but please check with MyChart first.  Please remember to sign up for MyChart if you have not done so, as this will be important to you in the future with finding out test results, communicating by private email, and scheduling acute appointments online when needed.

## 2018-11-17 NOTE — Assessment & Plan Note (Signed)
stable overall by history and exam, recent data reviewed with pt, and pt to continue medical treatment as before,  to f/u any worsening symptoms or concerns, for f/u lab 

## 2018-11-17 NOTE — Progress Notes (Signed)
Subjective:    Patient ID: Andrea [name], female    DOB: March 19, 1951, 68 y.o.   MRN: [id number]  HPI  Here 2 days after working in the yard clearing green things, now with rather dramatic diffuse red, itchy, swelling to entire face and forehead as well as right neck as well; denies trauma or fever, just itches terrible and only some improved with topical benadryl.  Pt denies chest pain, increased sob or doe, wheezing, orthopnea, PND, increased LE swelling, palpitations, dizziness or syncope.  Pt denies new neurological symptoms such as new headache, or facial or extremity weakness or numbness   Pt denies polydipsia, polyuria Past Medical History:  Diagnosis Date  . Allergic rhinitis, cause unspecified 08/07/2012  . COPD 03/23/2008  . Cough 11/05/2007  . HYPERTENSION 02/10/2007  . MUSCLE CRAMPS 08/09/2009  . OSTEOPOROSIS 02/10/2007  . Other and unspecified hyperlipidemia 08/07/2012  . PEPTIC ULCER DISEASE 03/23/2008  . PNEUMONIA, RIGHT UPPER LOBE 03/23/2008  . RESTLESS LEG SYNDROME 03/23/2008  . Unspecified disorder of thyroid 11/16/2007   Past Surgical History:  Procedure Laterality Date  . Breast Biopsy negative  1972    reports that she has quit smoking. She has never used smokeless tobacco. She reports current alcohol use. She reports that she does not use drugs. family history includes Leukemia in her father. Allergies  Allergen Reactions  . Alendronate Sodium Nausea Only    GI upset  . Zyrtec [Cetirizine] Itching, Rash and Other (See Comments)    Tongue burning, thrush  . Sertraline Hcl Other (See Comments)    unknown   Current Outpatient Medications on File Prior to Visit  Medication Sig Dispense Refill  . ADVAIR DISKUS 250-50 MCG/DOSE AEPB Inhale 1 puff into the lungs 2 (two) times daily as needed. 180 each 1  . ALPRAZolam (XANAX) 0.25 MG tablet Take 1 tablet (0.25 mg total) by mouth at bedtime as needed for anxiety. 30 tablet 2  . aspirin EC 81 MG tablet Take 1 tablet  (81 mg total) by mouth daily. 30 tablet 5  . atorvastatin (LIPITOR) 10 MG tablet Take 1 tablet (10 mg total) by mouth at bedtime. 90 tablet 3  . methocarbamol (ROBAXIN) 500 MG tablet Take 1 tablet (500 mg total) by mouth every 8 (eight) hours as needed. 30 tablet 0  . naproxen (NAPROSYN) 500 MG tablet Take 1 tablet (500 mg total) by mouth 2 (two) times daily. 60 tablet 1  . potassium chloride (K-DUR) 10 MEQ tablet Take 1 tablet (10 mEq total) daily by mouth. 90 tablet 3  . predniSONE (DELTASONE) 20 MG tablet Take 2 tablets (40 mg total) by mouth daily with breakfast. 10 tablet 0  . traMADol (ULTRAM) 50 MG tablet Take 1 tablet (50 mg total) by mouth every 8 (eight) hours as needed. 20 tablet 0  . triamterene-hydrochlorothiazide (MAXZIDE-25) 37.5-25 MG tablet Take 1 tablet by mouth daily. 90 tablet 3  . escitalopram (LEXAPRO) 10 MG tablet Take 1 tablet (10 mg total) by mouth daily. 90 tablet 3   No current facility-administered medications on file prior to visit.    Review of Systems  Constitutional: Negative for other unusual diaphoresis or sweats HENT: Negative for ear discharge or swelling Eyes: Negative for other worsening visual disturbances Respiratory: Negative for stridor or other swelling  Gastrointestinal: Negative for worsening distension or other blood Genitourinary: Negative for retention or other urinary change Musculoskeletal: Negative for other MSK pain or swelling Skin: Negative for color change or other new lesions  Neurological: Negative for worsening tremors and other numbness  Psychiatric/Behavioral: Negative for worsening agitation or other fatigue All other system neg per pt    Objective:   Physical Exam BP (!) 142/80 (BP Location: Left Arm)   Pulse (!) 110   Temp 98.6 F (37 C) (Oral)   Wt 105 lb 12.8 oz (48 kg)   SpO2 95%   BMI 17.08 kg/m  VS noted,  Constitutional: Pt appears in NAD HENT: Head: NCAT. Face with diffuse erythema non tender and swelling  including right neck as well, no submandibular LA or insect bite site Right Ear: External ear normal.  Left Ear: External ear normal.  Eyes: . Pupils are equal, round, and reactive to light. Conjunctivae and EOM are normal Nose: without d/c or deformity Neck: Neck supple. Gross normal ROM Cardiovascular: Normal rate and regular rhythm.   Pulmonary/Chest: Effort normal and breath sounds without rales or wheezing.  Neurological: Pt is alert. At baseline orientation, motor grossly intact Skin: Skin is warm. No rashes, other new lesions, no LE edema Psychiatric: Pt behavior is normal without agitation  No other exam findings Lab Results  Component Value Date   WBC 5.5 06/25/2018   HGB 14.3 06/25/2018   HCT 41.9 06/25/2018   PLT 300.0 06/25/2018   GLUCOSE 95 06/25/2018   CHOL 192 06/25/2018   TRIG 148.0 06/25/2018   HDL 81.30 06/25/2018   LDLDIRECT 111.6 12/14/2012   LDLCALC 81 06/25/2018   ALT 15 06/25/2018   AST 30 06/25/2018   NA 138 06/25/2018   K 3.9 06/25/2018   CL 101 06/25/2018   CREATININE 1.21 (H) 06/25/2018   BUN 39 (H) 06/25/2018   CO2 27 06/25/2018   TSH 0.78 06/25/2018   INR 1.00 02/11/2014   HGBA1C 5.7 (H) 02/12/2014       Assessment & Plan:

## 2018-11-17 NOTE — Addendum Note (Signed)
Addended by: Earnstine Regal on: 11/17/2018 01:31 PM   Modules accepted: Orders

## 2018-12-16 ENCOUNTER — Ambulatory Visit (INDEPENDENT_AMBULATORY_CARE_PROVIDER_SITE_OTHER): Payer: BC Managed Care – PPO | Admitting: Internal Medicine

## 2018-12-16 ENCOUNTER — Telehealth: Payer: Self-pay

## 2018-12-16 ENCOUNTER — Encounter: Payer: Self-pay | Admitting: Internal Medicine

## 2018-12-16 ENCOUNTER — Other Ambulatory Visit: Payer: Self-pay

## 2018-12-16 ENCOUNTER — Other Ambulatory Visit (INDEPENDENT_AMBULATORY_CARE_PROVIDER_SITE_OTHER): Payer: BC Managed Care – PPO

## 2018-12-16 VITALS — BP 122/84 | HR 94 | Temp 98.3°F | Ht 66.0 in | Wt 105.0 lb

## 2018-12-16 DIAGNOSIS — M545 Low back pain, unspecified: Secondary | ICD-10-CM

## 2018-12-16 DIAGNOSIS — R252 Cramp and spasm: Secondary | ICD-10-CM

## 2018-12-16 DIAGNOSIS — I10 Essential (primary) hypertension: Secondary | ICD-10-CM

## 2018-12-16 DIAGNOSIS — Z23 Encounter for immunization: Secondary | ICD-10-CM | POA: Diagnosis not present

## 2018-12-16 DIAGNOSIS — E876 Hypokalemia: Secondary | ICD-10-CM | POA: Diagnosis not present

## 2018-12-16 LAB — BASIC METABOLIC PANEL
BUN: 35 mg/dL — ABNORMAL HIGH (ref 6–23)
CO2: 24 mEq/L (ref 19–32)
Calcium: 9.7 mg/dL (ref 8.4–10.5)
Chloride: 101 mEq/L (ref 96–112)
Creatinine, Ser: 1.3 mg/dL — ABNORMAL HIGH (ref 0.40–1.20)
GFR: 49.39 mL/min — ABNORMAL LOW (ref >60.00)
Glucose, Bld: 92 mg/dL (ref 70–99)
Potassium: 3.9 mEq/L (ref 3.5–5.1)
Sodium: 137 mEq/L (ref 135–145)

## 2018-12-16 LAB — CK: Total CK: 91 U/L (ref 7–177)

## 2018-12-16 LAB — MAGNESIUM: Magnesium: 1.7 mg/dL (ref 1.5–2.5)

## 2018-12-16 MED ORDER — TIZANIDINE HCL 2 MG PO TABS: 2.0000 mg | ORAL_TABLET | Freq: Four times a day (QID) | ORAL | 1 refills | Status: DC | PRN

## 2018-12-16 MED ORDER — POTASSIUM CHLORIDE ER 10 MEQ PO TBCR: 10.0000 meq | EXTENDED_RELEASE_TABLET | Freq: Every day | ORAL | 3 refills | Status: DC

## 2018-12-16 NOTE — Progress Notes (Signed)
Subjective:    Patient ID: Andrea LeedsRhonda Lindsay Deisher, female    DOB: August 08, 1950, 68 y.o.   MRN: [id number]  HPI  Here with several MSK complaints, has 2 wks much worsening moderate frequency and severity of muscle cramps especially to the hands and toes. Tylenol has helped some, but still keeps coming back.  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  Has hx of mild low K.  Pt continues to have recurring LBP worse on the left, without bowel or bladder change, fever, wt loss,  worsening LE pain/numbness/weakness, gait change or falls.  Has now been going on for > 6 mo as she was not able to do the MRI that was rescheduled due to the pandemic.  Pt denies chest pain, increased sob or doe, wheezing, orthopnea, PND, increased LE swelling, palpitations, dizziness or syncope.  Pt denies new neurological symptoms such as new headache, or facial or extremity weakness or numbness   Pt denies polydipsia, polyuria Past Medical History:  Diagnosis Date  . Allergic rhinitis, cause unspecified 08/07/2012  . COPD 03/23/2008  . Cough 11/05/2007  . HYPERTENSION 02/10/2007  . MUSCLE CRAMPS 08/09/2009  . OSTEOPOROSIS 02/10/2007  . Other and unspecified hyperlipidemia 08/07/2012  . PEPTIC ULCER DISEASE 03/23/2008  . PNEUMONIA, RIGHT UPPER LOBE 03/23/2008  . RESTLESS LEG SYNDROME 03/23/2008  . Unspecified disorder of thyroid 11/16/2007   Past Surgical History:  Procedure Laterality Date  . Breast Biopsy negative  1972    reports that she has quit smoking. She has never used smokeless tobacco. She reports current alcohol use. She reports that she does not use drugs. family history includes Leukemia in her father. Allergies  Allergen Reactions  . Alendronate Sodium Nausea Only    GI upset  . Zyrtec [Cetirizine] Itching, Rash and Other (See Comments)    Tongue burning, thrush  . Sertraline Hcl Other (See  Comments)    unknown   Current Outpatient Medications on File Prior to Visit  Medication Sig Dispense Refill  . ADVAIR DISKUS 250-50 MCG/DOSE AEPB Inhale 1 puff into the lungs 2 (two) times daily as needed. 180 each 1  . ALPRAZolam (XANAX) 0.25 MG tablet Take 1 tablet (0.25 mg total) by mouth at bedtime as needed for anxiety. 30 tablet 2  . aspirin EC 81 MG tablet Take 1 tablet (81 mg total) by mouth daily. 30 tablet 5  . atorvastatin (LIPITOR) 10 MG tablet Take 1 tablet (10 mg total) by mouth at bedtime. 90 tablet 3  . hydrOXYzine (ATARAX/VISTARIL) 10 MG tablet Take 1 tablet (10 mg total) by mouth 3 (three) times daily as needed. 30 tablet 0  . traMADol (ULTRAM) 50 MG tablet Take 1 tablet (50 mg total) by mouth every 8 (eight) hours as needed. 20 tablet 0  . triamterene-hydrochlorothiazide (MAXZIDE-25) 37.5-25 MG tablet Take 1 tablet by mouth daily. 90 tablet 3  . escitalopram (LEXAPRO) 10 MG tablet Take 1 tablet (10 mg total) by mouth daily. 90 tablet 3   No current facility-administered medications on file prior to visit.    Review of Systems  Constitutional: Negative for other unusual diaphoresis or sweats HENT: Negative for ear discharge or swelling Eyes: Negative for other worsening visual disturbances Respiratory: Negative for stridor or other swelling  Gastrointestinal: Negative for worsening distension or other blood Genitourinary: Negative for retention or other urinary change Musculoskeletal: Negative for other  MSK pain or swelling Skin: Negative for color change or other new lesions Neurological: Negative for worsening tremors and other numbness  Psychiatric/Behavioral: Negative for worsening agitation or other fatigue All other system neg per pt    Objective:   Physical Exam BP 122/84   Pulse 94   Temp 98.3 F (36.8 C) (Oral)   Ht 5\' 6"  (1.676 m)   Wt 105 lb (47.6 kg)   SpO2 100%   BMI 16.95 kg/m  VS noted,  Constitutional: Pt appears in NAD HENT: Head: NCAT.   Right Ear: External ear normal.  Left Ear: External ear normal.  Eyes: . Pupils are equal, round, and reactive to light. Conjunctivae and EOM are normal Nose: without d/c or deformity Neck: Neck supple. Gross normal ROM Cardiovascular: Normal rate and regular rhythm.   Pulmonary/Chest: Effort normal and breath sounds without rales or wheezing.  Abd:  Soft, NT, ND, + BS, no organomegaly Spine nontender Neurological: Pt is alert. At baseline orientation, motor grossly intact Skin: Skin is warm. No rashes, other new lesions, no LE edema Psychiatric: Pt behavior is normal without agitation  No other exam findings Lab Results  Component Value Date   WBC 5.5 06/25/2018   HGB 14.3 06/25/2018   HCT 41.9 06/25/2018   PLT 300.0 06/25/2018   GLUCOSE 92 12/16/2018   CHOL 192 06/25/2018   TRIG 148.0 06/25/2018   HDL 81.30 06/25/2018   LDLDIRECT 111.6 12/14/2012   LDLCALC 81 06/25/2018   ALT 15 06/25/2018   AST 30 06/25/2018   NA 137 12/16/2018   K 3.9 12/16/2018   CL 101 12/16/2018   CREATININE 1.30 (H) 12/16/2018   BUN 35 (H) 12/16/2018   CO2 24 12/16/2018   TSH 0.78 06/25/2018   INR 1.00 02/11/2014   HGBA1C 5.7 (H) 02/12/2014       Assessment & Plan:

## 2018-12-16 NOTE — Telephone Encounter (Signed)
Copied from [place] [id number]. Topic: General - Other >> Dec 16, 2018  1:49 PM Mcneil, Ja-Kwan wrote: Reason for CRM: Pt stated she went to get her medications but she was told that no Rx was received for the tiZANidine (ZANAFLEX) 2 MG tablet. Pt asked that the Rx be resubmitted.

## 2018-12-16 NOTE — Patient Instructions (Signed)
You had the flu shot today  Please take all new medication as prescribed  - the muscle relaxer as needed  Please continue all other medications as before, and refills have been done if requested.  Please have the pharmacy call with any other refills you may need.  Please continue your efforts at being more active, low cholesterol diet, and weight control.  You are otherwise up to date with prevention measures today.  Please keep your appointments with your specialists as you may have planned  You will be contacted regarding the referral for: MRI for the lower  Back that was not done in March 2020 due to the pandemic  Please go to the LAB in the Basement (turn left off the elevator) for the tests to be done today  You will be contacted by phone if any changes need to be made immediately.  Otherwise, you will receive a letter about your results with an explanation, but please check with MyChart first.  Please remember to sign up for MyChart if you have not done so, as this will be important to you in the future with finding out test results, communicating by private email, and scheduling acute appointments online when needed.

## 2018-12-18 ENCOUNTER — Telehealth: Payer: Self-pay | Admitting: Internal Medicine

## 2018-12-18 DIAGNOSIS — N63 Unspecified lump in unspecified breast: Secondary | ICD-10-CM

## 2018-12-18 NOTE — Telephone Encounter (Signed)
Patient is calling because she forgot to mention to Dr Jenny Reichmann that she has mass on her right breast.  Patient is requesting advice -She should come in for an appt or can she be referred to the breast center.   Please advise [phone] call before 3p or [phone]

## 2018-12-18 NOTE — Telephone Encounter (Signed)
Ok , I have ordered a Diagnostic mammogram, so hopefully she will hear soon

## 2018-12-18 NOTE — Telephone Encounter (Signed)
Called pt, LVM.   

## 2018-12-19 ENCOUNTER — Encounter: Payer: Self-pay | Admitting: Internal Medicine

## 2018-12-19 DIAGNOSIS — E876 Hypokalemia: Secondary | ICD-10-CM | POA: Insufficient documentation

## 2018-12-19 NOTE — Assessment & Plan Note (Signed)
Also for recheck K, with mag and calcium

## 2018-12-19 NOTE — Assessment & Plan Note (Signed)
Likely due to heat related fluid shifts, for ck, BMP, tizanidine prn, and will try to cont the low dose diuretic for HTN for now

## 2018-12-19 NOTE — Assessment & Plan Note (Signed)
Chronic persistent, ill defined, for MRI as intended

## 2018-12-19 NOTE — Assessment & Plan Note (Signed)
stable overall by history and exam, recent data reviewed with pt, and pt to continue medical treatment as before,  to f/u any worsening symptoms or concerns  

## 2018-12-21 ENCOUNTER — Ambulatory Visit
Admission: RE | Admit: 2018-12-21 | Discharge: 2018-12-21 | Disposition: A | Discharge status: Home / Self Care | Payer: BC Managed Care – PPO | Source: Ambulatory Visit | Attending: Internal Medicine | Admitting: Internal Medicine

## 2018-12-21 ENCOUNTER — Other Ambulatory Visit: Payer: Self-pay

## 2018-12-21 DIAGNOSIS — N6489 Other specified disorders of breast: Secondary | ICD-10-CM | POA: Diagnosis not present

## 2018-12-21 DIAGNOSIS — R922 Inconclusive mammogram: Secondary | ICD-10-CM | POA: Diagnosis not present

## 2018-12-21 DIAGNOSIS — N63 Unspecified lump in unspecified breast: Secondary | ICD-10-CM

## 2019-01-15 ENCOUNTER — Encounter: Payer: Self-pay | Admitting: Internal Medicine

## 2019-02-04 ENCOUNTER — Ambulatory Visit
Admission: RE | Admit: 2019-02-04 | Discharge: 2019-02-04 | Disposition: A | Discharge status: Home / Self Care | Payer: BC Managed Care – PPO | Source: Ambulatory Visit | Attending: Internal Medicine | Admitting: Internal Medicine

## 2019-02-04 ENCOUNTER — Other Ambulatory Visit: Payer: Self-pay

## 2019-02-04 DIAGNOSIS — M545 Low back pain, unspecified: Secondary | ICD-10-CM

## 2019-02-04 DIAGNOSIS — M48061 Spinal stenosis, lumbar region without neurogenic claudication: Secondary | ICD-10-CM | POA: Diagnosis not present

## 2019-02-06 ENCOUNTER — Other Ambulatory Visit: Payer: BC Managed Care – PPO

## 2019-02-09 ENCOUNTER — Encounter: Payer: Self-pay | Admitting: Internal Medicine

## 2019-03-04 ENCOUNTER — Other Ambulatory Visit: Payer: Self-pay | Admitting: Internal Medicine

## 2019-03-04 MED ORDER — TRIAMTERENE-HCTZ 37.5-25 MG PO TABS: 1.0000 | ORAL_TABLET | Freq: Every day | ORAL | 3 refills | Status: DC

## 2019-03-04 NOTE — Telephone Encounter (Signed)
Copied from [place] [id number]. Topic: Quick Communication - Rx Refill/Question >> Mar 04, 2019 11:20 AM Leward Quan A wrote: Medication: triamterene-hydrochlorothiazide (MAXZIDE-25) 37.5-25 MG tablet   Has the patient contacted their pharmacy? Yes.   (Agent: If no, request that the patient contact the pharmacy for the refill.) (Agent: If yes, when and what did the pharmacy advise?)  Preferred Pharmacy (with phone number or street name): [id number] [place], [place] [phone] (Phone) [phone] (Fax)    Agent: Please be advised that RX refills may take up to 3 business days. We ask that you follow-up with your pharmacy.

## 2019-05-10 ENCOUNTER — Telehealth: Payer: Self-pay

## 2019-05-10 MED ORDER — ALPRAZOLAM 0.25 MG PO TABS: 0.2500 mg | ORAL_TABLET | Freq: Every evening | ORAL | 2 refills | Status: DC | PRN

## 2019-05-10 NOTE — Telephone Encounter (Signed)
**  Message left with [facility] on  05/08/2019 11:29:19 AM**  Medication Refill - Medication: ALPRAZolam (XANAX) 0.25 MG tablet    Has the patient contacted their pharmacy? Yes.   (Agent: If no, request that the patient contact the pharmacy for the refill.) (Agent: If yes, when and what did the pharmacy advise?)  Preferred Pharmacy (with phone number or street name): [id number] - [place], [place] - [address]  Agent: Please be advised that RX refills may take up to 3 business days. We ask that you follow-up with your pharmacy.

## 2019-05-10 NOTE — Telephone Encounter (Signed)
Done erx 

## 2019-05-10 NOTE — Telephone Encounter (Signed)
Check [place] registry last filled 02/17/2019.Marland KitchenRaechel Chute

## 2019-06-08 ENCOUNTER — Other Ambulatory Visit: Payer: Self-pay | Admitting: Internal Medicine

## 2019-06-08 DIAGNOSIS — Z1231 Encounter for screening mammogram for malignant neoplasm of breast: Secondary | ICD-10-CM

## 2019-07-01 ENCOUNTER — Other Ambulatory Visit: Payer: Self-pay | Admitting: Internal Medicine

## 2019-07-01 ENCOUNTER — Encounter: Payer: Self-pay | Admitting: Internal Medicine

## 2019-07-01 ENCOUNTER — Other Ambulatory Visit: Payer: Self-pay

## 2019-07-01 ENCOUNTER — Ambulatory Visit (INDEPENDENT_AMBULATORY_CARE_PROVIDER_SITE_OTHER): Payer: BC Managed Care – PPO | Admitting: Internal Medicine

## 2019-07-01 VITALS — BP 138/86 | HR 104 | Temp 98.9°F | Ht 66.0 in | Wt 107.0 lb

## 2019-07-01 DIAGNOSIS — E538 Deficiency of other specified B group vitamins: Secondary | ICD-10-CM | POA: Diagnosis not present

## 2019-07-01 DIAGNOSIS — E790 Hyperuricemia without signs of inflammatory arthritis and tophaceous disease: Secondary | ICD-10-CM

## 2019-07-01 DIAGNOSIS — Z0000 Encounter for general adult medical examination without abnormal findings: Secondary | ICD-10-CM

## 2019-07-01 DIAGNOSIS — E559 Vitamin D deficiency, unspecified: Secondary | ICD-10-CM | POA: Diagnosis not present

## 2019-07-01 DIAGNOSIS — E611 Iron deficiency: Secondary | ICD-10-CM | POA: Diagnosis not present

## 2019-07-01 LAB — URINALYSIS, ROUTINE W REFLEX MICROSCOPIC
Bilirubin Urine: NEGATIVE
Hgb urine dipstick: NEGATIVE
Ketones, ur: NEGATIVE
Nitrite: NEGATIVE
Specific Gravity, Urine: 1.015 (ref 1.000–1.030)
Total Protein, Urine: NEGATIVE
Urine Glucose: NEGATIVE
Urobilinogen, UA: 1 (ref 0.0–1.0)
pH: 7 (ref 5.0–8.0)

## 2019-07-01 LAB — CBC WITH DIFFERENTIAL/PLATELET
Basophils Absolute: 0 10*3/uL (ref 0.0–0.1)
Basophils Relative: 0.4 % (ref 0.0–3.0)
Eosinophils Absolute: 0.2 10*3/uL (ref 0.0–0.7)
Eosinophils Relative: 3.2 % (ref 0.0–5.0)
HCT: 38.9 % (ref 36.0–46.0)
Hemoglobin: 13.3 g/dL (ref 12.0–15.0)
Lymphocytes Relative: 20.6 % (ref 12.0–46.0)
Lymphs Abs: 1.3 10*3/uL (ref 0.7–4.0)
MCHC: 34.1 g/dL (ref 30.0–36.0)
MCV: 100.2 fl — ABNORMAL HIGH (ref 78.0–100.0)
Monocytes Absolute: 0.5 10*3/uL (ref 0.1–1.0)
Monocytes Relative: 8.6 % (ref 3.0–12.0)
Neutro Abs: 4.3 10*3/uL (ref 1.4–7.7)
Neutrophils Relative %: 67.2 % (ref 43.0–77.0)
Platelets: 306 10*3/uL (ref 150.0–400.0)
RBC: 3.89 Mil/uL (ref 3.87–5.11)
RDW: 14 % (ref 11.5–15.5)
WBC: 6.4 10*3/uL (ref 4.0–10.5)

## 2019-07-01 LAB — BASIC METABOLIC PANEL
BUN: 29 mg/dL — ABNORMAL HIGH (ref 6–23)
CO2: 28 mEq/L (ref 19–32)
Calcium: 9.5 mg/dL (ref 8.4–10.5)
Chloride: 101 mEq/L (ref 96–112)
Creatinine, Ser: 1.18 mg/dL (ref 0.40–1.20)
GFR: 55.14 mL/min — ABNORMAL LOW (ref >60.00)
Glucose, Bld: 74 mg/dL (ref 70–99)
Potassium: 3.8 mEq/L (ref 3.5–5.1)
Sodium: 140 mEq/L (ref 135–145)

## 2019-07-01 LAB — LIPID PANEL
Cholesterol: 182 mg/dL (ref 0–200)
HDL: 69.2 mg/dL (ref >39.00)
NonHDL: 112.56
Total CHOL/HDL Ratio: 3
Triglycerides: 301 mg/dL — ABNORMAL HIGH (ref 0.0–149.0)
VLDL: 60.2 mg/dL — ABNORMAL HIGH (ref 0.0–40.0)

## 2019-07-01 LAB — IBC PANEL
Iron: 97 ug/dL (ref 42–145)
Saturation Ratios: 26.4 % (ref 20.0–50.0)
Transferrin: 262 mg/dL (ref 212.0–360.0)

## 2019-07-01 LAB — HEPATIC FUNCTION PANEL
ALT: 11 U/L (ref 0–35)
AST: 22 U/L (ref 0–37)
Albumin: 4.3 g/dL (ref 3.5–5.2)
Alkaline Phosphatase: 103 U/L (ref 39–117)
Bilirubin, Direct: 0.2 mg/dL (ref 0.0–0.3)
Total Bilirubin: 0.7 mg/dL (ref 0.2–1.2)
Total Protein: 8 g/dL (ref 6.0–8.3)

## 2019-07-01 LAB — URIC ACID: Uric Acid, Serum: 9.7 mg/dL — ABNORMAL HIGH (ref 2.4–7.0)

## 2019-07-01 LAB — TSH: TSH: 0.92 u[IU]/mL (ref 0.35–4.50)

## 2019-07-01 LAB — VITAMIN B12: Vitamin B-12: 133 pg/mL — ABNORMAL LOW (ref 211–911)

## 2019-07-01 LAB — LDL CHOLESTEROL, DIRECT: Direct LDL: 73 mg/dL

## 2019-07-01 LAB — VITAMIN D 25 HYDROXY (VIT D DEFICIENCY, FRACTURES): VITD: 12.91 ng/mL — ABNORMAL LOW (ref 30.00–100.00)

## 2019-07-01 MED ORDER — ATORVASTATIN CALCIUM 10 MG PO TABS: 10.0000 mg | ORAL_TABLET | Freq: Every day | ORAL | 3 refills | Status: DC

## 2019-07-01 MED ORDER — ALPRAZOLAM 0.25 MG PO TABS: 0.2500 mg | ORAL_TABLET | Freq: Every evening | ORAL | 5 refills | Status: DC | PRN

## 2019-07-01 MED ORDER — VITAMIN D (ERGOCALCIFEROL) 1.25 MG (50000 UNIT) PO CAPS: 50000.0000 [IU] | ORAL_CAPSULE | ORAL | 0 refills | Status: DC

## 2019-07-01 MED ORDER — POTASSIUM CHLORIDE ER 10 MEQ PO TBCR: 10.0000 meq | EXTENDED_RELEASE_TABLET | Freq: Every day | ORAL | 3 refills | Status: DC

## 2019-07-01 MED ORDER — VITAMIN B-12 1000 MCG PO TABS: 1000.0000 ug | ORAL_TABLET | Freq: Every day | ORAL | 1 refills | Status: DC

## 2019-07-01 MED ORDER — ESCITALOPRAM OXALATE 10 MG PO TABS: 10.0000 mg | ORAL_TABLET | Freq: Every day | ORAL | 3 refills | Status: DC

## 2019-07-01 MED ORDER — ADVAIR DISKUS 250-50 MCG/DOSE IN AEPB: 1.0000 | INHALATION_SPRAY | Freq: Two times a day (BID) | RESPIRATORY_TRACT | 1 refills | Status: DC | PRN

## 2019-07-01 MED ORDER — TRIAMTERENE-HCTZ 37.5-25 MG PO TABS: 1.0000 | ORAL_TABLET | Freq: Every day | ORAL | 3 refills | Status: DC

## 2019-07-01 NOTE — Progress Notes (Signed)
Subjective:    Patient ID: Andrea [name], female    DOB: Nov 04, 1950, 69 y.o.   MRN: [id number]  HPI  Here for wellness and f/u;  Overall doing ok;  Pt denies Chest pain, worsening SOB, DOE, wheezing, orthopnea, PND, worsening LE edema, palpitations, dizziness or syncope.  Pt denies neurological change such as new headache, facial or extremity weakness.  Pt denies polydipsia, polyuria, or low sugar symptoms. Pt states overall good compliance with treatment and medications, good tolerability, and has been trying to follow appropriate diet.  Pt denies worsening depressive symptoms, suicidal ideation or panic. No fever, night sweats, wt loss, loss of appetite, or other constitutional symptoms.  Pt states good ability with ADL's, has low fall risk, home safety reviewed and adequate, no other significant changes in hearing or vision, and only occasionally active with exercise. Wt Readings from Last 3 Encounters:  07/01/19 107 lb (48.5 kg)  12/16/18 105 lb (47.6 kg)  11/17/18 105 lb 12.8 oz (48 kg)  Plans to retire next month at 69yo.  To see optho for cataracts soon Past Medical History:  Diagnosis Date  . Allergic rhinitis, cause unspecified 08/07/2012  . COPD 03/23/2008  . Cough 11/05/2007  . HYPERTENSION 02/10/2007  . MUSCLE CRAMPS 08/09/2009  . OSTEOPOROSIS 02/10/2007  . Other and unspecified hyperlipidemia 08/07/2012  . PEPTIC ULCER DISEASE 03/23/2008  . PNEUMONIA, RIGHT UPPER LOBE 03/23/2008  . RESTLESS LEG SYNDROME 03/23/2008  . Unspecified disorder of thyroid 11/16/2007   Past Surgical History:  Procedure Laterality Date  . Breast Biopsy negative  1972    reports that she has quit smoking. She has never used smokeless tobacco. She reports current alcohol use. She reports that she does not use drugs. family history includes Breast cancer (age of onset: 72) in her daughter; Leukemia in her father. Allergies  Allergen Reactions  . Alendronate Sodium Nausea Only    GI upset  .  Zyrtec [Cetirizine] Itching, Rash and Other (See Comments)    Tongue burning, thrush  . Sertraline Hcl Other (See Comments)    unknown   Current Outpatient Medications on File Prior to Visit  Medication Sig Dispense Refill  . aspirin EC 81 MG tablet Take 1 tablet (81 mg total) by mouth daily. 30 tablet 5  . tiZANidine (ZANAFLEX) 2 MG tablet Take 1 tablet (2 mg total) by mouth every 6 (six) hours as needed for muscle spasms. 40 tablet 1   No current facility-administered medications on file prior to visit.   Review of Systems All otherwise neg per pt     Objective:   Physical Exam BP 138/86   Pulse (!) 104   Temp 98.9 F (37.2 C)   Ht 5\' 6"  (1.676 m)   Wt 107 lb (48.5 kg)   SpO2 99%   BMI 17.27 kg/m  VS noted,  Constitutional: Pt appears in NAD HENT: Head: NCAT.  Right Ear: External ear normal.  Left Ear: External ear normal.  Eyes: . Pupils are equal, round, and reactive to light. Conjunctivae and EOM are normal Nose: without d/c or deformity Neck: Neck supple. Gross normal ROM Cardiovascular: Normal rate and regular rhythm.   Pulmonary/Chest: Effort normal and breath sounds without rales or wheezing.  Abd:  Soft, NT, ND, + BS, no organomegaly Neurological: Pt is alert. At baseline orientation, motor grossly intact Skin: Skin is warm. No rashes, other new lesions, no LE edema Psychiatric: Pt behavior is normal without agitation  All otherwise neg per pt Lab  Results  Component Value Date   WBC 6.4 07/01/2019   HGB 13.3 07/01/2019   HCT 38.9 07/01/2019   PLT 306.0 07/01/2019   GLUCOSE 74 07/01/2019   CHOL 182 07/01/2019   TRIG 301.0 (H) 07/01/2019   HDL 69.20 07/01/2019   LDLDIRECT 73.0 07/01/2019   LDLCALC 81 06/25/2018   ALT 11 07/01/2019   AST 22 07/01/2019   NA 140 07/01/2019   K 3.8 07/01/2019   CL 101 07/01/2019   CREATININE 1.18 07/01/2019   BUN 29 (H) 07/01/2019   CO2 28 07/01/2019   TSH 0.92 07/01/2019   INR 1.00 02/11/2014   HGBA1C 5.7 (H)  02/12/2014      Assessment & Plan:

## 2019-07-01 NOTE — Patient Instructions (Signed)

## 2019-07-01 NOTE — Assessment & Plan Note (Signed)

## 2019-07-05 ENCOUNTER — Other Ambulatory Visit: Payer: Self-pay

## 2019-07-05 DIAGNOSIS — Z0000 Encounter for general adult medical examination without abnormal findings: Secondary | ICD-10-CM

## 2019-07-14 DIAGNOSIS — Z1211 Encounter for screening for malignant neoplasm of colon: Secondary | ICD-10-CM | POA: Diagnosis not present

## 2019-07-14 DIAGNOSIS — Z1212 Encounter for screening for malignant neoplasm of rectum: Secondary | ICD-10-CM | POA: Diagnosis not present

## 2019-07-15 ENCOUNTER — Other Ambulatory Visit: Payer: Self-pay

## 2019-07-15 ENCOUNTER — Ambulatory Visit
Admission: RE | Admit: 2019-07-15 | Discharge: 2019-07-15 | Disposition: A | Discharge status: Home / Self Care | Payer: BC Managed Care – PPO | Source: Ambulatory Visit | Attending: Internal Medicine | Admitting: Internal Medicine

## 2019-07-15 DIAGNOSIS — Z1231 Encounter for screening mammogram for malignant neoplasm of breast: Secondary | ICD-10-CM

## 2019-07-19 LAB — COLOGUARD
COLOGUARD: NEGATIVE
Cologuard: NEGATIVE

## 2019-07-20 ENCOUNTER — Encounter: Payer: Self-pay | Admitting: Internal Medicine

## 2019-09-17 ENCOUNTER — Other Ambulatory Visit: Payer: Self-pay | Admitting: Internal Medicine

## 2019-09-17 NOTE — Telephone Encounter (Signed)
Please change to OTC Vitamin D3 at 2000 units per day, indefinitely.  

## 2019-09-21 NOTE — Telephone Encounter (Signed)
Pt called & informed of Dr Raphael Gibney response: Please change to OTC Vitamin D3 at 2000 units per day, indefinitely. Pt verb understanding.

## 2019-10-14 ENCOUNTER — Telehealth: Payer: Self-pay | Admitting: Internal Medicine

## 2019-10-14 NOTE — Telephone Encounter (Signed)
Patient wants to know if she should continue taking vitamin B-12 (CYANOCOBALAMIN) 1000 MCG tablet

## 2019-10-15 NOTE — Telephone Encounter (Signed)
Ok to stop after about 6 mo, though some take a few tabs every wk for longer than that as there is not toxicity

## 2019-10-21 IMAGING — MG DIGITAL SCREENING BILATERAL MAMMOGRAM WITH TOMO AND CAD
8 series · 9 of 24 positions shown · non-contrast
Comparison: Previous exam(s).

CLINICAL DATA: Screening.

EXAM:
DIGITAL SCREENING BILATERAL MAMMOGRAM WITH TOMO AND CAD

[R MLO synth-2D]
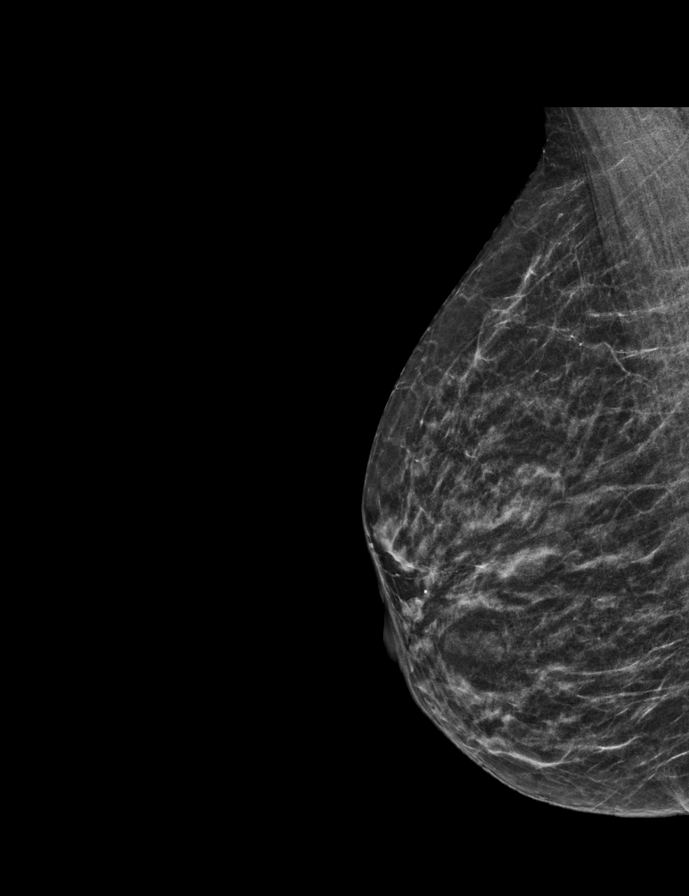

[L CC synth-2D]
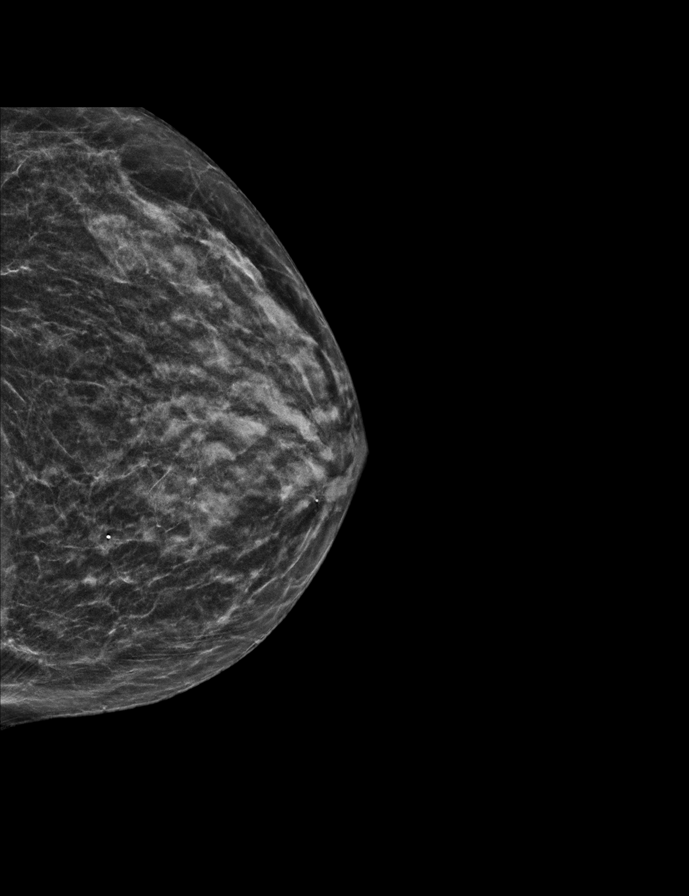

[L MLO synth-2D]
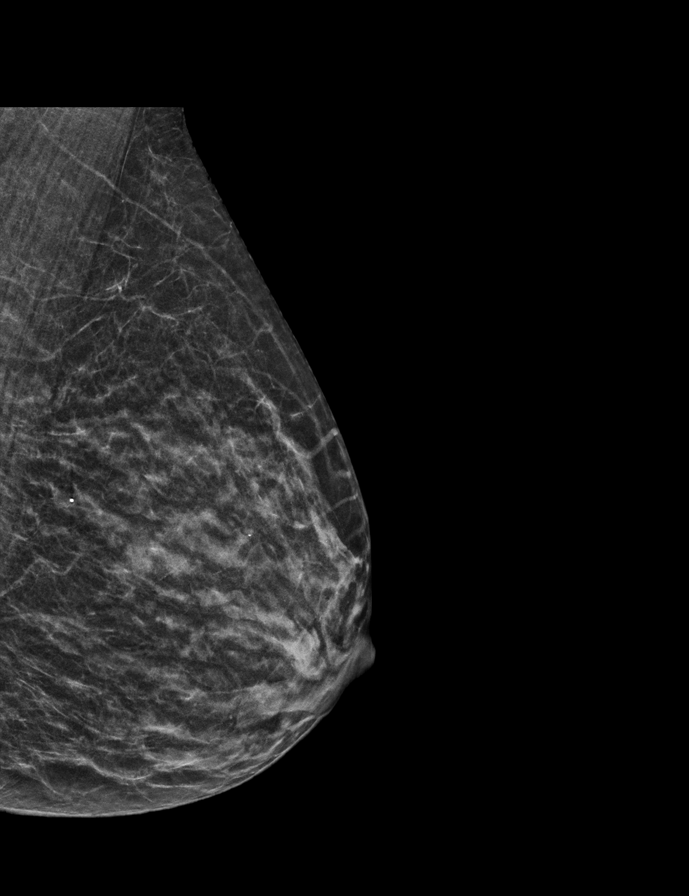

[R CC synth-2D]
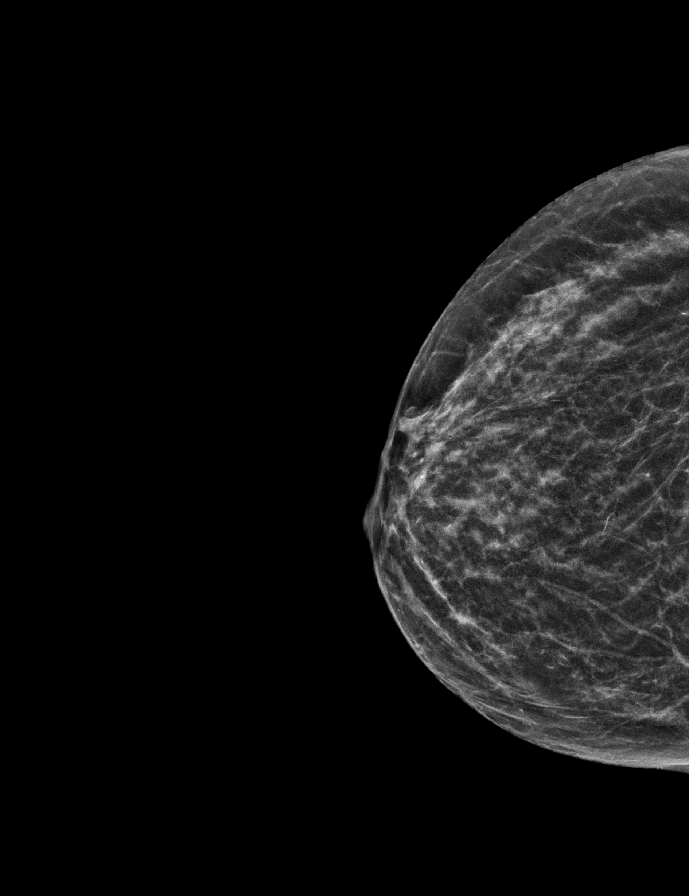

[R MLO tomo · 2 of 39 frames shown]
[frame 13/39]
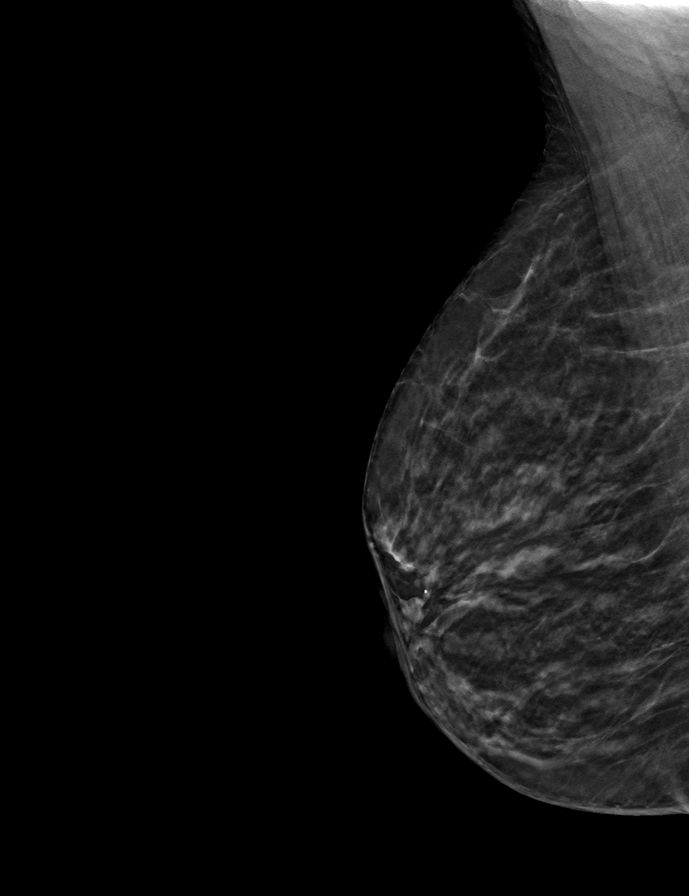
[frame 20/39]
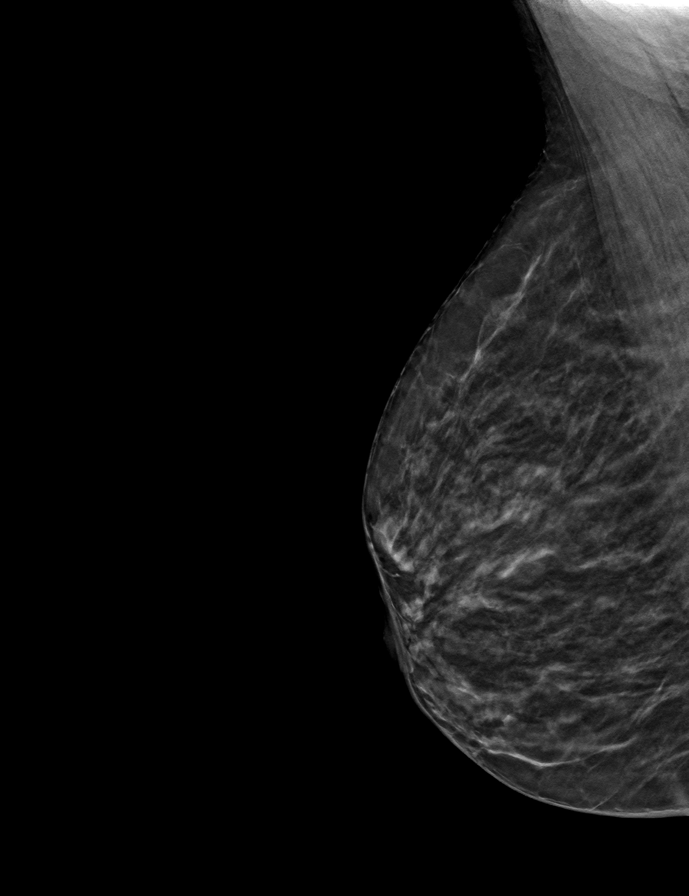

[R CC tomo · tomo slice 20/39.0]
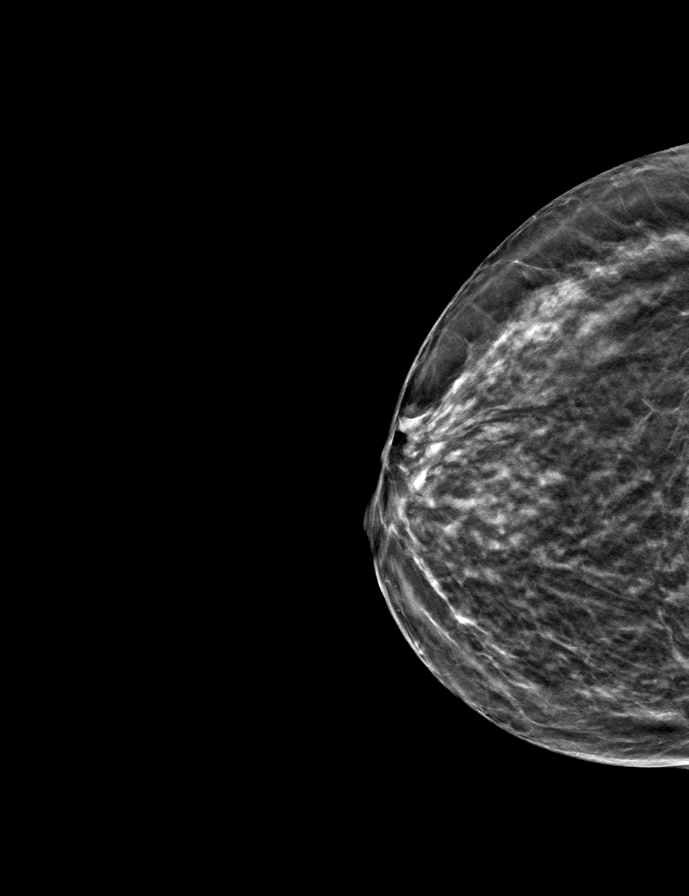

[L CC tomo · tomo slice 19/38.0]
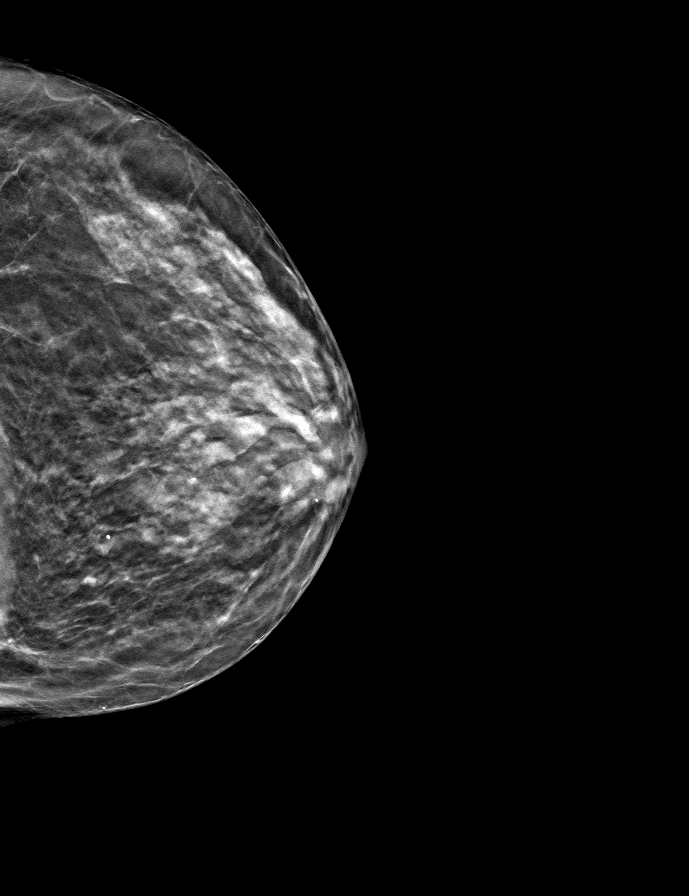

[L MLO tomo · tomo slice 19/38.0]
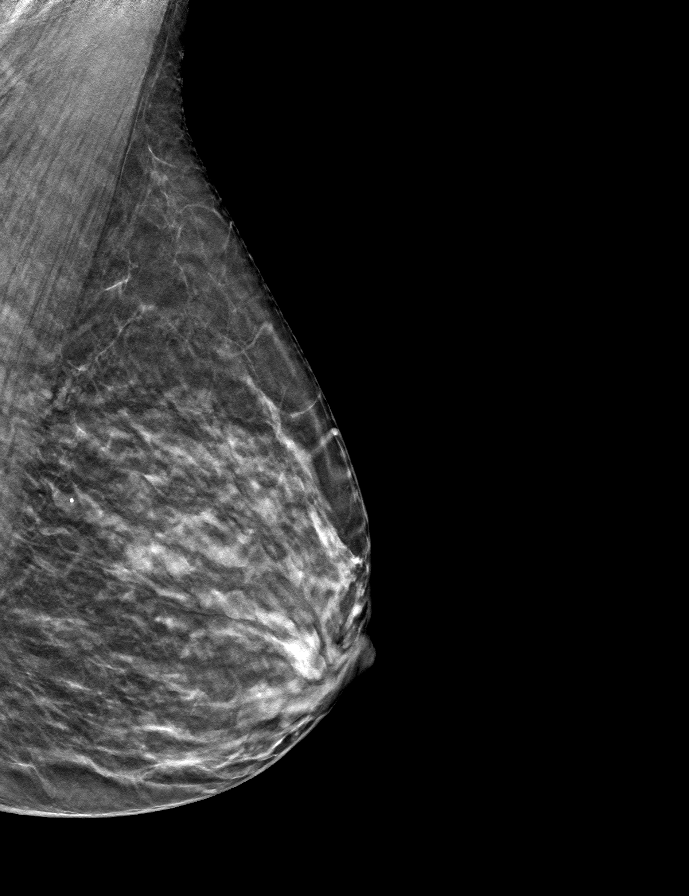

[9 of 24 positions shown; findings below may reference images not displayed]

ACR Breast Density Category c: The breast tissue is heterogeneously
dense, which may obscure small masses.
FINDINGS: There are no findings suspicious for malignancy. Images were
processed with CAD.
IMPRESSION: No mammographic evidence of malignancy. A result letter of this
screening mammogram will be mailed directly to the patient.

RECOMMENDATION:
Screening mammogram in one year. (Code:[id number])

BI-RADS CATEGORY  1: Negative.

## 2019-12-16 DIAGNOSIS — H25812 Combined forms of age-related cataract, left eye: Secondary | ICD-10-CM | POA: Diagnosis not present

## 2019-12-16 DIAGNOSIS — H25811 Combined forms of age-related cataract, right eye: Secondary | ICD-10-CM | POA: Diagnosis not present

## 2019-12-16 DIAGNOSIS — Z01818 Encounter for other preprocedural examination: Secondary | ICD-10-CM | POA: Diagnosis not present

## 2020-01-05 ENCOUNTER — Other Ambulatory Visit: Payer: Self-pay | Admitting: Internal Medicine

## 2020-01-05 NOTE — Telephone Encounter (Signed)
Please refill as per office routine med refill policy (all routine meds refilled for 3 mo or monthly per pt preference up to one year from last visit, then month to month grace period for 3 mo, then further med refills will have to be denied)  

## 2020-01-08 ENCOUNTER — Other Ambulatory Visit: Payer: Self-pay | Admitting: Internal Medicine

## 2020-01-09 NOTE — Telephone Encounter (Signed)
Done erx 

## 2020-01-10 DIAGNOSIS — H2511 Age-related nuclear cataract, right eye: Secondary | ICD-10-CM | POA: Diagnosis not present

## 2020-01-10 DIAGNOSIS — Z961 Presence of intraocular lens: Secondary | ICD-10-CM | POA: Diagnosis not present

## 2020-01-10 DIAGNOSIS — H25811 Combined forms of age-related cataract, right eye: Secondary | ICD-10-CM | POA: Diagnosis not present

## 2020-01-10 DIAGNOSIS — H2513 Age-related nuclear cataract, bilateral: Secondary | ICD-10-CM | POA: Diagnosis not present

## 2020-01-24 DIAGNOSIS — Z961 Presence of intraocular lens: Secondary | ICD-10-CM | POA: Diagnosis not present

## 2020-01-24 DIAGNOSIS — H2513 Age-related nuclear cataract, bilateral: Secondary | ICD-10-CM | POA: Diagnosis not present

## 2020-01-24 DIAGNOSIS — H25812 Combined forms of age-related cataract, left eye: Secondary | ICD-10-CM | POA: Diagnosis not present

## 2020-01-24 DIAGNOSIS — H2512 Age-related nuclear cataract, left eye: Secondary | ICD-10-CM | POA: Diagnosis not present

## 2020-02-22 DIAGNOSIS — H43393 Other vitreous opacities, bilateral: Secondary | ICD-10-CM | POA: Diagnosis not present

## 2020-04-10 ENCOUNTER — Ambulatory Visit (INDEPENDENT_AMBULATORY_CARE_PROVIDER_SITE_OTHER): Payer: Medicare Other

## 2020-04-10 ENCOUNTER — Other Ambulatory Visit: Payer: Self-pay

## 2020-04-10 ENCOUNTER — Encounter: Payer: Self-pay | Admitting: Internal Medicine

## 2020-04-10 ENCOUNTER — Ambulatory Visit (INDEPENDENT_AMBULATORY_CARE_PROVIDER_SITE_OTHER): Payer: Medicare Other | Admitting: Internal Medicine

## 2020-04-10 VITALS — BP 120/90 | HR 127 | Temp 98.9°F | Ht 66.0 in | Wt 106.0 lb

## 2020-04-10 DIAGNOSIS — R140 Abdominal distension (gaseous): Secondary | ICD-10-CM

## 2020-04-10 DIAGNOSIS — J449 Chronic obstructive pulmonary disease, unspecified: Secondary | ICD-10-CM

## 2020-04-10 DIAGNOSIS — E538 Deficiency of other specified B group vitamins: Secondary | ICD-10-CM

## 2020-04-10 DIAGNOSIS — I10 Essential (primary) hypertension: Secondary | ICD-10-CM | POA: Diagnosis not present

## 2020-04-10 DIAGNOSIS — E559 Vitamin D deficiency, unspecified: Secondary | ICD-10-CM | POA: Diagnosis not present

## 2020-04-10 DIAGNOSIS — E785 Hyperlipidemia, unspecified: Secondary | ICD-10-CM

## 2020-04-10 DIAGNOSIS — E079 Disorder of thyroid, unspecified: Secondary | ICD-10-CM

## 2020-04-10 DIAGNOSIS — K5900 Constipation, unspecified: Secondary | ICD-10-CM

## 2020-04-10 DIAGNOSIS — N1831 Chronic kidney disease, stage 3a: Secondary | ICD-10-CM

## 2020-04-10 DIAGNOSIS — J90 Pleural effusion, not elsewhere classified: Secondary | ICD-10-CM | POA: Diagnosis not present

## 2020-04-10 DIAGNOSIS — R0600 Dyspnea, unspecified: Secondary | ICD-10-CM | POA: Diagnosis not present

## 2020-04-10 LAB — CBC WITH DIFFERENTIAL/PLATELET
Basophils Absolute: 0.1 10*3/uL (ref 0.0–0.1)
Basophils Relative: 0.9 % (ref 0.0–3.0)
Eosinophils Absolute: 0.1 10*3/uL (ref 0.0–0.7)
Eosinophils Relative: 2.1 % (ref 0.0–5.0)
HCT: 41.4 % (ref 36.0–46.0)
Hemoglobin: 14.3 g/dL (ref 12.0–15.0)
Lymphocytes Relative: 13.9 % (ref 12.0–46.0)
Lymphs Abs: 0.9 10*3/uL (ref 0.7–4.0)
MCHC: 34.5 g/dL (ref 30.0–36.0)
MCV: 102.5 fl — ABNORMAL HIGH (ref 78.0–100.0)
Monocytes Absolute: 0.6 10*3/uL (ref 0.1–1.0)
Monocytes Relative: 8.8 % (ref 3.0–12.0)
Neutro Abs: 4.9 10*3/uL (ref 1.4–7.7)
Neutrophils Relative %: 74.3 % (ref 43.0–77.0)
Platelets: 314 10*3/uL (ref 150.0–400.0)
RBC: 4.03 Mil/uL (ref 3.87–5.11)
RDW: 14.4 % (ref 11.5–15.5)
WBC: 6.6 10*3/uL (ref 4.0–10.5)

## 2020-04-10 LAB — LIPID PANEL
Cholesterol: 218 mg/dL — ABNORMAL HIGH (ref 0–200)
HDL: 66.7 mg/dL (ref >39.00)
LDL Cholesterol: 114 mg/dL — ABNORMAL HIGH (ref 0–99)
NonHDL: 151.76
Total CHOL/HDL Ratio: 3
Triglycerides: 191 mg/dL — ABNORMAL HIGH (ref 0.0–149.0)
VLDL: 38.2 mg/dL (ref 0.0–40.0)

## 2020-04-10 LAB — HEPATIC FUNCTION PANEL
ALT: 25 U/L (ref 0–35)
AST: 28 U/L (ref 0–37)
Albumin: 4.5 g/dL (ref 3.5–5.2)
Alkaline Phosphatase: 43 U/L (ref 39–117)
Bilirubin, Direct: 0.2 mg/dL (ref 0.0–0.3)
Total Bilirubin: 1.2 mg/dL (ref 0.2–1.2)
Total Protein: 7 g/dL (ref 6.0–8.3)

## 2020-04-10 LAB — BASIC METABOLIC PANEL
BUN: 13 mg/dL (ref 6–23)
CO2: 29 mEq/L (ref 19–32)
Calcium: 10.2 mg/dL (ref 8.4–10.5)
Chloride: 101 mEq/L (ref 96–112)
Creatinine, Ser: 0.82 mg/dL (ref 0.40–1.20)
GFR: 73.38 mL/min (ref >60.00)
Glucose, Bld: 89 mg/dL (ref 70–99)
Potassium: 4.4 mEq/L (ref 3.5–5.1)
Sodium: 137 mEq/L (ref 135–145)

## 2020-04-10 LAB — PHOSPHORUS: Phosphorus: 2.8 mg/dL (ref 2.3–4.6)

## 2020-04-10 LAB — TSH: TSH: 1.56 u[IU]/mL (ref 0.35–4.50)

## 2020-04-10 LAB — VITAMIN D 25 HYDROXY (VIT D DEFICIENCY, FRACTURES): VITD: 26.32 ng/mL — ABNORMAL LOW (ref 30.00–100.00)

## 2020-04-10 LAB — VITAMIN B12: Vitamin B-12: 311 pg/mL (ref 211–911)

## 2020-04-10 MED ORDER — ALBUTEROL SULFATE HFA 108 (90 BASE) MCG/ACT IN AERS: 2.0000 | INHALATION_SPRAY | Freq: Four times a day (QID) | RESPIRATORY_TRACT | 3 refills | Status: AC | PRN

## 2020-04-10 NOTE — Patient Instructions (Signed)
Please continue the OTC Vitamin D and B12 supplements as you are doing  OK to also take the Senakot every day for the constipation and bloating  Please take all new medication as prescribed - the albuterol inhaler as needed for worsening shortness of breath  Please continue all other medications as before, and refills have been done if requested.  Please have the pharmacy call with any other refills you may need.  Please continue your efforts at being more active, low cholesterol diet, and weight control.  Please keep your appointments with your specialists as you may have planned  Please go to the XRAY Department in the first floor for the x-ray testing  Please go to the LAB at the blood drawing area for the tests to be done  You will be contacted by phone if any changes need to be made immediately.  Otherwise, you will receive a letter about your results with an explanation, but please check with MyChart first.  Please remember to sign up for MyChart if you have not done so, as this will be important to you in the future with finding out test results, communicating by private email, and scheduling acute appointments online when needed.  Please make an Appointment to return in 6 months, or sooner if needed

## 2020-04-10 NOTE — Progress Notes (Deleted)
   Subjective:    Patient ID: Andrea [name], female    DOB: [birth year], 69 y.o.   MRN: [id number]  HPI  Wt Readings from Last 3 Encounters:  04/10/20 106 lb (48.1 kg)  07/01/19 107 lb (48.5 kg)  12/16/18 105 lb (47.6 kg)      Review of Systems     Objective:   Physical Exam        Assessment & Plan:

## 2020-04-11 ENCOUNTER — Encounter: Payer: Self-pay | Admitting: Internal Medicine

## 2020-04-11 ENCOUNTER — Other Ambulatory Visit: Payer: Self-pay | Admitting: Internal Medicine

## 2020-04-11 MED ORDER — THERA-D 2000 50 MCG (2000 UT) PO TABS: ORAL_TABLET | ORAL | 99 refills | Status: AC

## 2020-04-11 MED ORDER — ATORVASTATIN CALCIUM 20 MG PO TABS: 20.0000 mg | ORAL_TABLET | Freq: Every day | ORAL | 3 refills | Status: DC

## 2020-04-12 LAB — PTH, INTACT AND CALCIUM
Calcium: 10.6 mg/dL — ABNORMAL HIGH (ref 8.6–10.4)
PTH: 59 pg/mL (ref 14–64)

## 2020-04-19 ENCOUNTER — Encounter: Payer: Self-pay | Admitting: Internal Medicine

## 2020-04-19 NOTE — Assessment & Plan Note (Signed)
Lab Results  Component Value Date   LDLCALC 114 (H) 04/10/2020  to consider statin, for lab today

## 2020-04-19 NOTE — Assessment & Plan Note (Signed)
Lab Results  Component Value Date   CREATININE 0.82 04/10/2020   stable overall by history and exam, recent data reviewed with pt, and pt to continue medical treatment as before,  to f/u any worsening symptoms or concerns

## 2020-04-19 NOTE — Assessment & Plan Note (Signed)
Exam benign, mild symptomatic, for albuterol hfa prn, and CXR

## 2020-04-19 NOTE — Assessment & Plan Note (Signed)
BP Readings from Last 3 Encounters:  04/10/20 120/90  07/01/19 138/86  12/16/18 122/84  stable overall by history and exam, recent data reviewed with pt, and pt to continue medical treatment as before,  to f/u any worsening symptoms or concerns

## 2020-04-19 NOTE — Assessment & Plan Note (Signed)
Cont oral replacement and f/u lab 

## 2020-04-19 NOTE — Assessment & Plan Note (Signed)
Exam benign, no evidence for obstruction,  to f/u any worsening symptoms or concerns

## 2020-04-19 NOTE — Progress Notes (Signed)
Established Patient Office Visit  Subjective:  Patient ID: Andrea [name], female    DOB: 1950-06-20  Age: 69 y.o. MRN: [id number]  .     Chief Complaint: (concise statement describing the symptom, problem, condition, diagnosis, physician recommended return, or other factor as reason for encounter) follow up CKD, low vit d and b12, bloating with constipatoin, and DOE with stairs, low thryoid       HPI:  Andrea [name] is a 69 y.o. female here to f/u; overall doing ok,  Pt denies chest pain, wheezing, orthopnea, PND, increased LE swelling, palpitations, dizziness or syncope, but has noticed mild worsening DOE with 10 stairs at her home. No fever.  Pt denies new neurological symptoms such as new headache, or facial or extremity weakness or numbness.  Pt denies polydipsia, polyuria, or symptomatic low sugars. Pt states overall good compliance with meds, mostly trying to follow appropriate diet, with wt overall stable,  but little exercise however overall. Denies worsening reflux, abd pain, dysphagia, n/v, bowel change or blood, except for the past 2 wks increased bloating and constipation - no wt loss, pain , loss of appetite or vomiting. .        Wt Readings from Last 3 Encounters:  04/10/20 106 lb (48.1 kg)  07/01/19 107 lb (48.5 kg)  12/16/18 105 lb (47.6 kg)   BP Readings from Last 3 Encounters:  04/10/20 120/90  07/01/19 138/86  12/16/18 122/84        Past Medical History:  Diagnosis Date  . Allergic rhinitis, cause unspecified 08/07/2012  . COPD 03/23/2008  . Cough 11/05/2007  . HYPERTENSION 02/10/2007  . MUSCLE CRAMPS 08/09/2009  . OSTEOPOROSIS 02/10/2007  . Other and unspecified hyperlipidemia 08/07/2012  . PEPTIC ULCER DISEASE 03/23/2008  . PNEUMONIA, RIGHT UPPER LOBE 03/23/2008  . RESTLESS LEG SYNDROME 03/23/2008  . Unspecified disorder of thyroid 11/16/2007   Past Surgical History:  Procedure Laterality Date  . Breast Biopsy negative  1972  . BREAST LUMPECTOMY  Right     reports that she has quit smoking. She has never used smokeless tobacco. She reports current alcohol use. She reports that she does not use drugs. family history includes Breast cancer (age of onset: 74) in her daughter; Leukemia in her father. Allergies  Allergen Reactions  . Alendronate Sodium Nausea Only    GI upset  . Zyrtec [Cetirizine] Itching, Rash and Other (See Comments)    Tongue burning, thrush  . Sertraline Hcl Other (See Comments)    unknown   Current Outpatient Medications on File Prior to Visit  Medication Sig Dispense Refill  . ADVAIR DISKUS 250-50 MCG/DOSE AEPB INHALE 1 PUFF INTO THE LUNGS 2 (TWO) TIMES DAILY AS NEEDED. 180 each 1  . ALPRAZolam (XANAX) 0.25 MG tablet TAKE 1 TABLET (0.25 MG TOTAL) BY MOUTH AT BEDTIME AS NEEDED FOR ANXIETY. 30 tablet 5  . aspirin EC 81 MG tablet Take 1 tablet (81 mg total) by mouth daily. 30 tablet 5  . azelastine (ASTELIN) 0.1 % nasal spray azelastine 137 mcg (0.1 %) nasal spray aerosol  Spray 2 sprays twice a day by intranasal route.    . [facility] VITAMIN B12 1000 MCG tablet TAKE 1 TABLET BY MOUTH EVERY DAY 90 tablet 1  . ipratropium (ATROVENT) 0.03 % nasal spray ipratropium bromide 21 mcg (0.03 %) nasal spray    . potassium chloride (KLOR-CON) 10 MEQ tablet Take 1 tablet (10 mEq total) by mouth daily. 90 tablet 3  . tiZANidine (ZANAFLEX)  2 MG tablet Take 1 tablet (2 mg total) by mouth every 6 (six) hours as needed for muscle spasms. 40 tablet 1  . triamterene-hydrochlorothiazide (MAXZIDE-25) 37.5-25 MG tablet Take 1 tablet by mouth daily. 90 tablet 3  . escitalopram (LEXAPRO) 10 MG tablet Take 1 tablet (10 mg total) by mouth daily. 90 tablet 3   No current facility-administered medications on file prior to visit.        ROS:  All others reviewed and negative.  Objective        PE:  BP 120/90 (BP Location: Left Arm, Patient Position: Sitting, Cuff Size: Large)   Pulse (!) 127   Temp 98.9 F (37.2 C) (Oral)   Ht 5\' 6"  (1.676  m)   Wt 106 lb (48.1 kg)   SpO2 95%   BMI 17.11 kg/m                 Constitutional: Pt appears in NAD               HENT: Head: NCAT.                Right Ear: External ear normal.                 Left Ear: External ear normal.                Eyes: . Pupils are equal, round, and reactive to light. Conjunctivae and EOM are normal               Nose: without d/c or deformity               Neck: Neck supple. Gross normal ROM               Cardiovascular: Normal rate and regular rhythm.                 Pulmonary/Chest: Effort normal and breath sounds without rales or wheezing.                Abd:  Soft, NT, ND, + BS, no organomegaly               Neurological: Pt is alert. At baseline orientation, motor grossly intact               Skin: Skin is warm. No rashes, no other new lesions, LE edema - none               Psychiatric: Pt behavior is normal without agitation   Assessment/Plan:  Andrea [name] is a 69 y.o. Black or African American [2] female with  has a past medical history of Allergic rhinitis, cause unspecified (08/07/2012), COPD (03/23/2008), Cough (11/05/2007), HYPERTENSION (02/10/2007), MUSCLE CRAMPS (08/09/2009), OSTEOPOROSIS (02/10/2007), Other and unspecified hyperlipidemia (08/07/2012), PEPTIC ULCER DISEASE (03/23/2008), PNEUMONIA, RIGHT UPPER LOBE (03/23/2008), RESTLESS LEG SYNDROME (03/23/2008), and Unspecified disorder of thyroid (11/16/2007).   Assessment Plan  See problem oriented assessment and plan Labs reviewed for each problem: Lab Results  Component Value Date   WBC 6.6 04/10/2020   HGB 14.3 04/10/2020   HCT 41.4 04/10/2020   PLT 314.0 04/10/2020   GLUCOSE 89 04/10/2020   CHOL 218 (H) 04/10/2020   TRIG 191.0 (H) 04/10/2020   HDL 66.70 04/10/2020   LDLDIRECT 73.0 07/01/2019   LDLCALC 114 (H) 04/10/2020   ALT 25 04/10/2020   AST 28 04/10/2020   NA 137 04/10/2020   K 4.4 04/10/2020   CL 101 04/10/2020  CREATININE 0.82 04/10/2020   BUN 13 04/10/2020    CO2 29 04/10/2020   TSH 1.56 04/10/2020   INR 1.00 02/11/2014   HGBA1C 5.7 (H) 02/12/2014    Micro: none  Cardiac tracings I have personally interpreted today:  none  Pertinent Radiological findings (summarize): none   I spent total 37 minutes in caring for the patient for this visit:  1) by communicating with the patient during the visit  2) by review of pertinent vital sign data, physical examination and labs as documented in the assessment and plan  3) by review of pertinent imaging - none today  4) by review of pertinent procedures - none today  5) by obtaining and reviewing separately obtained information from family/caretaker and Care Everywhere - none today  6) by ordering medications  7) by ordering tests  8) by documenting all of this clinical information in the EHR including the management of each problem noted today in assessment and plan  There are no preventive care reminders to display for this patient.  Lab Results  Component Value Date   TSH 1.56 04/10/2020   Lab Results  Component Value Date   WBC 6.6 04/10/2020   HGB 14.3 04/10/2020   HCT 41.4 04/10/2020   MCV 102.5 (H) 04/10/2020   PLT 314.0 04/10/2020   Lab Results  Component Value Date   NA 137 04/10/2020   K 4.4 04/10/2020   CO2 29 04/10/2020   GLUCOSE 89 04/10/2020   BUN 13 04/10/2020   CREATININE 0.82 04/10/2020   BILITOT 1.2 04/10/2020   ALKPHOS 43 04/10/2020   AST 28 04/10/2020   ALT 25 04/10/2020   PROT 7.0 04/10/2020   ALBUMIN 4.5 04/10/2020   CALCIUM 10.2 04/10/2020   ANIONGAP 14 02/12/2014   GFR 73.38 04/10/2020   Lab Results  Component Value Date   CHOL 218 (H) 04/10/2020   Lab Results  Component Value Date   HDL 66.70 04/10/2020   Lab Results  Component Value Date   LDLCALC 114 (H) 04/10/2020   Lab Results  Component Value Date   TRIG 191.0 (H) 04/10/2020   Lab Results  Component Value Date   CHOLHDL 3 04/10/2020   Lab Results  Component Value Date    HGBA1C 5.7 (H) 02/12/2014      Assessment & Plan:   Problem List Items Addressed This Visit      High   COPD (chronic obstructive pulmonary disease) (HCC)    Exam benign, mild symptomatic, for albuterol hfa prn, and CXR      Relevant Medications   ipratropium (ATROVENT) 0.03 % nasal spray   azelastine (ASTELIN) 0.1 % nasal spray   albuterol (VENTOLIN HFA) 108 (90 Base) MCG/ACT inhaler   Other Relevant Orders   DG Chest 2 View (Completed)     Medium   Vitamin D deficiency    Cont oral replacement and f/u lab      Relevant Orders   VITAMIN D 25 Hydroxy (Vit-D Deficiency, Fractures) (Completed)   Hyperlipidemia    Lab Results  Component Value Date   LDLCALC 114 (H) 04/10/2020  to consider statin, for lab today      Relevant Orders   Lipid panel (Completed)   Hepatic function panel (Completed)   CBC with Differential/Platelet (Completed)   Essential hypertension    BP Readings from Last 3 Encounters:  04/10/20 120/90  07/01/19 138/86  12/16/18 122/84  stable overall by history and exam, recent data reviewed with pt, and pt  to continue medical treatment as before,  to f/u any worsening symptoms or concerns       Disorder of thyroid    Lab Results  Component Value Date   TSH 1.56 04/10/2020  stable overall by history and exam, recent data reviewed with pt, and pt to continue medical treatment as before,  to f/u any worsening symptoms or concerns      Relevant Orders   TSH (Completed)   Constipation    cologuard neg 2021, Ok for senakot daily prn,  to f/u any worsening symptoms or concerns      CKD (chronic kidney disease) - Primary    Lab Results  Component Value Date   CREATININE 0.82 04/10/2020   stable overall by history and exam, recent data reviewed with pt, and pt to continue medical treatment as before,  to f/u any worsening symptoms or concerns       Relevant Orders   PTH, intact and calcium (Completed)   Basic metabolic panel (Completed)    Phosphorus (Completed)   Bloating symptom    Exam benign, no evidence for obstruction,  to f/u any worsening symptoms or concerns      B12 deficiency    Cont oral replacement and f/u lab      Relevant Orders   Vitamin B12 (Completed)      Meds ordered this encounter  Medications  . albuterol (VENTOLIN HFA) 108 (90 Base) MCG/ACT inhaler    Sig: Inhale 2 puffs into the lungs every 6 (six) hours as needed for wheezing or shortness of breath.    Dispense:  36 g    Refill:  3    Follow-up: Return in about 6 months (around 10/09/2020).   Oliver Barre, MD 04/19/2020 6:37 PM [facility] - [facility]

## 2020-04-19 NOTE — Assessment & Plan Note (Addendum)
cologuard neg 2021, Ok for senakot daily prn,  to f/u any worsening symptoms or concerns

## 2020-04-19 NOTE — Assessment & Plan Note (Signed)
Lab Results  Component Value Date   TSH 1.56 04/10/2020  stable overall by history and exam, recent data reviewed with pt, and pt to continue medical treatment as before,  to f/u any worsening symptoms or concerns

## 2020-05-12 ENCOUNTER — Other Ambulatory Visit: Payer: Self-pay | Admitting: Internal Medicine

## 2020-05-14 NOTE — Telephone Encounter (Signed)
Sorry, no refill needed 

## 2020-05-14 NOTE — Telephone Encounter (Signed)
Please refill as per office routine med refill policy (all routine meds refilled for 3 mo or monthly per pt preference up to one year from last visit, then month to month grace period for 3 mo, then further med refills will have to be denied)  

## 2020-05-15 NOTE — Telephone Encounter (Signed)
This apparently needs to be resent to pharmacy, thanks 

## 2020-05-17 ENCOUNTER — Other Ambulatory Visit: Payer: Self-pay | Admitting: Internal Medicine

## 2020-05-18 DIAGNOSIS — H43393 Other vitreous opacities, bilateral: Secondary | ICD-10-CM | POA: Diagnosis not present

## 2020-05-18 DIAGNOSIS — H40033 Anatomical narrow angle, bilateral: Secondary | ICD-10-CM | POA: Diagnosis not present

## 2020-06-02 ENCOUNTER — Other Ambulatory Visit: Payer: Self-pay | Admitting: Internal Medicine

## 2020-06-13 ENCOUNTER — Other Ambulatory Visit: Payer: Self-pay | Admitting: Internal Medicine

## 2020-06-13 DIAGNOSIS — Z1231 Encounter for screening mammogram for malignant neoplasm of breast: Secondary | ICD-10-CM

## 2020-06-27 ENCOUNTER — Other Ambulatory Visit: Payer: Self-pay | Admitting: Internal Medicine

## 2020-06-28 ENCOUNTER — Telehealth: Payer: Self-pay | Admitting: Internal Medicine

## 2020-06-28 MED ORDER — ATORVASTATIN CALCIUM 20 MG PO TABS: 20.0000 mg | ORAL_TABLET | Freq: Every day | ORAL | 3 refills | Status: DC

## 2020-06-28 NOTE — Telephone Encounter (Signed)
Please refill as per office routine med refill policy (all routine meds refilled for 3 mo or monthly per pt preference up to one year from last visit, then month to month grace period for 3 mo, then further med refills will have to be denied)  

## 2020-06-28 NOTE — Telephone Encounter (Signed)
Patient has called in regards to needing a refill on her medication   atorvastatin (LIPITOR) 20 MG tablet  She mentioned the pharmacy only gave her 10mg  tablets instead of 20mg  so she has been taking 2 and that is why she is out.

## 2020-06-28 NOTE — Telephone Encounter (Signed)
Ok done

## 2020-06-28 NOTE — Telephone Encounter (Signed)
Sorry no refill needed 

## 2020-08-02 ENCOUNTER — Other Ambulatory Visit: Payer: Self-pay

## 2020-08-02 ENCOUNTER — Ambulatory Visit
Admission: RE | Admit: 2020-08-02 | Discharge: 2020-08-02 | Disposition: A | Discharge status: Home / Self Care | Payer: Medicare Other | Source: Ambulatory Visit | Attending: Internal Medicine | Admitting: Internal Medicine

## 2020-08-02 DIAGNOSIS — Z1231 Encounter for screening mammogram for malignant neoplasm of breast: Secondary | ICD-10-CM

## 2020-08-07 ENCOUNTER — Other Ambulatory Visit: Payer: Self-pay | Admitting: Internal Medicine

## 2020-08-28 ENCOUNTER — Other Ambulatory Visit: Payer: Self-pay | Admitting: Internal Medicine

## 2020-10-12 ENCOUNTER — Ambulatory Visit (INDEPENDENT_AMBULATORY_CARE_PROVIDER_SITE_OTHER): Payer: Medicare Other | Admitting: Internal Medicine

## 2020-10-12 ENCOUNTER — Other Ambulatory Visit: Payer: Self-pay

## 2020-10-12 ENCOUNTER — Encounter: Payer: Self-pay | Admitting: Internal Medicine

## 2020-10-12 VITALS — BP 122/86 | HR 111 | Temp 98.6°F | Ht 66.0 in | Wt 109.4 lb

## 2020-10-12 DIAGNOSIS — N1831 Chronic kidney disease, stage 3a: Secondary | ICD-10-CM

## 2020-10-12 DIAGNOSIS — J449 Chronic obstructive pulmonary disease, unspecified: Secondary | ICD-10-CM

## 2020-10-12 DIAGNOSIS — F419 Anxiety disorder, unspecified: Secondary | ICD-10-CM

## 2020-10-12 DIAGNOSIS — R140 Abdominal distension (gaseous): Secondary | ICD-10-CM | POA: Diagnosis not present

## 2020-10-12 DIAGNOSIS — E7800 Pure hypercholesterolemia, unspecified: Secondary | ICD-10-CM

## 2020-10-12 DIAGNOSIS — Z0001 Encounter for general adult medical examination with abnormal findings: Secondary | ICD-10-CM

## 2020-10-12 DIAGNOSIS — E559 Vitamin D deficiency, unspecified: Secondary | ICD-10-CM

## 2020-10-12 DIAGNOSIS — I10 Essential (primary) hypertension: Secondary | ICD-10-CM

## 2020-10-12 DIAGNOSIS — M109 Gout, unspecified: Secondary | ICD-10-CM

## 2020-10-12 DIAGNOSIS — E538 Deficiency of other specified B group vitamins: Secondary | ICD-10-CM

## 2020-10-12 DIAGNOSIS — F32A Depression, unspecified: Secondary | ICD-10-CM | POA: Diagnosis not present

## 2020-10-12 LAB — CBC WITH DIFFERENTIAL/PLATELET
Basophils Absolute: 0 10*3/uL (ref 0.0–0.1)
Basophils Relative: 0.9 % (ref 0.0–3.0)
Eosinophils Absolute: 0.1 10*3/uL (ref 0.0–0.7)
Eosinophils Relative: 2.8 % (ref 0.0–5.0)
HCT: 39.8 % (ref 36.0–46.0)
Hemoglobin: 13.8 g/dL (ref 12.0–15.0)
Lymphocytes Relative: 25.7 % (ref 12.0–46.0)
Lymphs Abs: 1.3 10*3/uL (ref 0.7–4.0)
MCHC: 34.6 g/dL (ref 30.0–36.0)
MCV: 103.3 fl — ABNORMAL HIGH (ref 78.0–100.0)
Monocytes Absolute: 0.4 10*3/uL (ref 0.1–1.0)
Monocytes Relative: 8.3 % (ref 3.0–12.0)
Neutro Abs: 3 10*3/uL (ref 1.4–7.7)
Neutrophils Relative %: 62.3 % (ref 43.0–77.0)
Platelets: 252 10*3/uL (ref 150.0–400.0)
RBC: 3.86 Mil/uL — ABNORMAL LOW (ref 3.87–5.11)
RDW: 15.3 % (ref 11.5–15.5)
WBC: 4.9 10*3/uL (ref 4.0–10.5)

## 2020-10-12 LAB — URINALYSIS, ROUTINE W REFLEX MICROSCOPIC
Bilirubin Urine: NEGATIVE
Hgb urine dipstick: NEGATIVE
Ketones, ur: NEGATIVE
Leukocytes,Ua: NEGATIVE
Nitrite: NEGATIVE
RBC / HPF: NONE SEEN (ref 0–?)
Specific Gravity, Urine: 1.02 (ref 1.000–1.030)
Total Protein, Urine: NEGATIVE
Urine Glucose: NEGATIVE
Urobilinogen, UA: 1 (ref 0.0–1.0)
pH: 6 (ref 5.0–8.0)

## 2020-10-12 LAB — LDL CHOLESTEROL, DIRECT: Direct LDL: 39 mg/dL

## 2020-10-12 LAB — TSH: TSH: 0.69 u[IU]/mL (ref 0.35–4.50)

## 2020-10-12 LAB — LIPID PANEL
Cholesterol: 160 mg/dL (ref 0–200)
HDL: 66 mg/dL (ref >39.00)
Total CHOL/HDL Ratio: 2
Triglycerides: 563 mg/dL — ABNORMAL HIGH (ref 0.0–149.0)

## 2020-10-12 LAB — URIC ACID: Uric Acid, Serum: 6.3 mg/dL (ref 2.4–7.0)

## 2020-10-12 LAB — HEPATIC FUNCTION PANEL
ALT: 19 U/L (ref 0–35)
AST: 34 U/L (ref 0–37)
Albumin: 4.4 g/dL (ref 3.5–5.2)
Alkaline Phosphatase: 140 U/L — ABNORMAL HIGH (ref 39–117)
Bilirubin, Direct: 0.2 mg/dL (ref 0.0–0.3)
Total Bilirubin: 0.9 mg/dL (ref 0.2–1.2)
Total Protein: 8.2 g/dL (ref 6.0–8.3)

## 2020-10-12 LAB — BASIC METABOLIC PANEL
BUN: 28 mg/dL — ABNORMAL HIGH (ref 6–23)
CO2: 24 mEq/L (ref 19–32)
Calcium: 9.7 mg/dL (ref 8.4–10.5)
Chloride: 102 mEq/L (ref 96–112)
Creatinine, Ser: 0.98 mg/dL (ref 0.40–1.20)
GFR: 59.04 mL/min — ABNORMAL LOW (ref >60.00)
Glucose, Bld: 81 mg/dL (ref 70–99)
Potassium: 3.6 mEq/L (ref 3.5–5.1)
Sodium: 139 mEq/L (ref 135–145)

## 2020-10-12 LAB — VITAMIN D 25 HYDROXY (VIT D DEFICIENCY, FRACTURES): VITD: 51.77 ng/mL (ref 30.00–100.00)

## 2020-10-12 LAB — VITAMIN B12: Vitamin B-12: >1550 pg/mL — ABNORMAL HIGH (ref 211–911)

## 2020-10-12 LAB — PHOSPHORUS: Phosphorus: 3.2 mg/dL (ref 2.3–4.6)

## 2020-10-12 MED ORDER — PREDNISONE 10 MG PO TABS: ORAL_TABLET | ORAL | 0 refills | Status: DC

## 2020-10-12 MED ORDER — ALPRAZOLAM 0.25 MG PO TABS: 0.2500 mg | ORAL_TABLET | Freq: Every evening | ORAL | 1 refills | Status: DC | PRN

## 2020-10-12 MED ORDER — ALLOPURINOL 300 MG PO TABS: 300.0000 mg | ORAL_TABLET | Freq: Every day | ORAL | 3 refills | Status: DC

## 2020-10-12 NOTE — Assessment & Plan Note (Signed)
Last vitamin D Lab Results  Component Value Date   VD25OH 26.32 (L) 04/10/2020   Low, to start oral replacement

## 2020-10-12 NOTE — Patient Instructions (Addendum)
Please take all new medication as prescribed - the prednisone, and the allopurinol  Please continue all other medications as before, and refills have been done if requested - the alprazolam  You are given the handicapped parking application signed today  Please have the pharmacy call with any other refills you may need.  Please continue your efforts at being more active, low cholesterol diet, and weight control.  You are otherwise up to date with prevention measures today.  Please keep your appointments with your specialists as you may have planned  Please go to the LAB at the blood drawing area for the tests to be done  You will be contacted by phone if any changes need to be made immediately.  Otherwise, you will receive a letter about your results with an explanation, but please check with MyChart first.  Please remember to sign up for MyChart if you have not done so, as this will be important to you in the future with finding out test results, communicating by private email, and scheduling acute appointments online when needed.  Please make an Appointment to return in 6 months, or sooner if needed

## 2020-10-12 NOTE — Progress Notes (Signed)
Patient ID: Andrea [name], female   DOB: Apr 29, 1950, 70 y.o.   MRN: [id number]         Chief Complaint:: wellness exam and Follow-up  Low vit d, hld, copd, anxiety, acute gouty arthritis       HPI:  Andrea LeedsRhonda Lindsay Macnaughton is a 70 y.o. female here for wellness exam; declines shingrix and dxa, o/w up to date with preventive referrals and immunizations                        Also c/o acute onset red, tender swelling to several joints in the past month, mild overall and resolved except for large mod to severe tender involvement of left hand 5th finger PIP with large swelling, tender, no trauma.  Has hx of gout but has not had to take preventive in past.  Asking for tx and preventive.  Also taking Vit D 2000 u  Tolerating new lipitor 20 mg from last visit well.  COPD is overall stable, Pt denies chest pain, increased sob or doe, wheezing, orthopnea, PND, increased LE swelling, palpitations, dizziness or syncope, but is asking for handicapped parking application as she is more and more diffuculty with ambulating more then 100 yds, such as in the building from the parking lot.  Denies worsening depressive symptoms, suicidal ideation, or panic; has ongoing anxiety, asks for xanax refill.   Pt denies polydipsia, polyuria, or new focal neuro s/s.  Denies worsening reflux, abd pain, dysphagia, n/v, or blood, except for mild worsening constipation recently, now with primarily bloating symptoms, has not been taking colace and/or miralax recently.     Wt Readings from Last 3 Encounters:  10/12/20 109 lb 6.4 oz (49.6 kg)  04/10/20 106 lb (48.1 kg)  07/01/19 107 lb (48.5 kg)   BP Readings from Last 3 Encounters:  10/12/20 122/86  04/10/20 120/90  07/01/19 138/86   Immunization History  Administered Date(s) Administered   Fluad Quad(high Dose 65+) 12/16/2018   Hepatitis B, adult 04/05/2014, 12/07/2014   Influenza Whole 01/20/2009   Influenza,inj,Quad PF,6+ Mos 02/24/2014   Influenza-Unspecified  01/20/2017, 02/22/2020   PFIZER Comirnaty(Gray Top)Covid-19 Tri-Sucrose Vaccine 07/26/2020   PFIZER(Purple Top)SARS-COV-2 Vaccination 05/13/2019, 06/02/2019, 01/21/2020   Pneumococcal Conjugate-13 04/09/2017   Pneumococcal Polysaccharide-23 08/09/2009, 06/25/2018   Tdap 05/15/2018   There are no preventive care reminders to display for this patient.     Past Medical History:  Diagnosis Date   Allergic rhinitis, cause unspecified 08/07/2012   COPD 03/23/2008   Cough 11/05/2007   HYPERTENSION 02/10/2007   MUSCLE CRAMPS 08/09/2009   OSTEOPOROSIS 02/10/2007   Other and unspecified hyperlipidemia 08/07/2012   PEPTIC ULCER DISEASE 03/23/2008   PNEUMONIA, RIGHT UPPER LOBE 03/23/2008   RESTLESS LEG SYNDROME 03/23/2008   Unspecified disorder of thyroid 11/16/2007   Past Surgical History:  Procedure Laterality Date   Breast Biopsy negative  1972   BREAST LUMPECTOMY Right     reports that she has quit smoking. She has never used smokeless tobacco. She reports current alcohol use. She reports that she does not use drugs. family history includes Breast cancer (age of onset: 2048) in her daughter; Leukemia in her father. Allergies  Allergen Reactions   Alendronate Sodium Nausea Only    GI upset   Zyrtec [Cetirizine] Itching, Rash and Other (See Comments)    Tongue burning, thrush   Sertraline Hcl Other (See Comments)    unknown   Current Outpatient Medications on File Prior to Visit  Medication  Sig Dispense Refill   ADVAIR DISKUS 250-50 MCG/DOSE AEPB INHALE 1 PUFF INTO THE LUNGS 2 (TWO) TIMES DAILY AS NEEDED. 180 each 1   albuterol (VENTOLIN HFA) 108 (90 Base) MCG/ACT inhaler Inhale 2 puffs into the lungs every 6 (six) hours as needed for wheezing or shortness of breath. 36 g 3   aspirin EC 81 MG tablet Take 1 tablet (81 mg total) by mouth daily. 30 tablet 5   atorvastatin (LIPITOR) 20 MG tablet Take 1 tablet (20 mg total) by mouth daily. 90 tablet 3   azelastine (ASTELIN) 0.1 % nasal spray  azelastine 137 mcg (0.1 %) nasal spray aerosol  Spray 2 sprays twice a day by intranasal route.     Cholecalciferol (THERA-D 2000) 50 MCG (2000 UT) TABS 1 tab by mouth once daily 90 tablet 99   [facility] VITAMIN B12 1000 MCG tablet TAKE 1 TABLET BY MOUTH EVERY DAY 90 tablet 1   ipratropium (ATROVENT) 0.03 % nasal spray ipratropium bromide 21 mcg (0.03 %) nasal spray     potassium chloride (KLOR-CON) 10 MEQ tablet Take 1 tablet (10 mEq total) by mouth daily. 90 tablet 3   tiZANidine (ZANAFLEX) 2 MG tablet Take 1 tablet (2 mg total) by mouth every 6 (six) hours as needed for muscle spasms. 40 tablet 1   triamterene-hydrochlorothiazide (MAXZIDE-25) 37.5-25 MG tablet TAKE 1 TABLET BY MOUTH EVERY DAY 90 tablet 3   escitalopram (LEXAPRO) 10 MG tablet Take 1 tablet (10 mg total) by mouth daily. 90 tablet 3   No current facility-administered medications on file prior to visit.        ROS:  All others reviewed and negative.  Objective        PE:  BP 122/86 (BP Location: Left Arm, Patient Position: Sitting, Cuff Size: Normal)   Pulse (!) 111   Temp 98.6 F (37 C) (Oral)   Ht 5\' 6"  (1.676 m)   Wt 109 lb 6.4 oz (49.6 kg)   SpO2 97%   BMI 17.66 kg/m                 Constitutional: Pt appears in NAD               HENT: Head: NCAT.                Right Ear: External ear normal.                 Left Ear: External ear normal.                Eyes: . Pupils are equal, round, and reactive to light. Conjunctivae and EOM are normal               Nose: without d/c or deformity               Neck: Neck supple. Gross normal ROM               Cardiovascular: Normal rate and regular rhythm.                 Pulmonary/Chest: Effort normal and breath sounds decreased without rales or wheezing.                Abd:  Soft, NT, ND, + BS, no organomegaly\                Left 5th finger PIP wiwth 2+ tender warm swelling and reduced ROM  Neurological: Pt is alert. At baseline orientation, motor grossly  intact               Skin: Skin is warm. No rashes, no other new lesions, LE edema - none               Psychiatric: Pt behavior is normal without agitation   Micro: none  Cardiac tracings I have personally interpreted today:  none  Pertinent Radiological findings (summarize): none   Lab Results  Component Value Date   WBC 4.9 10/12/2020   HGB 13.8 10/12/2020   HCT 39.8 10/12/2020   PLT 252.0 10/12/2020   GLUCOSE 81 10/12/2020   CHOL 160 10/12/2020   TRIG (H) 10/12/2020    563.0 Triglyceride is over 400; calculations on Lipids are invalid.   HDL 66.00 10/12/2020   LDLDIRECT 39.0 10/12/2020   LDLCALC 114 (H) 04/10/2020   ALT 19 10/12/2020   AST 34 10/12/2020   NA 139 10/12/2020   K 3.6 10/12/2020   CL 102 10/12/2020   CREATININE 0.98 10/12/2020   BUN 28 (H) 10/12/2020   CO2 24 10/12/2020   TSH 0.69 10/12/2020   INR 1.00 02/11/2014   HGBA1C 5.7 (H) 02/12/2014   Assessment/Plan:  Malika Demario is a 70 y.o. Black or African American [2] female with  has a past medical history of Allergic rhinitis, cause unspecified (08/07/2012), COPD (03/23/2008), Cough (11/05/2007), HYPERTENSION (02/10/2007), MUSCLE CRAMPS (08/09/2009), OSTEOPOROSIS (02/10/2007), Other and unspecified hyperlipidemia (08/07/2012), PEPTIC ULCER DISEASE (03/23/2008), PNEUMONIA, RIGHT UPPER LOBE (03/23/2008), RESTLESS LEG SYNDROME (03/23/2008), and Unspecified disorder of thyroid (11/16/2007).  Vitamin D deficiency Last vitamin D Lab Results  Component Value Date   VD25OH 26.32 (L) 04/10/2020   Low, to start oral replacement   Hyperlipidemia Lab Results  Component Value Date   LDLCALC 114 (H) 04/10/2020   Uncontrolled, goal at least < 100, pt to continue current statin lipitor 20 and recheck labs today   Encounter for well adult exam with abnormal findings Age and sex appropriate education and counseling updated with regular exercise and diet Referrals for preventative services - declines dxa for  now Immunizations addressed - declines shingrix Smoking counseling  - none needed Evidence for depression or other mood disorder - none significant Most recent labs reviewed. I have personally reviewed and have noted: 1) the patient's medical and social history 2) The patient's current medications and supplements 3) The patient's height, weight, and BMI have been recorded in the chart   Acute gouty arthritis Mild but recurring mult joints recently, now for predpac asd, and allopurinol 300 qd  B12 deficiency Lab Results  Component Value Date   VITAMINB12 >1550 (H) 10/12/2020   Stable, cont oral replacement - b12 1000 mcg qd   Anxiety and depression Overall stable, for xanax refill, but consider change to klononpin if less effective in future given her copd  CKD (chronic kidney disease) Lab Results  Component Value Date   CREATININE 0.98 10/12/2020   Stable overall, cont to avoid nephrotoxins   Essential hypertension BP Readings from Last 3 Encounters:  10/12/20 122/86  04/10/20 120/90  07/01/19 138/86   Stable, pt to continue medical treatment maxide   COPD (chronic obstructive pulmonary disease) Overall stable, cont current med tx but ok for handicapped parking application   Bloating symptom With ongoing constipation, for colace/miralax prn, also probiotic prn,  to f/u any worsening symptoms or concerns  Followup: Return in about 6 months (around 04/13/2021).  Oliver Barre, MD 10/15/2020  1:40 PM [facility]

## 2020-10-12 NOTE — Assessment & Plan Note (Signed)
Lab Results  Component Value Date   LDLCALC 114 (H) 04/10/2020   Uncontrolled, goal at least < 100, pt to continue current statin lipitor 20 and recheck labs today

## 2020-10-13 ENCOUNTER — Encounter: Payer: Self-pay | Admitting: Internal Medicine

## 2020-10-14 LAB — PTH, INTACT AND CALCIUM
Calcium: 9.8 mg/dL (ref 8.6–10.4)
PTH: 69 pg/mL (ref 16–77)

## 2020-10-15 ENCOUNTER — Encounter: Payer: Self-pay | Admitting: Internal Medicine

## 2020-10-15 NOTE — Assessment & Plan Note (Signed)
With ongoing constipation, for colace/miralax prn, also probiotic prn,  to f/u any worsening symptoms or concerns

## 2020-10-15 NOTE — Assessment & Plan Note (Signed)
BP Readings from Last 3 Encounters:  10/12/20 122/86  04/10/20 120/90  07/01/19 138/86   Stable, pt to continue medical treatment maxide

## 2020-10-15 NOTE — Assessment & Plan Note (Signed)
Overall stable, cont current med tx but ok for handicapped parking application

## 2020-10-15 NOTE — Assessment & Plan Note (Signed)
Lab Results  Component Value Date   VITAMINB12 >1550 (H) 10/12/2020   Stable, cont oral replacement - b12 1000 mcg qd

## 2020-10-15 NOTE — Assessment & Plan Note (Signed)
Age and sex appropriate education and counseling updated with regular exercise and diet Referrals for preventative services - declines dxa for now Immunizations addressed - declines shingrix Smoking counseling  - none needed Evidence for depression or other mood disorder - none significant Most recent labs reviewed. I have personally reviewed and have noted: 1) the patient's medical and social history 2) The patient's current medications and supplements 3) The patient's height, weight, and BMI have been recorded in the chart

## 2020-10-15 NOTE — Assessment & Plan Note (Signed)
Lab Results  Component Value Date   CREATININE 0.98 10/12/2020   Stable overall, cont to avoid nephrotoxins

## 2020-10-15 NOTE — Assessment & Plan Note (Signed)
Overall stable, for xanax refill, but consider change to klononpin if less effective in future given her copd

## 2020-10-15 NOTE — Assessment & Plan Note (Signed)
Mild but recurring mult joints recently, now for predpac asd, and allopurinol 300 qd

## 2021-01-04 ENCOUNTER — Telehealth: Payer: Self-pay | Admitting: Internal Medicine

## 2021-01-04 NOTE — Telephone Encounter (Signed)
Type of form received handicapped placard  Form placed in Provider mailbox   Additional instructions from the patient: please mail to patient   Things to remember: Front office: If form received in person, remind patient that forms take 7-10 business days CMA should attach charge sheet and put on Supervisor's desk

## 2021-01-08 ENCOUNTER — Other Ambulatory Visit: Payer: Self-pay | Admitting: Internal Medicine

## 2021-01-08 MED ORDER — ADVAIR DISKUS 250-50 MCG/ACT IN AEPB: INHALATION_SPRAY | RESPIRATORY_TRACT | 1 refills | Status: DC

## 2021-01-08 NOTE — Telephone Encounter (Signed)
Patient calling in to make sure refill request was submitted for medication

## 2021-01-08 NOTE — Addendum Note (Signed)
Addended by: Corwin Levins on: 01/08/2021 01:03 PM   Modules accepted: Orders

## 2021-01-11 NOTE — Telephone Encounter (Signed)
Form completed and waiting for patient pickup at the front. Patient notified.

## 2021-04-13 ENCOUNTER — Encounter: Payer: Self-pay | Admitting: Internal Medicine

## 2021-04-13 ENCOUNTER — Ambulatory Visit (INDEPENDENT_AMBULATORY_CARE_PROVIDER_SITE_OTHER): Payer: Medicare Other | Admitting: Internal Medicine

## 2021-04-13 ENCOUNTER — Other Ambulatory Visit: Payer: Self-pay

## 2021-04-13 VITALS — BP 122/80 | HR 105 | Ht 66.0 in | Wt 110.0 lb

## 2021-04-13 DIAGNOSIS — J449 Chronic obstructive pulmonary disease, unspecified: Secondary | ICD-10-CM | POA: Diagnosis not present

## 2021-04-13 DIAGNOSIS — M109 Gout, unspecified: Secondary | ICD-10-CM

## 2021-04-13 DIAGNOSIS — E7800 Pure hypercholesterolemia, unspecified: Secondary | ICD-10-CM | POA: Diagnosis not present

## 2021-04-13 DIAGNOSIS — N1831 Chronic kidney disease, stage 3a: Secondary | ICD-10-CM

## 2021-04-13 DIAGNOSIS — E559 Vitamin D deficiency, unspecified: Secondary | ICD-10-CM

## 2021-04-13 DIAGNOSIS — E538 Deficiency of other specified B group vitamins: Secondary | ICD-10-CM

## 2021-04-13 DIAGNOSIS — I10 Essential (primary) hypertension: Secondary | ICD-10-CM | POA: Diagnosis not present

## 2021-04-13 LAB — CBC WITH DIFFERENTIAL/PLATELET
Basophils Absolute: 0 10*3/uL (ref 0.0–0.1)
Basophils Relative: 0.6 % (ref 0.0–3.0)
Eosinophils Absolute: 0.2 10*3/uL (ref 0.0–0.7)
Eosinophils Relative: 3.7 % (ref 0.0–5.0)
HCT: 38.6 % (ref 36.0–46.0)
Hemoglobin: 13.2 g/dL (ref 12.0–15.0)
Lymphocytes Relative: 24.1 % (ref 12.0–46.0)
Lymphs Abs: 1.3 10*3/uL (ref 0.7–4.0)
MCHC: 34.3 g/dL (ref 30.0–36.0)
MCV: 103.2 fl — ABNORMAL HIGH (ref 78.0–100.0)
Monocytes Absolute: 0.5 10*3/uL (ref 0.1–1.0)
Monocytes Relative: 9 % (ref 3.0–12.0)
Neutro Abs: 3.4 10*3/uL (ref 1.4–7.7)
Neutrophils Relative %: 62.6 % (ref 43.0–77.0)
Platelets: 280 10*3/uL (ref 150.0–400.0)
RBC: 3.73 Mil/uL — ABNORMAL LOW (ref 3.87–5.11)
RDW: 14.5 % (ref 11.5–15.5)
WBC: 5.5 10*3/uL (ref 4.0–10.5)

## 2021-04-13 LAB — BASIC METABOLIC PANEL
BUN: 24 mg/dL — ABNORMAL HIGH (ref 6–23)
CO2: 26 mEq/L (ref 19–32)
Calcium: 9.8 mg/dL (ref 8.4–10.5)
Chloride: 102 mEq/L (ref 96–112)
Creatinine, Ser: 0.99 mg/dL (ref 0.40–1.20)
GFR: 58.12 mL/min — ABNORMAL LOW (ref >60.00)
Glucose, Bld: 81 mg/dL (ref 70–99)
Potassium: 3.4 mEq/L — ABNORMAL LOW (ref 3.5–5.1)
Sodium: 140 mEq/L (ref 135–145)

## 2021-04-13 LAB — LIPID PANEL
Cholesterol: 153 mg/dL (ref 0–200)
HDL: 61.7 mg/dL (ref >39.00)
Total CHOL/HDL Ratio: 2
Triglycerides: 461 mg/dL — ABNORMAL HIGH (ref 0.0–149.0)

## 2021-04-13 LAB — URIC ACID: Uric Acid, Serum: 7.7 mg/dL — ABNORMAL HIGH (ref 2.4–7.0)

## 2021-04-13 LAB — HEPATIC FUNCTION PANEL
ALT: 14 U/L (ref 0–35)
AST: 28 U/L (ref 0–37)
Albumin: 4.1 g/dL (ref 3.5–5.2)
Alkaline Phosphatase: 104 U/L (ref 39–117)
Bilirubin, Direct: 0.1 mg/dL (ref 0.0–0.3)
Total Bilirubin: 0.8 mg/dL (ref 0.2–1.2)
Total Protein: 8 g/dL (ref 6.0–8.3)

## 2021-04-13 LAB — VITAMIN B12: Vitamin B-12: >1550 pg/mL — ABNORMAL HIGH (ref 211–911)

## 2021-04-13 LAB — VITAMIN D 25 HYDROXY (VIT D DEFICIENCY, FRACTURES): VITD: 57.97 ng/mL (ref 30.00–100.00)

## 2021-04-13 LAB — LDL CHOLESTEROL, DIRECT: Direct LDL: 45 mg/dL

## 2021-04-13 MED ORDER — INDOMETHACIN 50 MG PO CAPS: 50.0000 mg | ORAL_CAPSULE | Freq: Three times a day (TID) | ORAL | 2 refills | Status: DC | PRN

## 2021-04-13 NOTE — Patient Instructions (Signed)
Please take all new medication as prescribed - the indomethacin only as needed for a gout flare (asap when you think it is starting)  Please continue all other medications as before, and refills have been done if requested.  Please have the pharmacy call with any other refills you may need.  Please continue your efforts at being more active, low cholesterol diet  Please keep your appointments with your specialists as you may have planned  Please go to the LAB at the blood drawing area for the tests to be done  You will be contacted by phone if any changes need to be made immediately.  Otherwise, you will receive a letter about your results with an explanation, but please check with MyChart first.  Please remember to sign up for MyChart if you have not done so, as this will be important to you in the future with finding out test results, communicating by private email, and scheduling acute appointments online when needed.  Please make an Appointment to return in 6 months, or sooner if needed

## 2021-04-13 NOTE — Progress Notes (Signed)
Patient ID: Andrea [name], female   DOB: 05/11/1950, 70 y.o.   MRN: [id number]        Chief Complaint: follow up HTN, and hyperglycemia, recurring gout, copd, ckd       HPI:  Andrea [name] is a 70 y.o. female here with c/o intermitent acute mild to mod gouty symptoms to the feet for 2-3 months despite good compliance with allopurinol.  Pt denies chest pain, increased sob or doe, wheezing, orthopnea, PND, increased LE swelling, palpitations, dizziness or syncope.   Pt denies polydipsia, polyuria, or new focal neuro s/s.   Pt denies fever, wt loss, night sweats, loss of appetite, or other constitutional symptoms      Taking B12 and Vit D   No other new complaints       Wt Readings from Last 3 Encounters:  04/13/21 110 lb (49.9 kg)  10/12/20 109 lb 6.4 oz (49.6 kg)  04/10/20 106 lb (48.1 kg)   BP Readings from Last 3 Encounters:  04/13/21 122/80  10/12/20 122/86  04/10/20 120/90         Past Medical History:  Diagnosis Date   Allergic rhinitis, cause unspecified 08/07/2012   COPD 03/23/2008   Cough 11/05/2007   HYPERTENSION 02/10/2007   MUSCLE CRAMPS 08/09/2009   OSTEOPOROSIS 02/10/2007   Other and unspecified hyperlipidemia 08/07/2012   PEPTIC ULCER DISEASE 03/23/2008   PNEUMONIA, RIGHT UPPER LOBE 03/23/2008   RESTLESS LEG SYNDROME 03/23/2008   Unspecified disorder of thyroid 11/16/2007   Past Surgical History:  Procedure Laterality Date   Breast Biopsy negative  1972   BREAST LUMPECTOMY Right     reports that she has quit smoking. She has never used smokeless tobacco. She reports current alcohol use. She reports that she does not use drugs. family history includes Breast cancer (age of onset: 31) in her daughter; Leukemia in her father. Allergies  Allergen Reactions   Alendronate Sodium Nausea Only    GI upset   Zyrtec [Cetirizine] Itching, Rash and Other (See Comments)    Tongue burning, thrush   Sertraline Hcl Other (See Comments)    unknown   Current  Outpatient Medications on File Prior to Visit  Medication Sig Dispense Refill   ADVAIR DISKUS 250-50 MCG/ACT AEPB INHALE 1 PUFF INTO THE LUNGS 2 (TWO) TIMES DAILY AS NEEDED. 180 each 1   albuterol (VENTOLIN HFA) 108 (90 Base) MCG/ACT inhaler Inhale 2 puffs into the lungs every 6 (six) hours as needed for wheezing or shortness of breath. 36 g 3   allopurinol (ZYLOPRIM) 300 MG tablet Take 1 tablet (300 mg total) by mouth daily. 90 tablet 3   ALPRAZolam (XANAX) 0.25 MG tablet Take 1 tablet (0.25 mg total) by mouth at bedtime as needed for anxiety. 90 tablet 1   aspirin EC 81 MG tablet Take 1 tablet (81 mg total) by mouth daily. 30 tablet 5   atorvastatin (LIPITOR) 20 MG tablet Take 1 tablet (20 mg total) by mouth daily. 90 tablet 3   azelastine (ASTELIN) 0.1 % nasal spray azelastine 137 mcg (0.1 %) nasal spray aerosol  Spray 2 sprays twice a day by intranasal route.     Cholecalciferol (THERA-D 2000) 50 MCG (2000 UT) TABS 1 tab by mouth once daily 90 tablet 99   [facility]S VITAMIN B12 1000 MCG tablet TAKE 1 TABLET BY MOUTH EVERY DAY 90 tablet 1   ipratropium (ATROVENT) 0.03 % nasal spray ipratropium bromide 21 mcg (0.03 %) nasal spray  potassium chloride (KLOR-CON) 10 MEQ tablet Take 1 tablet (10 mEq total) by mouth daily. 90 tablet 3   predniSONE (DELTASONE) 10 MG tablet 3 tabs by mouth per day for 3 days,2tabs per day for 3 days,1tab per day for 3 days 18 tablet 0   tiZANidine (ZANAFLEX) 2 MG tablet Take 1 tablet (2 mg total) by mouth every 6 (six) hours as needed for muscle spasms. 40 tablet 1   triamterene-hydrochlorothiazide (MAXZIDE-25) 37.5-25 MG tablet TAKE 1 TABLET BY MOUTH EVERY DAY 90 tablet 3   escitalopram (LEXAPRO) 10 MG tablet Take 1 tablet (10 mg total) by mouth daily. 90 tablet 3   No current facility-administered medications on file prior to visit.        ROS:  All others reviewed and negative.  Objective        PE:  BP 122/80    Pulse (!) 105    Ht 5\' 6"  (1.676 m)    Wt 110 lb  (49.9 kg)    SpO2 96%    BMI 17.75 kg/m                 Constitutional: Pt appears in NAD               HENT: Head: NCAT.                Right Ear: External ear normal.                 Left Ear: External ear normal.                Eyes: . Pupils are equal, round, and reactive to light. Conjunctivae and EOM are normal               Nose: without d/c or deformity               Neck: Neck supple. Gross normal ROM               Cardiovascular: Normal rate and regular rhythm.                 Pulmonary/Chest: Effort normal and breath sounds without rales or wheezing.                Abd:  Soft, NT, ND, + BS, no organomegaly               Neurological: Pt is alert. At baseline orientation, motor grossly intact               Skin: Skin is warm. No rashes, no other new lesions, LE edema - none               Psychiatric: Pt behavior is normal without agitation   Micro: none  Cardiac tracings I have personally interpreted today:  none  Pertinent Radiological findings (summarize): none   Lab Results  Component Value Date   WBC 5.5 04/13/2021   HGB 13.2 04/13/2021   HCT 38.6 04/13/2021   PLT 280.0 04/13/2021   GLUCOSE 81 04/13/2021   CHOL 153 04/13/2021   TRIG (H) 04/13/2021    461.0 Triglyceride is over 400; calculations on Lipids are invalid.   HDL 61.70 04/13/2021   LDLDIRECT 45.0 04/13/2021   LDLCALC 114 (H) 04/10/2020   ALT 14 04/13/2021   AST 28 04/13/2021   NA 140 04/13/2021   K 3.4 (L) 04/13/2021   CL 102 04/13/2021   CREATININE 0.99 04/13/2021   BUN 24 (  H) 04/13/2021   CO2 26 04/13/2021   TSH 0.69 10/12/2020   INR 1.00 02/11/2014   HGBA1C 5.7 (H) 02/12/2014   Assessment/Plan:  Andrea [name] is a 70 y.o. Black or African American [2] female with  has a past medical history of Allergic rhinitis, cause unspecified (08/07/2012), COPD (03/23/2008), Cough (11/05/2007), HYPERTENSION (02/10/2007), MUSCLE CRAMPS (08/09/2009), OSTEOPOROSIS (02/10/2007), Other and unspecified  hyperlipidemia (08/07/2012), PEPTIC ULCER DISEASE (03/23/2008), PNEUMONIA, RIGHT UPPER LOBE (03/23/2008), RESTLESS LEG SYNDROME (03/23/2008), and Unspecified disorder of thyroid (11/16/2007).  Vitamin D deficiency Last vitamin D Lab Results  Component Value Date   VD25OH 57.97 04/13/2021   Stable, cont oral replacement   B12 deficiency Lab Results  Component Value Date   VITAMINB12 >1550 (H) 04/13/2021   Stable, cont oral replacement - b12 1000 mcg qd   COPD (chronic obstructive pulmonary disease) Stable overall, cont currrent inhaler prn,  to f/u any worsening symptoms or concerns  Essential hypertension BP Readings from Last 3 Encounters:  04/13/21 122/80  10/12/20 122/86  04/10/20 120/90   Stable, pt to continue medical treatment maxide   Hyperlipidemia Lab Results  Component Value Date   LDLCALC 114 (H) 04/10/2020   uncontrolled, pt to restart current statin lipitor    CKD (chronic kidney disease) Lab Results  Component Value Date   CREATININE 0.99 04/13/2021   Stable overall, cont to avoid nephrotoxins   Acute gouty arthritis With recurring feet joint pain and swelling - for indocin tid prn,  to f/u any worsening symptoms or concerns  Followup: Return in about 6 months (around 10/12/2021).  Oliver Barre, MD 04/16/2021 4:36 PM [facility] [place] [facility] - [facility]

## 2021-04-16 ENCOUNTER — Encounter: Payer: Self-pay | Admitting: Internal Medicine

## 2021-04-16 NOTE — Assessment & Plan Note (Signed)
With recurring feet joint pain and swelling - for indocin tid prn,  to f/u any worsening symptoms or concerns

## 2021-04-16 NOTE — Assessment & Plan Note (Signed)
Last vitamin D Lab Results  Component Value Date   VD25OH 57.97 04/13/2021   Stable, cont oral replacement

## 2021-04-16 NOTE — Assessment & Plan Note (Signed)
Lab Results  Component Value Date   LDLCALC 114 (H) 04/10/2020   uncontrolled, pt to restart current statin lipitor

## 2021-04-16 NOTE — Assessment & Plan Note (Signed)
BP Readings from Last 3 Encounters:  04/13/21 122/80  10/12/20 122/86  04/10/20 120/90   Stable, pt to continue medical treatment maxide

## 2021-04-16 NOTE — Assessment & Plan Note (Signed)
Lab Results  Component Value Date   VITAMINB12 >1550 (H) 04/13/2021   Stable, cont oral replacement - b12 1000 mcg qd

## 2021-04-16 NOTE — Assessment & Plan Note (Signed)
Stable overall, cont currrent inhaler prn,  to f/u any worsening symptoms or concerns

## 2021-04-16 NOTE — Assessment & Plan Note (Signed)
Lab Results  Component Value Date   CREATININE 0.99 04/13/2021   Stable overall, cont to avoid nephrotoxins

## 2021-05-10 ENCOUNTER — Other Ambulatory Visit: Payer: Self-pay | Admitting: Internal Medicine

## 2021-05-25 ENCOUNTER — Ambulatory Visit (INDEPENDENT_AMBULATORY_CARE_PROVIDER_SITE_OTHER): Payer: Medicare Other

## 2021-05-25 ENCOUNTER — Other Ambulatory Visit: Payer: Self-pay

## 2021-05-25 DIAGNOSIS — Z0000 Encounter for general adult medical examination without abnormal findings: Secondary | ICD-10-CM

## 2021-05-25 NOTE — Progress Notes (Signed)
I connected with Andrea [name] today by telephone and verified that I am speaking with the correct person using two identifiers. Location patient: home Location provider: work Persons participating in the virtual visit: patient, provider.   I discussed the limitations, risks, security and privacy concerns of performing an evaluation and management service by telephone and the availability of in person appointments. I also discussed with the patient that there may be a patient responsible charge related to this service. The patient expressed understanding and verbally consented to this telephonic visit.    Interactive audio and video telecommunications were attempted between this provider and patient, however failed, due to patient having technical difficulties OR patient did not have access to video capability.  We continued and completed visit with audio only.  Some vital signs may be absent or patient reported.   Time Spent with patient on telephone encounter: 40 minutes  Subjective:   Andrea [name] is a 71 y.o. female who presents for an Initial Medicare Annual Wellness Visit.  Review of Systems     Cardiac Risk Factors include: advanced age (>37men, >37 women);dyslipidemia;hypertension     Objective:    There were no vitals filed for this visit. There is no height or weight on file to calculate BMI.  Advanced Directives 05/25/2021 05/15/2018 07/26/2016 12/22/2015 02/11/2014  Does Patient Have a Medical Advance Directive? Yes No No No No  Type of Advance Directive Living will;Healthcare Power of Attorney - - - -  Does patient want to make changes to medical advance directive? No - Patient declined - - - -  Copy of [place] in Chart? No - copy requested - - - -  Would patient like information on creating a medical advance directive? - No - Patient declined - - No - patient declined information    Current Medications (verified) Outpatient Encounter  Medications as of 05/25/2021  Medication Sig   ADVAIR DISKUS 250-50 MCG/ACT AEPB INHALE 1 PUFF INTO THE LUNGS 2 (TWO) TIMES DAILY AS NEEDED.   albuterol (VENTOLIN HFA) 108 (90 Base) MCG/ACT inhaler Inhale 2 puffs into the lungs every 6 (six) hours as needed for wheezing or shortness of breath.   allopurinol (ZYLOPRIM) 300 MG tablet Take 1 tablet (300 mg total) by mouth daily.   ALPRAZolam (XANAX) 0.25 MG tablet TAKE 1 TABLET BY MOUTH AT BEDTIME AS NEEDED FOR ANXIETY.   aspirin EC 81 MG tablet Take 1 tablet (81 mg total) by mouth daily.   atorvastatin (LIPITOR) 20 MG tablet Take 1 tablet (20 mg total) by mouth daily.   azelastine (ASTELIN) 0.1 % nasal spray azelastine 137 mcg (0.1 %) nasal spray aerosol  Spray 2 sprays twice a day by intranasal route.   Cholecalciferol (THERA-D 2000) 50 MCG (2000 UT) TABS 1 tab by mouth once daily   [employer] VITAMIN B12 1000 MCG tablet TAKE 1 TABLET BY MOUTH EVERY DAY   escitalopram (LEXAPRO) 10 MG tablet Take 1 tablet (10 mg total) by mouth daily.   indomethacin (INDOCIN) 50 MG capsule Take 1 capsule (50 mg total) by mouth 3 (three) times daily as needed.   ipratropium (ATROVENT) 0.03 % nasal spray ipratropium bromide 21 mcg (0.03 %) nasal spray   potassium chloride (KLOR-CON) 10 MEQ tablet Take 1 tablet (10 mEq total) by mouth daily.   predniSONE (DELTASONE) 10 MG tablet 3 tabs by mouth per day for 3 days,2tabs per day for 3 days,1tab per day for 3 days   tiZANidine (ZANAFLEX) 2 MG  tablet Take 1 tablet (2 mg total) by mouth every 6 (six) hours as needed for muscle spasms.   triamterene-hydrochlorothiazide (MAXZIDE-25) 37.5-25 MG tablet TAKE 1 TABLET BY MOUTH EVERY DAY   No facility-administered encounter medications on file as of 05/25/2021.    Allergies (verified) Alendronate sodium, Zyrtec [cetirizine], and Sertraline hcl   History: Past Medical History:  Diagnosis Date   Allergic rhinitis, cause unspecified 08/07/2012   COPD 03/23/2008   Cough 11/05/2007    HYPERTENSION 02/10/2007   MUSCLE CRAMPS 08/09/2009   OSTEOPOROSIS 02/10/2007   Other and unspecified hyperlipidemia 08/07/2012   PEPTIC ULCER DISEASE 03/23/2008   PNEUMONIA, RIGHT UPPER LOBE 03/23/2008   RESTLESS LEG SYNDROME 03/23/2008   Unspecified disorder of thyroid 11/16/2007   Past Surgical History:  Procedure Laterality Date   Breast Biopsy negative  1972   BREAST LUMPECTOMY Right    Family History  Problem Relation Age of Onset   Leukemia Father    Breast cancer Daughter 27   Social History   Socioeconomic History   Marital status: Married    Spouse name: Not on file   Number of children: Not on file   Years of education: Not on file   Highest education level: Not on file  Occupational History   Occupation: environmental services  Tobacco Use   Smoking status: Former   Smokeless tobacco: Never   Tobacco comments:    since 2009  Vaping Use   Vaping Use: Never used  Substance and Sexual Activity   Alcohol use: Yes   Drug use: No   Sexual activity: Yes  Other Topics Concern   Not on file  Social History Narrative   Not on file   Social Determinants of Health   Financial Resource Strain: Low Risk    Difficulty of Paying Living Expenses: Not hard at all  Food Insecurity: No Food Insecurity   Worried About Charity fundraiser in the Last Year: Never true   Arboriculturist in the Last Year: Never true  Transportation Needs: No Transportation Needs   Lack of Transportation (Medical): No   Lack of Transportation (Non-Medical): No  Physical Activity: Sufficiently Active   Days of Exercise per Week: 5 days   Minutes of Exercise per Session: 30 min  Stress: No Stress Concern Present   Feeling of Stress : Not at all  Social Connections: Socially Integrated   Frequency of Communication with Friends and Family: Three times a week   Frequency of Social Gatherings with Friends and Family: More than three times a week   Attends Religious Services: More than 4 times per  year   Active Member of [employer] or Organizations: Yes   Attends Music therapist: More than 4 times per year   Marital Status: Married    Tobacco Counseling Counseling given: Not Answered Tobacco comments: since 2009   Clinical Intake:  Pre-visit preparation completed: No  Pain : No/denies pain     Nutritional Risks: None Diabetes: No  How often do you need to have someone help you when you read instructions, pamphlets, or other written materials from your doctor or pharmacy?: 1 - Never What is the last grade level you completed in school?: GED  Diabetic? no  Interpreter Needed?: No  Information entered by :: Lisette Abu, LPN   Activities of Daily Living In your present state of health, do you have any difficulty performing the following activities: 05/25/2021 04/13/2021  Hearing? N N  Vision? N N  Difficulty concentrating or making decisions? N N  Walking or climbing stairs? N N  Dressing or bathing? N N  Doing errands, shopping? N N  Preparing Food and eating ? N -  Using the Toilet? N -  In the past six months, have you accidently leaked urine? N -  Do you have problems with loss of bowel control? N -  Managing your Medications? N -  Managing your Finances? N -  Housekeeping or managing your Housekeeping? N -  Some recent data might be hidden    Patient Care Team: Biagio Borg, MD as PCP - General  Indicate any recent Medical Services you may have received from other than [facility] providers in the past year (date may be approximate).     Assessment:   This is a routine wellness examination for [place].  Hearing/Vision screen Hearing Screening - Comments:: Patient denied any hearing difficulty.   No hearing aids.  Vision Screening - Comments:: Patient wears corrective glasses/contacts.  Eye exam done annually by:  My Dorien Chihuahua, OD. at [place]   Dietary issues and exercise activities discussed: Current Exercise Habits: Home  exercise routine, Type of exercise: walking, Time (Minutes): 30, Frequency (Times/Week): 5, Weekly Exercise (Minutes/Week): 150, Intensity: Moderate, Exercise limited by: respiratory conditions(s)   Goals Addressed               This Visit's Progress     Patient Stated (pt-stated)        Continue walking for physical exercise.      Depression Screen PHQ 2/9 Scores 05/25/2021 10/12/2020 04/10/2020 07/01/2019 06/25/2018 02/26/2017 02/23/2016  PHQ - 2 Score 0 1 0 1 0 0 0    Fall Risk Fall Risk  05/25/2021 10/12/2020 10/12/2020 07/01/2019 06/25/2018  Falls in the past year? 0 0 0 0 1  Number falls in past yr: 0 0 0 - 0  Injury with Fall? 0 - 0 - -  Risk for fall due to : No Fall Risks - - - -  Follow up Falls evaluation completed - - - -    FALL RISK PREVENTION PERTAINING TO THE HOME:  Any stairs in or around the home? Yes  If so, are there any without handrails? No  Home free of loose throw rugs in walkways, pet beds, electrical cords, etc? Yes  Adequate lighting in your home to reduce risk of falls? Yes   ASSISTIVE DEVICES UTILIZED TO PREVENT FALLS:  Life alert? No  Use of a cane, walker or w/c? No  Grab bars in the bathroom? Yes  Shower chair or bench in shower? Yes  Elevated toilet seat or a handicapped toilet? Yes   TIMED UP AND GO:  Was the test performed? No .  Length of time to ambulate 10 feet: n/a sec.   Gait steady and fast without use of assistive device (per patient)  Cognitive Function: Normal cognitive status assessed by direct observation by this Nurse Health Advisor. No abnormalities found.          Immunizations Immunization History  Administered Date(s) Administered   Fluad Quad(high Dose 65+) 12/16/2018   Hepatitis B, adult 04/05/2014, 12/07/2014   Influenza Whole 01/20/2009   Influenza, High Dose Seasonal PF 01/25/2021   Influenza,inj,Quad PF,6+ Mos 02/24/2014   Influenza-Unspecified 01/20/2017, 02/22/2020   PFIZER Comirnaty(Gray Top)Covid-19  Tri-Sucrose Vaccine 07/26/2020   PFIZER(Purple Top)SARS-COV-2 Vaccination 05/13/2019, 06/02/2019, 01/21/2020   Pneumococcal Conjugate-13 04/09/2017   Pneumococcal Polysaccharide-23 08/09/2009, 06/25/2018   Tdap 05/15/2018  TDAP status: Up to date  Flu Vaccine status: Up to date  Pneumococcal vaccine status: Up to date  Covid-19 vaccine status: Completed vaccines  Qualifies for Shingles Vaccine? Yes   Zostavax completed No   Shingrix Completed?: No.    Education has been provided regarding the importance of this vaccine. Patient has been advised to call insurance company to determine out of pocket expense if they have not yet received this vaccine. Advised may also receive vaccine at local pharmacy or Health Dept. Verbalized acceptance and understanding.  Screening Tests Health Maintenance  Topic Date Due   COVID-19 Vaccine (5 - Booster for Pfizer series) 09/20/2020   Zoster Vaccines- Shingrix (1 of 2) 07/12/2021 (Originally 08/15/2001)   DEXA SCAN  10/12/2021 (Originally 08/15/2016)   Fecal DNA (Cologuard)  07/19/2022   MAMMOGRAM  08/03/2022   TETANUS/TDAP  05/15/2028   Pneumonia Vaccine 67+ Years old  Completed   INFLUENZA VACCINE  Completed   Hepatitis C Screening  Completed   HPV VACCINES  Aged Out   COLONOSCOPY (Pts 45-1yrs Insurance coverage will need to be confirmed)  Discontinued    Health Maintenance  Health Maintenance Due  Topic Date Due   COVID-19 Vaccine (5 - Booster for [place] series) 09/20/2020    Colorectal cancer screening: Type of screening: Cologuard. Completed 07/19/2019. Repeat every 3 years  Mammogram status: Completed 08/02/2020. Repeat every year  Bone density status: never done  Lung Cancer Screening: (Low Dose CT Chest recommended if Age 36-80 years, 30 pack-year currently smoking OR have quit w/in 15years.) does not qualify.   Lung Cancer Screening Referral: no  Additional Screening:  Hepatitis C Screening: does qualify; Completed  yes  Vision Screening: Recommended annual ophthalmology exams for early detection of glaucoma and other disorders of the eye. Is the patient up to date with their annual eye exam?  Yes  Who is the provider or what is the name of the office in which the patient attends annual eye exams?  My Dorien Ch[facility]uahua[facility] OD. at [facility]  If pt is not established with a provider, would they like to be referred to a provider to establish care? No .   Dental Screening: Recommended annual dental exams for proper oral hygiene  Community Resource Referral / Chronic Care Management: CRR required this visit?  No   CCM required this visit?  No      Plan:     I have personally reviewed and noted the following in the patients chart:   Medical and social history Use of alcohol, tobacco or illicit drugs  Current medications and supplements including opioid prescriptions. Patient is not currently taking opioid prescriptions. Functional ability and status Nutritional status Physical activity Advanced directives List of other physicians Hospitalizations, surgeries, and ER visits in previous 12 months Vitals Screenings to include cognitive, depression, and falls Referrals and appointments  In addition, I have reviewed and discussed with patient certain preventive protocols, quality metrics, and best practice recommendations. A written personalized care plan for preventive services as well as general preventive health recommendations were provided to patient.     Sheral Flow, LPN   [id number]   Nurse Notes:  Patient is cogitatively intact. There were no vitals filed for this visit. There is no height or weight on file to calculate BMI. Patient stated that she has no issues with gait or balance; does not use any assistive devices. Medications reviewed with patient; no opioid use noted.

## 2021-05-25 NOTE — Patient Instructions (Signed)
Andrea [name] , Thank you for taking time to come for your Medicare Wellness Visit. I appreciate your ongoing commitment to your health goals. Please review the following plan we discussed and let me know if I can assist you in the future.   Screening recommendations/referrals: Cologuard: 07/19/2019; due every 3 years (due 07/19/2022) Mammogram: 08/02/2020; due every 1-2 years (due 08/08/2022) Bone Density: never done Recommended yearly ophthalmology/optometry visit for glaucoma screening and checkup Recommended yearly dental visit for hygiene and checkup  Vaccinations: Influenza vaccine: 01/25/2021 Pneumococcal vaccine: 04/09/2017, 06/25/2018 Tdap vaccine: 05/15/2018; due every 10 years (due 05/15/2028) Shingles vaccine: never done; can check with local pharmacy for vaccine and cost)   Covid-19: 05/11/2019, 06/02/2019, 02/03/2020, 07/26/2020, 03/23/2021  Advanced directives: Please bring a copy of your health care power of attorney and living will to the office at your convenience.  Conditions/risks identified: Yes; Client understands the importance of follow-up with providers by attending scheduled visits and discussed goals to eat healthier, increase physical activity, exercise the brain, socialize more, get enough sleep and make time for laughter.  Next appointment: Please schedule your next Medicare Wellness Visit with your Nurse Health Advisor in 1 year by calling [phone].   Preventive Care 31 Years and Older, Female Preventive care refers to lifestyle choices and visits with your health care provider that can promote health and wellness. What does preventive care include? A yearly physical exam. This is also called an annual well check. Dental exams once or twice a year. Routine eye exams. Ask your health care provider how often you should have your eyes checked. Personal lifestyle choices, including: Daily care of your teeth and gums. Regular physical activity. Eating a healthy  diet. Avoiding tobacco and drug use. Limiting alcohol use. Practicing safe sex. Taking low-dose aspirin every day. Taking vitamin and mineral supplements as recommended by your health care provider. What happens during an annual well check? The services and screenings done by your health care provider during your annual well check will depend on your age, overall health, lifestyle risk factors, and family history of disease. Counseling  Your health care provider may ask you questions about your: Alcohol use. Tobacco use. Drug use. Emotional well-being. Home and relationship well-being. Sexual activity. Eating habits. History of falls. Memory and ability to understand (cognition). Work and work Astronomer. Reproductive health. Screening  You may have the following tests or measurements: Height, weight, and BMI. Blood pressure. Lipid and cholesterol levels. These may be checked every 5 years, or more frequently if you are over 70 years old. Skin check. Lung cancer screening. You may have this screening every year starting at age 70 if you have a 30-pack-year history of smoking and currently smoke or have quit within the past 15 years. Fecal occult blood test (FOBT) of the stool. You may have this test every year starting at age 70. Flexible sigmoidoscopy or colonoscopy. You may have a sigmoidoscopy every 5 years or a colonoscopy every 10 years starting at age 70. Hepatitis C blood test. Hepatitis B blood test. Sexually transmitted disease (STD) testing. Diabetes screening. This is done by checking your blood sugar (glucose) after you have not eaten for a while (fasting). You may have this done every 1-3 years. Bone density scan. This is done to screen for osteoporosis. You may have this done starting at age 70. Mammogram. This may be done every 1-2 years. Talk to your health care provider about how often you should have regular mammograms. Talk with your health care  provider about  your test results, treatment options, and if necessary, the need for more tests. Vaccines  Your health care provider may recommend certain vaccines, such as: Influenza vaccine. This is recommended every year. Tetanus, diphtheria, and acellular pertussis (Tdap, Td) vaccine. You may need a Td booster every 10 years. Zoster vaccine. You may need this after age 70. Pneumococcal 13-valent conjugate (PCV13) vaccine. One dose is recommended after age 70. Pneumococcal polysaccharide (PPSV23) vaccine. One dose is recommended after age 70. Talk to your health care provider about which screenings and vaccines you need and how often you need them. This information is not intended to replace advice given to you by your health care provider. Make sure you discuss any questions you have with your health care provider. Document Released: 05/05/2015 Document Revised: 12/27/2015 Document Reviewed: 02/07/2015 Elsevier Interactive Patient Education  2017 [employer]  Fall Prevention in the Home Falls can cause injuries. They can happen to people of all ages. There are many things you can do to make your home safe and to help prevent falls. What can I do on the outside of my home? Regularly fix the edges of walkways and driveways and fix any cracks. Remove anything that might make you trip as you walk through a door, such as a raised step or threshold. Trim any bushes or trees on the path to your home. Use bright outdoor lighting. Clear any walking paths of anything that might make someone trip, such as rocks or tools. Regularly check to see if handrails are loose or broken. Make sure that both sides of any steps have handrails. Any raised decks and porches should have guardrails on the edges. Have any leaves, snow, or ice cleared regularly. Use sand or salt on walking paths during winter. Clean up any spills in your garage right away. This includes oil or grease spills. What can I do in the bathroom? Use  night lights. Install grab bars by the toilet and in the tub and shower. Do not use towel bars as grab bars. Use non-skid mats or decals in the tub or shower. If you need to sit down in the shower, use a plastic, non-slip stool. Keep the floor dry. Clean up any water that spills on the floor as soon as it happens. Remove soap buildup in the tub or shower regularly. Attach bath mats securely with double-sided non-slip rug tape. Do not have throw rugs and other things on the floor that can make you trip. What can I do in the bedroom? Use night lights. Make sure that you have a light by your bed that is easy to reach. Do not use any sheets or blankets that are too big for your bed. They should not hang down onto the floor. Have a firm chair that has side arms. You can use this for support while you get dressed. Do not have throw rugs and other things on the floor that can make you trip. What can I do in the kitchen? Clean up any spills right away. Avoid walking on wet floors. Keep items that you use a lot in easy-to-reach places. If you need to reach something above you, use a strong step stool that has a grab bar. Keep electrical cords out of the way. Do not use floor polish or wax that makes floors slippery. If you must use wax, use non-skid floor wax. Do not have throw rugs and other things on the floor that can make you trip. What can I  do with my stairs? Do not leave any items on the stairs. Make sure that there are handrails on both sides of the stairs and use them. Fix handrails that are broken or loose. Make sure that handrails are as long as the stairways. Check any carpeting to make sure that it is firmly attached to the stairs. Fix any carpet that is loose or worn. Avoid having throw rugs at the top or bottom of the stairs. If you do have throw rugs, attach them to the floor with carpet tape. Make sure that you have a light switch at the top of the stairs and the bottom of the  stairs. If you do not have them, ask someone to add them for you. What else can I do to help prevent falls? Wear shoes that: Do not have high heels. Have rubber bottoms. Are comfortable and fit you well. Are closed at the toe. Do not wear sandals. If you use a stepladder: Make sure that it is fully opened. Do not climb a closed stepladder. Make sure that both sides of the stepladder are locked into place. Ask someone to hold it for you, if possible. Clearly mark and make sure that you can see: Any grab bars or handrails. First and last steps. Where the edge of each step is. Use tools that help you move around (mobility aids) if they are needed. These include: Canes. Walkers. Scooters. Crutches. Turn on the lights when you go into a dark area. Replace any light bulbs as soon as they burn out. Set up your furniture so you have a clear path. Avoid moving your furniture around. If any of your floors are uneven, fix them. If there are any pets around you, be aware of where they are. Review your medicines with your doctor. Some medicines can make you feel dizzy. This can increase your chance of falling. Ask your doctor what other things that you can do to help prevent falls. This information is not intended to replace advice given to you by your health care provider. Make sure you discuss any questions you have with your health care provider. Document Released: 02/02/2009 Document Revised: 09/14/2015 Document Reviewed: 05/13/2014 Elsevier Interactive Patient Education  2017 [employer].

## 2021-06-18 DIAGNOSIS — Z20822 Contact with and (suspected) exposure to covid-19: Secondary | ICD-10-CM | POA: Diagnosis not present

## 2021-06-18 DIAGNOSIS — Z03818 Encounter for observation for suspected exposure to other biological agents ruled out: Secondary | ICD-10-CM | POA: Diagnosis not present

## 2021-06-19 ENCOUNTER — Other Ambulatory Visit: Payer: Self-pay

## 2021-06-19 ENCOUNTER — Encounter (HOSPITAL_COMMUNITY): Payer: Self-pay

## 2021-06-19 ENCOUNTER — Emergency Department (HOSPITAL_COMMUNITY): Payer: Medicare Other

## 2021-06-19 ENCOUNTER — Inpatient Hospital Stay (HOSPITAL_COMMUNITY)
Admission: EM | Admit: 2021-06-19 | Discharge: 2021-06-21 | DRG: 190 | Disposition: A | Discharge status: Home / Self Care | Payer: Medicare Other | Attending: Internal Medicine | Admitting: Internal Medicine

## 2021-06-19 ENCOUNTER — Ambulatory Visit (HOSPITAL_COMMUNITY)
Admission: EM | Admit: 2021-06-19 | Discharge: 2021-06-19 | Payer: Medicare Other | Attending: Family Medicine | Admitting: Family Medicine

## 2021-06-19 ENCOUNTER — Encounter (HOSPITAL_COMMUNITY): Payer: Self-pay | Admitting: Emergency Medicine

## 2021-06-19 DIAGNOSIS — I10 Essential (primary) hypertension: Secondary | ICD-10-CM | POA: Diagnosis present

## 2021-06-19 DIAGNOSIS — Z789 Other specified health status: Secondary | ICD-10-CM

## 2021-06-19 DIAGNOSIS — R7401 Elevation of levels of liver transaminase levels: Secondary | ICD-10-CM | POA: Diagnosis not present

## 2021-06-19 DIAGNOSIS — Z87891 Personal history of nicotine dependence: Secondary | ICD-10-CM

## 2021-06-19 DIAGNOSIS — J9601 Acute respiratory failure with hypoxia: Secondary | ICD-10-CM | POA: Diagnosis not present

## 2021-06-19 DIAGNOSIS — E785 Hyperlipidemia, unspecified: Secondary | ICD-10-CM | POA: Diagnosis not present

## 2021-06-19 DIAGNOSIS — Z888 Allergy status to other drugs, medicaments and biological substances status: Secondary | ICD-10-CM

## 2021-06-19 DIAGNOSIS — E7800 Pure hypercholesterolemia, unspecified: Secondary | ICD-10-CM | POA: Diagnosis not present

## 2021-06-19 DIAGNOSIS — J441 Chronic obstructive pulmonary disease with (acute) exacerbation: Secondary | ICD-10-CM | POA: Diagnosis not present

## 2021-06-19 DIAGNOSIS — Z79899 Other long term (current) drug therapy: Secondary | ICD-10-CM | POA: Diagnosis not present

## 2021-06-19 DIAGNOSIS — R0602 Shortness of breath: Secondary | ICD-10-CM | POA: Diagnosis not present

## 2021-06-19 DIAGNOSIS — Z7982 Long term (current) use of aspirin: Secondary | ICD-10-CM

## 2021-06-19 DIAGNOSIS — I739 Peripheral vascular disease, unspecified: Secondary | ICD-10-CM | POA: Diagnosis not present

## 2021-06-19 DIAGNOSIS — Z806 Family history of leukemia: Secondary | ICD-10-CM

## 2021-06-19 DIAGNOSIS — R7989 Other specified abnormal findings of blood chemistry: Secondary | ICD-10-CM | POA: Diagnosis present

## 2021-06-19 DIAGNOSIS — R000 Tachycardia, unspecified: Secondary | ICD-10-CM | POA: Diagnosis not present

## 2021-06-19 DIAGNOSIS — F1090 Alcohol use, unspecified, uncomplicated: Secondary | ICD-10-CM | POA: Diagnosis not present

## 2021-06-19 DIAGNOSIS — Z8711 Personal history of peptic ulcer disease: Secondary | ICD-10-CM | POA: Diagnosis not present

## 2021-06-19 DIAGNOSIS — M810 Age-related osteoporosis without current pathological fracture: Secondary | ICD-10-CM | POA: Diagnosis present

## 2021-06-19 DIAGNOSIS — J449 Chronic obstructive pulmonary disease, unspecified: Secondary | ICD-10-CM | POA: Diagnosis not present

## 2021-06-19 DIAGNOSIS — Z8673 Personal history of transient ischemic attack (TIA), and cerebral infarction without residual deficits: Secondary | ICD-10-CM | POA: Diagnosis not present

## 2021-06-19 DIAGNOSIS — G2581 Restless legs syndrome: Secondary | ICD-10-CM | POA: Diagnosis present

## 2021-06-19 DIAGNOSIS — E876 Hypokalemia: Secondary | ICD-10-CM | POA: Diagnosis not present

## 2021-06-19 LAB — CBC WITH DIFFERENTIAL/PLATELET
Abs Immature Granulocytes: 0.11 10*3/uL — ABNORMAL HIGH (ref 0.00–0.07)
Basophils Absolute: 0.1 10*3/uL (ref 0.0–0.1)
Basophils Relative: 1 %
Eosinophils Absolute: 0.7 10*3/uL — ABNORMAL HIGH (ref 0.0–0.5)
Eosinophils Relative: 8 %
HCT: 39.5 % (ref 36.0–46.0)
Hemoglobin: 13.5 g/dL (ref 12.0–15.0)
Immature Granulocytes: 1 %
Lymphocytes Relative: 12 %
Lymphs Abs: 1 10*3/uL (ref 0.7–4.0)
MCH: 35.3 pg — ABNORMAL HIGH (ref 26.0–34.0)
MCHC: 34.2 g/dL (ref 30.0–36.0)
MCV: 103.4 fL — ABNORMAL HIGH (ref 80.0–100.0)
Monocytes Absolute: 0.6 10*3/uL (ref 0.1–1.0)
Monocytes Relative: 7 %
Neutro Abs: 6 10*3/uL (ref 1.7–7.7)
Neutrophils Relative %: 71 %
Platelets: 260 10*3/uL (ref 150–400)
RBC: 3.82 MIL/uL — ABNORMAL LOW (ref 3.87–5.11)
RDW: 13.5 % (ref 11.5–15.5)
WBC: 8.5 10*3/uL (ref 4.0–10.5)
nRBC: 0 % (ref 0.0–0.2)

## 2021-06-19 LAB — I-STAT VENOUS BLOOD GAS, ED
Acid-base deficit: 4 mmol/L — ABNORMAL HIGH (ref 0.0–2.0)
Bicarbonate: 20.5 mmol/L (ref 20.0–28.0)
Calcium, Ion: 1.19 mmol/L (ref 1.15–1.40)
HCT: 35 % — ABNORMAL LOW (ref 36.0–46.0)
Hemoglobin: 11.9 g/dL — ABNORMAL LOW (ref 12.0–15.0)
O2 Saturation: 100 %
Potassium: 4 mmol/L (ref 3.5–5.1)
Sodium: 135 mmol/L (ref 135–145)
TCO2: 22 mmol/L (ref 22–32)
pCO2, Ven: 33.5 mmHg — ABNORMAL LOW (ref 44–60)
pH, Ven: 7.395 (ref 7.25–7.43)
pO2, Ven: 178 mmHg — ABNORMAL HIGH (ref 32–45)

## 2021-06-19 LAB — COMPREHENSIVE METABOLIC PANEL
ALT: 21 U/L (ref 0–44)
AST: 50 U/L — ABNORMAL HIGH (ref 15–41)
Albumin: 3.9 g/dL (ref 3.5–5.0)
Alkaline Phosphatase: 133 U/L — ABNORMAL HIGH (ref 38–126)
Anion gap: 15 (ref 5–15)
BUN: 20 mg/dL (ref 8–23)
CO2: 18 mmol/L — ABNORMAL LOW (ref 22–32)
Calcium: 9.3 mg/dL (ref 8.9–10.3)
Chloride: 104 mmol/L (ref 98–111)
Creatinine, Ser: 1.15 mg/dL — ABNORMAL HIGH (ref 0.44–1.00)
GFR, Estimated: 52 mL/min — ABNORMAL LOW (ref >60)
Glucose, Bld: 94 mg/dL (ref 70–99)
Potassium: 2.9 mmol/L — ABNORMAL LOW (ref 3.5–5.1)
Sodium: 137 mmol/L (ref 135–145)
Total Bilirubin: 1 mg/dL (ref 0.3–1.2)
Total Protein: 8.2 g/dL — ABNORMAL HIGH (ref 6.5–8.1)

## 2021-06-19 LAB — HEPATITIS PANEL, ACUTE
HCV Ab: NONREACTIVE
Hep A IgM: NONREACTIVE
Hep B C IgM: NONREACTIVE
Hepatitis B Surface Ag: NONREACTIVE

## 2021-06-19 LAB — HIV ANTIBODY (ROUTINE TESTING W REFLEX): HIV Screen 4th Generation wRfx: NONREACTIVE

## 2021-06-19 MED ORDER — ALLOPURINOL 300 MG PO TABS
300.0000 mg | ORAL_TABLET | Freq: Every day | ORAL | Status: DC
  Administered 2021-06-19 – 2021-06-21 (×3): 300 mg via ORAL
  Filled 2021-06-19 (×4): qty 1

## 2021-06-19 MED ORDER — METHYLPREDNISOLONE SODIUM SUCC 125 MG IJ SOLR
125.0000 mg | Freq: Once | INTRAMUSCULAR | Status: AC
  Administered 2021-06-19: 125 mg via INTRAVENOUS
  Filled 2021-06-19: qty 2

## 2021-06-19 MED ORDER — ALBUTEROL SULFATE (2.5 MG/3ML) 0.083% IN NEBU
2.5000 mg | INHALATION_SOLUTION | RESPIRATORY_TRACT | Status: DC
  Administered 2021-06-19: 2.5 mg via RESPIRATORY_TRACT

## 2021-06-19 MED ORDER — IPRATROPIUM BROMIDE 0.02 % IN SOLN: 0.5000 mg | Freq: Four times a day (QID) | RESPIRATORY_TRACT | Status: DC

## 2021-06-19 MED ORDER — ALBUTEROL (5 MG/ML) CONTINUOUS INHALATION SOLN
10.0000 mg/h | INHALATION_SOLUTION | RESPIRATORY_TRACT | Status: DC
  Administered 2021-06-19: 10 mg/h via RESPIRATORY_TRACT

## 2021-06-19 MED ORDER — POTASSIUM CHLORIDE 20 MEQ PO PACK
40.0000 meq | PACK | Freq: Every day | ORAL | Status: DC
  Administered 2021-06-19: 40 meq via ORAL
  Filled 2021-06-19: qty 2

## 2021-06-19 MED ORDER — ALBUTEROL SULFATE (2.5 MG/3ML) 0.083% IN NEBU
INHALATION_SOLUTION | RESPIRATORY_TRACT | Status: AC
  Filled 2021-06-19: qty 3

## 2021-06-19 MED ORDER — ACETAMINOPHEN 650 MG RE SUPP: 650.0000 mg | Freq: Four times a day (QID) | RECTAL | Status: DC | PRN

## 2021-06-19 MED ORDER — ENOXAPARIN SODIUM 40 MG/0.4ML IJ SOSY
40.0000 mg | PREFILLED_SYRINGE | INTRAMUSCULAR | Status: DC
  Administered 2021-06-19: 40 mg via SUBCUTANEOUS
  Filled 2021-06-19: qty 0.4

## 2021-06-19 MED ORDER — AZITHROMYCIN 500 MG PO TABS
500.0000 mg | ORAL_TABLET | Freq: Every day | ORAL | Status: DC
  Administered 2021-06-20 – 2021-06-21 (×2): 500 mg via ORAL
  Filled 2021-06-19 (×2): qty 1

## 2021-06-19 MED ORDER — IPRATROPIUM-ALBUTEROL 0.5-2.5 (3) MG/3ML IN SOLN
3.0000 mL | Freq: Four times a day (QID) | RESPIRATORY_TRACT | Status: DC
Start: 2021-06-19 — End: 2021-06-19
  Administered 2021-06-19: 3 mL via RESPIRATORY_TRACT

## 2021-06-19 MED ORDER — LORAZEPAM 2 MG/ML IJ SOLN
0.0000 mg | Freq: Four times a day (QID) | INTRAMUSCULAR | Status: DC
  Administered 2021-06-21: 2 mg via INTRAVENOUS
  Administered 2021-06-21: 1 mg via INTRAVENOUS
  Filled 2021-06-19 (×2): qty 1

## 2021-06-19 MED ORDER — POTASSIUM CHLORIDE CRYS ER 20 MEQ PO TBCR
20.0000 meq | EXTENDED_RELEASE_TABLET | ORAL | Status: AC
  Administered 2021-06-19: 20 meq via ORAL
  Filled 2021-06-19: qty 1

## 2021-06-19 MED ORDER — KETOROLAC TROMETHAMINE 15 MG/ML IJ SOLN
15.0000 mg | Freq: Once | INTRAMUSCULAR | Status: AC
  Administered 2021-06-19: 15 mg via INTRAVENOUS
  Filled 2021-06-19: qty 1

## 2021-06-19 MED ORDER — IPRATROPIUM BROMIDE 0.02 % IN SOLN
2.0000 mg | Freq: Once | RESPIRATORY_TRACT | Status: AC
  Administered 2021-06-19: 2 mg via RESPIRATORY_TRACT
  Filled 2021-06-19: qty 10

## 2021-06-19 MED ORDER — LORAZEPAM 1 MG PO TABS
1.0000 mg | ORAL_TABLET | ORAL | Status: DC | PRN
  Administered 2021-06-20: 1 mg via ORAL
  Filled 2021-06-19: qty 1

## 2021-06-19 MED ORDER — GUAIFENESIN ER 600 MG PO TB12
600.0000 mg | ORAL_TABLET | Freq: Two times a day (BID) | ORAL | Status: DC
  Administered 2021-06-19 – 2021-06-21 (×4): 600 mg via ORAL
  Filled 2021-06-19 (×4): qty 1

## 2021-06-19 MED ORDER — ONDANSETRON HCL 4 MG PO TABS: 4.0000 mg | ORAL_TABLET | Freq: Four times a day (QID) | ORAL | Status: DC | PRN

## 2021-06-19 MED ORDER — MAGNESIUM SULFATE IN D5W 1-5 GM/100ML-% IV SOLN
1.0000 g | Freq: Once | INTRAVENOUS | Status: AC
  Administered 2021-06-19: 1 g via INTRAVENOUS
  Filled 2021-06-19: qty 100

## 2021-06-19 MED ORDER — LEVALBUTEROL HCL 0.63 MG/3ML IN NEBU
0.6300 mg | INHALATION_SOLUTION | Freq: Four times a day (QID) | RESPIRATORY_TRACT | Status: DC
  Administered 2021-06-19 – 2021-06-20 (×3): 0.63 mg via RESPIRATORY_TRACT
  Filled 2021-06-19 (×3): qty 3

## 2021-06-19 MED ORDER — ACETAMINOPHEN 325 MG PO TABS: 650.0000 mg | ORAL_TABLET | Freq: Four times a day (QID) | ORAL | Status: DC | PRN

## 2021-06-19 MED ORDER — POTASSIUM CHLORIDE CRYS ER 20 MEQ PO TBCR: 40.0000 meq | EXTENDED_RELEASE_TABLET | ORAL | Status: DC

## 2021-06-19 MED ORDER — IPRATROPIUM-ALBUTEROL 0.5-2.5 (3) MG/3ML IN SOLN
6.0000 mL | Freq: Once | RESPIRATORY_TRACT | Status: DC
Start: 2021-06-19 — End: 2021-06-19

## 2021-06-19 MED ORDER — ALBUTEROL SULFATE (2.5 MG/3ML) 0.083% IN NEBU: 10.0000 mg/h | INHALATION_SOLUTION | RESPIRATORY_TRACT | Status: DC

## 2021-06-19 MED ORDER — ADULT MULTIVITAMIN W/MINERALS CH
1.0000 | ORAL_TABLET | Freq: Every day | ORAL | Status: DC
  Administered 2021-06-19 – 2021-06-21 (×3): 1 via ORAL
  Filled 2021-06-19 (×3): qty 1

## 2021-06-19 MED ORDER — SODIUM CHLORIDE 0.9 % IV SOLN
500.0000 mg | INTRAVENOUS | Status: AC
  Administered 2021-06-19: 500 mg via INTRAVENOUS
  Filled 2021-06-19: qty 5

## 2021-06-19 MED ORDER — IPRATROPIUM-ALBUTEROL 0.5-2.5 (3) MG/3ML IN SOLN: 9.0000 mL | Freq: Once | RESPIRATORY_TRACT | Status: DC

## 2021-06-19 MED ORDER — ATORVASTATIN CALCIUM 10 MG PO TABS
20.0000 mg | ORAL_TABLET | Freq: Every day | ORAL | Status: DC
  Administered 2021-06-19 – 2021-06-21 (×3): 20 mg via ORAL
  Filled 2021-06-19 (×3): qty 2

## 2021-06-19 MED ORDER — ASPIRIN EC 81 MG PO TBEC
81.0000 mg | DELAYED_RELEASE_TABLET | Freq: Every day | ORAL | Status: DC
  Administered 2021-06-19 – 2021-06-21 (×3): 81 mg via ORAL
  Filled 2021-06-19 (×3): qty 1

## 2021-06-19 MED ORDER — ALPRAZOLAM 0.25 MG PO TABS: 0.2500 mg | ORAL_TABLET | Freq: Every evening | ORAL | Status: DC | PRN

## 2021-06-19 MED ORDER — SODIUM CHLORIDE 0.9% FLUSH
3.0000 mL | Freq: Two times a day (BID) | INTRAVENOUS | Status: DC
  Administered 2021-06-20 – 2021-06-21 (×3): 3 mL via INTRAVENOUS

## 2021-06-19 MED ORDER — ONDANSETRON HCL 4 MG/2ML IJ SOLN: 4.0000 mg | Freq: Four times a day (QID) | INTRAMUSCULAR | Status: DC | PRN

## 2021-06-19 MED ORDER — FOLIC ACID 1 MG PO TABS
1.0000 mg | ORAL_TABLET | Freq: Every day | ORAL | Status: DC
  Administered 2021-06-19 – 2021-06-21 (×3): 1 mg via ORAL
  Filled 2021-06-19 (×3): qty 1

## 2021-06-19 MED ORDER — THIAMINE HCL 100 MG PO TABS
100.0000 mg | ORAL_TABLET | Freq: Every day | ORAL | Status: DC
  Administered 2021-06-19 – 2021-06-20 (×2): 100 mg via ORAL
  Filled 2021-06-19 (×2): qty 1

## 2021-06-19 MED ORDER — ALBUTEROL SULFATE HFA 108 (90 BASE) MCG/ACT IN AERS: 2.0000 | INHALATION_SPRAY | RESPIRATORY_TRACT | Status: DC | PRN

## 2021-06-19 MED ORDER — THIAMINE HCL 100 MG/ML IJ SOLN
100.0000 mg | Freq: Every day | INTRAMUSCULAR | Status: DC
  Administered 2021-06-21: 100 mg via INTRAVENOUS
  Filled 2021-06-19: qty 2

## 2021-06-19 MED ORDER — MOMETASONE FURO-FORMOTEROL FUM 200-5 MCG/ACT IN AERO
2.0000 | INHALATION_SPRAY | Freq: Two times a day (BID) | RESPIRATORY_TRACT | Status: DC
  Administered 2021-06-19 – 2021-06-21 (×4): 2 via RESPIRATORY_TRACT
  Filled 2021-06-19: qty 8.8

## 2021-06-19 MED ORDER — IPRATROPIUM BROMIDE 0.02 % IN SOLN
0.5000 mg | Freq: Four times a day (QID) | RESPIRATORY_TRACT | Status: DC
  Administered 2021-06-19 – 2021-06-20 (×3): 0.5 mg via RESPIRATORY_TRACT
  Filled 2021-06-19 (×3): qty 2.5

## 2021-06-19 MED ORDER — LORAZEPAM 2 MG/ML IJ SOLN: 1.0000 mg | INTRAMUSCULAR | Status: DC | PRN

## 2021-06-19 MED ORDER — ALBUTEROL SULFATE (2.5 MG/3ML) 0.083% IN NEBU
INHALATION_SOLUTION | RESPIRATORY_TRACT | Status: AC
  Filled 2021-06-19: qty 12

## 2021-06-19 MED ORDER — LORAZEPAM 2 MG/ML IJ SOLN: 0.0000 mg | Freq: Two times a day (BID) | INTRAMUSCULAR | Status: DC

## 2021-06-19 NOTE — ED Notes (Signed)
EDP placed pt on room air to assess how pt do.

## 2021-06-19 NOTE — ED Provider Notes (Signed)
[facility] EMERGENCY DEPARTMENT Provider Note  CSN: [id number] Arrival date & time: 06/19/21 1032  Chief Complaint(s) Shortness of Breath  HPI Andrea [name] is a 71 y.o. female with PMH HTN, COPD, osteoporosis, peptic ulcer disease who presents emergency department for evaluation of shortness of breath and wheezing.  Patient was seen in urgent care this morning and was given a single nebulized albuterol treatment and sent to the emergency department due to hypoxia.  She arrives saturating 94% on 2 L.  Patient does not wear home O2.  She arrives tachypneic in the 30s with shallow breathing and minimal air movement.  Denies fever, abdominal pain, nausea, vomiting or other systemic symptoms.  She states that she does have some chest pain associated with coughing.   Shortness of Breath  Past Medical History Past Medical History:  Diagnosis Date   Allergic rhinitis, cause unspecified 08/07/2012   COPD 03/23/2008   Cough 11/05/2007   HYPERTENSION 02/10/2007   MUSCLE CRAMPS 08/09/2009   OSTEOPOROSIS 02/10/2007   Other and unspecified hyperlipidemia 08/07/2012   PEPTIC ULCER DISEASE 03/23/2008   PNEUMONIA, RIGHT UPPER LOBE 03/23/2008   RESTLESS LEG SYNDROME 03/23/2008   Unspecified disorder of thyroid 11/16/2007   Patient Active Problem List   Diagnosis Date Noted   Acute gouty arthritis 10/12/2020   Vitamin D deficiency 04/10/2020   B12 deficiency 04/10/2020   Bloating symptom 04/10/2020   Constipation 04/10/2020   Hypokalemia 12/19/2018   Contact dermatitis 11/17/2018   CKD (chronic kidney disease) 11/17/2018   Anxiety and depression 02/26/2017   Low back pain 11/27/2016   Benign hypertension 10/08/2016   AKI (acute kidney injury) (HCC) 02/23/2016   Osteoarthritis, hand 01/18/2015   Bilateral knee pain 01/18/2015   Rash and nonspecific skin eruption 12/23/2014   TIA (transient ischemic attack) 02/11/2014   Allergic rhinitis 08/07/2012   Thrush 08/07/2012    Hyperlipidemia 08/07/2012   Encounter for well adult exam with abnormal findings 12/09/2010   MUSCLE CRAMPS 08/09/2009   RESTLESS LEG SYNDROME 03/23/2008   PNEUMONIA, RIGHT UPPER LOBE 03/23/2008   COPD (chronic obstructive pulmonary disease) (HCC) 03/23/2008   Peptic ulcer 03/23/2008   Disorder of thyroid 11/16/2007   Essential hypertension 02/10/2007   OSTEOPOROSIS 02/10/2007   Home Medication(s) Prior to Admission medications   Medication Sig Start Date End Date Taking? Authorizing Provider  ADVAIR DISKUS 250-50 MCG/ACT AEPB INHALE 1 PUFF INTO THE LUNGS 2 (TWO) TIMES DAILY AS NEEDED. 01/08/21   Corwin Levins, MD  albuterol (VENTOLIN HFA) 108 (90 Base) MCG/ACT inhaler Inhale 2 puffs into the lungs every 6 (six) hours as needed for wheezing or shortness of breath. 04/10/20   Corwin Levins, MD  allopurinol (ZYLOPRIM) 300 MG tablet Take 1 tablet (300 mg total) by mouth daily. 10/12/20   Corwin Levins, MD  ALPRAZolam Prudy Feeler) 0.25 MG tablet TAKE 1 TABLET BY MOUTH AT BEDTIME AS NEEDED FOR ANXIETY. 05/10/21   Corwin Levins, MD  aspirin EC 81 MG tablet Take 1 tablet (81 mg total) by mouth daily. 02/12/14   Rai, Delene Ruffini, MD  atorvastatin (LIPITOR) 20 MG tablet Take 1 tablet (20 mg total) by mouth daily. 06/28/20 06/28/21  Corwin Levins, MD  azelastine (ASTELIN) 0.1 % nasal spray azelastine 137 mcg (0.1 %) nasal spray aerosol  Spray 2 sprays twice a day by intranasal route.    [provider]  Cholecalciferol (THERA-D 2000) 50 MCG (2000 UT) TABS 1 tab by mouth once daily 04/11/20  Corwin LevinsJohn, James W, MD  [facility] VITAMIN B12 1000 MCG tablet TAKE 1 TABLET BY MOUTH EVERY DAY 01/06/20   Corwin LevinsJohn, James W, MD  escitalopram (LEXAPRO) 10 MG tablet Take 1 tablet (10 mg total) by mouth daily. 07/01/19 09/29/19  Corwin LevinsJohn, James W, MD  indomethacin (INDOCIN) 50 MG capsule Take 1 capsule (50 mg total) by mouth 3 (three) times daily as needed. 04/13/21   Corwin LevinsJohn, James W, MD  ipratropium (ATROVENT) 0.03 % nasal spray  ipratropium bromide 21 mcg (0.03 %) nasal spray    [provider]  potassium chloride (KLOR-CON) 10 MEQ tablet Take 1 tablet (10 mEq total) by mouth daily. 07/01/19   Corwin LevinsJohn, James W, MD  predniSONE (DELTASONE) 10 MG tablet 3 tabs by mouth per day for 3 days,2tabs per day for 3 days,1tab per day for 3 days 10/12/20   Corwin LevinsJohn, James W, MD  tiZANidine (ZANAFLEX) 2 MG tablet Take 1 tablet (2 mg total) by mouth every 6 (six) hours as needed for muscle spasms. 12/16/18   Corwin LevinsJohn, James W, MD  triamterene-hydrochlorothiazide [facility](MAXZIDE-25) 37.5-25 MG tablet TAKE 1 TABLET BY MOUTH EVERY DAY 08/28/20   Corwin LevinsJohn, James W, MD                                                                                                                                    Past Surgical History Past Surgical History:  Procedure Laterality Date   Breast Biopsy negative  1972   BREAST LUMPECTOMY Right    Family History Family History  Problem Relation Age of Onset   Leukemia Father    Breast cancer Daughter [id number]    Social History Social History   Tobacco Use   Smoking status: Former   Smokeless tobacco: Never   Tobacco comments:    since 2009  Vaping Use   Vaping Use: Never used  Substance Use Topics   Alcohol use: Yes   Drug use: No   Allergies Alendronate sodium, Zyrtec [cetirizine], and Sertraline hcl  Review of Systems Review of Systems  Respiratory:  Positive for shortness of breath.    Physical Exam Vital Signs  I have reviewed the triage vital signs BP 117/86 (BP Location: Right Arm)    Pulse (!) 124    Temp 98.6 F (37 C) (Oral)    Resp (!) 26    SpO2 96%   Physical Exam Vitals and nursing note reviewed.  Constitutional:      General: She is not in acute distress.    Appearance: She is well-developed.  HENT:     Head: Normocephalic and atraumatic.  Eyes:     Conjunctiva/sclera: Conjunctivae normal.  Cardiovascular:     Rate and Rhythm: Normal rate and regular rhythm.     Heart sounds: No  murmur heard. Pulmonary:     Effort: Tachypnea and accessory muscle usage present. No respiratory distress.     Breath sounds: Wheezing present.  Abdominal:     Palpations: Abdomen is soft.     Tenderness: There is no abdominal tenderness.  Musculoskeletal:        General: No swelling.     Cervical back: Neck supple.  Skin:    General: Skin is warm and dry.     Capillary Refill: Capillary refill takes less than 2 seconds.  Neurological:     Mental Status: She is alert.  Psychiatric:        Mood and Affect: Mood normal.    ED Results and Treatments Labs (all labs ordered are listed, but only abnormal results are displayed) Labs Reviewed  CBC WITH DIFFERENTIAL/PLATELET - Abnormal; Notable for the following components:      Result Value   RBC 3.82 (*)    MCV 103.4 (*)    MCH 35.3 (*)    Eosinophils Absolute 0.7 (*)    Abs Immature Granulocytes 0.11 (*)    All other components within normal limits  COMPREHENSIVE METABOLIC PANEL                                                                                                                          Radiology DG Chest 2 View  Result Date: 06/19/2021 CLINICAL DATA:  Short of breath.  History of COPD EXAM: CHEST - 2 VIEW COMPARISON:  04/10/2020 FINDINGS: COPD with pulmonary hyperinflation. Lungs are clear without infiltrate or effusion. Heart size and vascularity normal. Mild apical scarring bilaterally. IMPRESSION: No active cardiopulmonary disease. Electronically Signed   By: Marlan Palauharles  Clark M.D.   On: 06/19/2021 11:05    Pertinent labs & imaging results that were available during my care of the patient were reviewed by me and considered in my medical decision making (see MDM for details).  Medications Ordered in ED Medications  albuterol (VENTOLIN HFA) 108 (90 Base) MCG/ACT inhaler 2 puff (has no administration in time range)                                                                                                                                      Procedures .Critical Care Performed by: Glendora ScoreKommor, Mirta Mally, MD Authorized by: Glendora ScoreKommor, Trisha Morandi, MD   Critical care provider statement:    Critical care time (minutes):  30   Critical care was necessary to treat or prevent imminent or life-threatening deterioration of the following conditions:  Respiratory failure   Critical  care was time spent personally by me on the following activities:  Development of treatment plan with patient or surrogate, discussions with consultants, evaluation of patient's response to treatment, examination of patient, ordering and review of laboratory studies, ordering and review of radiographic studies, ordering and performing treatments and interventions, pulse oximetry, re-evaluation of patient's condition and review of old charts  (including critical care time)  Medical Decision Making / ED Course   This patient presents to the ED for concern of shortness of breath, this involves an extensive number of treatment options, and is a complaint that carries with it a high risk of complications and morbidity.  The differential diagnosis includes COPD exacerbation, CHF exacerbation, PE, pneumonia  MDM: Patient seen emergency department for evaluation of shortness of breath.  Physical exam reveals a tachypneic patient with shallow breathing, minimal air movement and end expiratory wheezing.  Laboratory evaluation with hypokalemia to 2.9, creatinine 1.15 but otherwise unremarkable.  Patient potassium repleted.  Patient started on a continuous albuterol and DuoNeb treatment for 1 hour and on reevaluation, her symptoms had improved but she remained tachypneic.  Patient given methylprednisolone and unfortunately her oxygen saturations dropped into the mid 80s on room air.  Thus she was returned on 2 L nasal cannula and will require admission for acute for albuterol's in the setting of a new oxygen requirement.   Additional history obtained: -Additional  history obtained from husband -External records from outside source obtained and reviewed including: Chart review including previous notes, labs, imaging, consultation notes   Lab Tests: -I ordered, reviewed, and interpreted labs.   The pertinent results include:   Labs Reviewed  CBC WITH DIFFERENTIAL/PLATELET - Abnormal; Notable for the following components:      Result Value   RBC 3.82 (*)    MCV 103.4 (*)    MCH 35.3 (*)    Eosinophils Absolute 0.7 (*)    Abs Immature Granulocytes 0.11 (*)    All other components within normal limits  COMPREHENSIVE METABOLIC PANEL      EKG   EKG Interpretation  Date/Time:  Tuesday June 19 2021 10:41:51 EST Ventricular Rate:  118 PR Interval:  148 QRS Duration: 72 QT Interval:  324 QTC Calculation: 454 R Axis:   115 Text Interpretation: Sinus tachycardia Right atrial enlargement Right axis deviation Pulmonary disease pattern Abnormal ECG When compared with ECG of 22-Dec-2015 04:31, PREVIOUS ECG IS PRESENT Confirmed by Leib Elahi ([id number]) on 06/19/2021 4:37:36 PM         Imaging Studies ordered: I ordered imaging studies including chest x-ray I independently visualized and interpreted imaging. I agree with the radiologist interpretation   Medicines ordered and prescription drug management: Meds ordered this encounter  Medications   albuterol (VENTOLIN HFA) 108 (90 Base) MCG/ACT inhaler 2 puff    -I have reviewed the patients home medicines and have made adjustments as needed  Critical interventions Multiple duo nebs, O2  Cardiac Monitoring: The patient was maintained on a cardiac monitor.  I personally viewed and interpreted the cardiac monitored which showed an underlying rhythm of: Sinus tachycardia  Social Determinants of Health:  Factors impacting patients care include: None   Reevaluation: After the interventions noted above, I reevaluated the patient and found that they have :improved  Co morbidities that  complicate the patient evaluation  Past Medical History:  Diagnosis Date   Allergic rhinitis, cause unspecified 08/07/2012   COPD 03/23/2008   Cough 11/05/2007   HYPERTENSION 02/10/2007   MUSCLE CRAMPS 08/09/2009  OSTEOPOROSIS 02/10/2007   Other and unspecified hyperlipidemia 08/07/2012   PEPTIC ULCER DISEASE 03/23/2008   PNEUMONIA, RIGHT UPPER LOBE 03/23/2008   RESTLESS LEG SYNDROME 03/23/2008   Unspecified disorder of thyroid 11/16/2007      Dispostion: I considered admission for this patient, and due to persistent oxygen requirement, patient will be admitted     Final Clinical Impression(s) / ED Diagnoses Final diagnoses:  None     @PCDICTATION @    Glendora Score, MD 06/19/21 [phone]

## 2021-06-19 NOTE — Assessment & Plan Note (Addendum)
Stable

## 2021-06-19 NOTE — ED Triage Notes (Signed)
Patient with history of COPD transferred from urgent care via [facility] for cough and shortness of breath x6 days. Patient hypoxic at urgent care on room air after albuterol nebulizer, does not wear O2 at baseline. Patient is alert, oriented, and in no apparent distress at this time.

## 2021-06-19 NOTE — Assessment & Plan Note (Addendum)
Patient presented with cough and shortness of breath.  O2 saturations noted to be as low as 86% on room air for which patient was placed on 2 L of nasal cannula oxygen with improvement in O2 saturations.  Agree with admission to monitored unit because of severity of symptoms on presentation. Patient was admitted to the hospital and treated with IV steroids and ultimately oral steroids, frequent bronchodilator therapy and azithromycin for 2 days.  She did good clinical recovery, currently on room air and mobilize around.  She does have some nagging cough along with subcostal pain from coughing otherwise no other active issues. Will discharge home with Advair that she is already taking twice a day Albuterol inhaler 4-6 times a day as needed, may use more frequently for next few days Over-the-counter cough medications and mucolytic's. Short course of prednisone therapy.

## 2021-06-19 NOTE — ED Notes (Signed)
Charge nurse given report in ED

## 2021-06-19 NOTE — Assessment & Plan Note (Addendum)
Replaced before discharge.  Will prescribe 7 days of potassium chloride.

## 2021-06-19 NOTE — Assessment & Plan Note (Addendum)
Blood pressures noted to be stable.  Home blood pressure regimen includes triamterene hydrochlorothiazide 37-25 mg daily that will be resumed.  Short course of prednisone.

## 2021-06-19 NOTE — ED Notes (Signed)
RN reassessed pt O2 sat on room air she is 95%

## 2021-06-19 NOTE — Assessment & Plan Note (Addendum)
Patient reports that she drinks 2-3 mixed drinks of bourbon couple days out of the week.  Did not have any evidence of alcohol withdrawal.  Extensively counseled and patient is determined to quit.

## 2021-06-19 NOTE — ED Triage Notes (Signed)
Pt c/o cough, fatigue, and SOB x6 days. States increase SOB today and hx of COPD. States can't walk without getting SOB. Pt speaking in complete sentences. C/o chest soreness from coughing so much. States had a neg covid test from [facility].

## 2021-06-19 NOTE — Assessment & Plan Note (Addendum)
Home medication includes atorvastatin 20 mg daily.   -Continue statin .  Mildly abnormal liver function test.

## 2021-06-19 NOTE — ED Notes (Signed)
Patient is being discharged from the Urgent Care and sent to the Emergency Department via [facility] . Per Dr Marlinda Mike, patient is in need of higher level of care due to low O2 saturation after interventions. Patient is aware and verbalizes understanding of plan of care.   Vitals:   06/19/21 0943 06/19/21 1004  BP:    Pulse: (!) 114   Resp:    Temp:    SpO2: 96% (!) 86%

## 2021-06-19 NOTE — ED Provider Triage Note (Signed)
Emergency Medicine Provider Triage Evaluation Note  Andrea [name] , a 71 y.o. female  was evaluated in triage.  Pt complains of shortness of breath, cough and malaise for 5 days. Was seen at urgent care today and was noted to be at 86% on room air. Was given one breathing treatment at [facility]. She does not wear oxygen at home.   Review of Systems  Positive: As above Negative: Fevers, chills  Physical Exam  BP 117/86 (BP Location: Right Arm)    Pulse (!) 124    Temp 98.6 F (37 C) (Oral)    Resp (!) 26    SpO2 96%  Gen:   Awake, no distress   Resp:  Normal effort  MSK:   Moves extremities without difficulty  Other:    Medical Decision Making  Medically screening exam initiated at 10:39 AM.  Appropriate orders placed.  Andrea [name] was informed that the remainder of the evaluation will be completed by another provider, this initial triage assessment does not replace that evaluation, and the importance of remaining in the ED until their evaluation is complete.     [name] T, PA-C 06/19/21 1039

## 2021-06-19 NOTE — ED Notes (Signed)
Pt placed on room air to assess O2. Pt is at 97% room air.

## 2021-06-19 NOTE — ED Provider Notes (Signed)
[facility]    CSN: [id number] Arrival date & time: 06/19/21  0847      History   Chief Complaint Chief Complaint  Patient presents with   Shortness of Breath   Cough    HPI Andrea [name] is a 71 y.o. female.    Shortness of Breath Associated symptoms: cough   Cough Associated symptoms: shortness of breath   For a 5-day history of cough, malaise, and shortness of breath.  Symptoms began February 23.  The shortness of breath got a lot worse today.  She used her ProAir inhaler about 4 hours ago it did help in the short-term but she continues to feel very short of breath.  No fever noted.  It does not sound like she has been using her Advair consistently twice daily.  And instead has been using it as needed  Initially her O2 sat was 87% and improved to 96% briefly right after an albuterol nebulizer treatment.  But then 10 to 15 minutes later her O2 sat was back to 86 to 87% on room air.  She is NOTusually on home oxygen  Past Medical History:  Diagnosis Date   Allergic rhinitis, cause unspecified 08/07/2012   COPD 03/23/2008   Cough 11/05/2007   HYPERTENSION 02/10/2007   MUSCLE CRAMPS 08/09/2009   OSTEOPOROSIS 02/10/2007   Other and unspecified hyperlipidemia 08/07/2012   PEPTIC ULCER DISEASE 03/23/2008   PNEUMONIA, RIGHT UPPER LOBE 03/23/2008   RESTLESS LEG SYNDROME 03/23/2008   Unspecified disorder of thyroid 11/16/2007    Patient Active Problem List   Diagnosis Date Noted   Acute gouty arthritis 10/12/2020   Vitamin D deficiency 04/10/2020   B12 deficiency 04/10/2020   Bloating symptom 04/10/2020   Constipation 04/10/2020   Hypokalemia 12/19/2018   Contact dermatitis 11/17/2018   CKD (chronic kidney disease) 11/17/2018   Anxiety and depression 02/26/2017   Low back pain 11/27/2016   Benign hypertension 10/08/2016   AKI (acute kidney injury) (HCC) 02/23/2016   Osteoarthritis, hand 01/18/2015   Bilateral knee pain 01/18/2015   Rash and nonspecific  skin eruption 12/23/2014   TIA (transient ischemic attack) 02/11/2014   Allergic rhinitis 08/07/2012   Thrush 08/07/2012   Hyperlipidemia 08/07/2012   Encounter for well adult exam with abnormal findings 12/09/2010   MUSCLE CRAMPS 08/09/2009   RESTLESS LEG SYNDROME 03/23/2008   PNEUMONIA, RIGHT UPPER LOBE 03/23/2008   COPD (chronic obstructive pulmonary disease) (HCC) 03/23/2008   Peptic ulcer 03/23/2008   Disorder of thyroid 11/16/2007   Essential hypertension 02/10/2007   OSTEOPOROSIS 02/10/2007    Past Surgical History:  Procedure Laterality Date   Breast Biopsy negative  1972   BREAST LUMPECTOMY Right     OB History   No obstetric history on file.      Home Medications    Prior to Admission medications   Medication Sig Start Date End Date Taking? Authorizing Provider  ADVAIR DISKUS 250-50 MCG/ACT AEPB INHALE 1 PUFF INTO THE LUNGS 2 (TWO) TIMES DAILY AS NEEDED. 01/08/21   Corwin Levins, MD  albuterol (VENTOLIN HFA) 108 (90 Base) MCG/ACT inhaler Inhale 2 puffs into the lungs every 6 (six) hours as needed for wheezing or shortness of breath. 04/10/20   Corwin Levins, MD  allopurinol (ZYLOPRIM) 300 MG tablet Take 1 tablet (300 mg total) by mouth daily. 10/12/20   Corwin Levins, MD  ALPRAZolam Prudy Feeler) 0.25 MG tablet TAKE 1 TABLET BY MOUTH AT BEDTIME AS NEEDED FOR ANXIETY. 05/10/21  Corwin Levins, MD  aspirin EC 81 MG tablet Take 1 tablet (81 mg total) by mouth daily. 02/12/14   Rai, Delene Ruffini, MD  atorvastatin (LIPITOR) 20 MG tablet Take 1 tablet (20 mg total) by mouth daily. 06/28/20 06/28/21  Corwin Levins, MD  azelastine (ASTELIN) 0.1 % nasal spray azelastine 137 mcg (0.1 %) nasal spray aerosol  Spray 2 sprays twice a day by intranasal route.    [provider]  Cholecalciferol (THERA-D 2000) 50 MCG (2000 UT) TABS 1 tab by mouth once daily 04/11/20   Corwin Levins, MD  [employer] VITAMIN B12 1000 MCG tablet TAKE 1 TABLET BY MOUTH EVERY DAY 01/06/20   Corwin Levins, MD   escitalopram (LEXAPRO) 10 MG tablet Take 1 tablet (10 mg total) by mouth daily. 07/01/19 09/29/19  Corwin Levins, MD  indomethacin (INDOCIN) 50 MG capsule Take 1 capsule (50 mg total) by mouth 3 (three) times daily as needed. 04/13/21   Corwin Levins, MD  ipratropium (ATROVENT) 0.03 % nasal spray ipratropium bromide 21 mcg (0.03 %) nasal spray    [provider]  potassium chloride (KLOR-CON) 10 MEQ tablet Take 1 tablet (10 mEq total) by mouth daily. 07/01/19   Corwin Levins, MD  predniSONE (DELTASONE) 10 MG tablet 3 tabs by mouth per day for 3 days,2tabs per day for 3 days,1tab per day for 3 days 10/12/20   Corwin Levins, MD  tiZANidine (ZANAFLEX) 2 MG tablet Take 1 tablet (2 mg total) by mouth every 6 (six) hours as needed for muscle spasms. 12/16/18   Corwin Levins, MD  triamterene-hydrochlorothiazide [facility]) 37.5-25 MG tablet TAKE 1 TABLET BY MOUTH EVERY DAY 08/28/20   Corwin Levins, MD    Family History Family History  Problem Relation Age of Onset   Leukemia Father    Breast cancer Daughter 4    Social History Social History   Tobacco Use   Smoking status: Former   Smokeless tobacco: Never   Tobacco comments:    since 2009  Vaping Use   Vaping Use: Never used  Substance Use Topics   Alcohol use: Yes   Drug use: No     Allergies   Alendronate sodium, Zyrtec [cetirizine], and Sertraline hcl   Review of Systems Review of Systems  Respiratory:  Positive for cough and shortness of breath.     Physical Exam Triage Vital Signs ED Triage Vitals  Enc Vitals Group     BP 06/19/21 0926 139/83     Pulse Rate 06/19/21 0926 (!) 117     Resp 06/19/21 0926 18     Temp 06/19/21 0926 98.1 F (36.7 C)     Temp Source 06/19/21 0926 Oral     SpO2 06/19/21 0926 (!) 87 %     Weight --      Height --      Head Circumference --      Peak Flow --      Pain Score 06/19/21 0927 0     Pain Loc --      Pain Edu? --      Excl. in GC? --    No data found.  Updated Vital  Signs BP 139/83 (BP Location: Right Arm)    Pulse (!) 114    Temp 98.1 F (36.7 C) (Oral)    Resp 18    SpO2 (!) 86%   Visual Acuity Right Eye Distance:   Left Eye Distance:   Bilateral  Distance:    Right Eye Near:   Left Eye Near:    Bilateral Near:     Physical Exam Vitals reviewed.  Constitutional:      Comments: Leaning over, working to breathe.  Able to speak 3-4 words in a row  HENT:     Mouth/Throat:     Mouth: Mucous membranes are moist.  Eyes:     Extraocular Movements: Extraocular movements intact.     Pupils: Pupils are equal, round, and reactive to light.  Cardiovascular:     Rate and Rhythm: Regular rhythm. Tachycardia present.     Heart sounds: No murmur heard. Pulmonary:     Breath sounds: No rhonchi.     Comments: Initially in worse respiratory distress than after the treatment.  However she continues to work to breathe and her respiratory rate is still about 22-26.  Less wheezes now, but expiratory phase is diminished Musculoskeletal:     Cervical back: Neck supple.  Skin:    Coloration: Skin is not pale.  Neurological:     Mental Status: She is alert and oriented to person, place, and time.  Psychiatric:        Behavior: Behavior normal.     UC Treatments / Results  Labs (all labs ordered are listed, but only abnormal results are displayed) Labs Reviewed - No data to display  EKG   Radiology No results found.  Procedures Procedures (including critical care time)  Medications Ordered in UC Medications  albuterol (PROVENTIL) (2.5 MG/3ML) 0.083% nebulizer solution 2.5 mg (2.5 mg Nebulization Given 06/19/21 0937)    Initial Impression / Assessment and Plan / UC Course  I have reviewed the triage vital signs and the nursing notes.  Pertinent labs & imaging results that were available during my care of the patient were reviewed by me and considered in my medical decision making (see chart for details).     With her O2 sat low despite the  breathing treatment, we have to send her to the emergency room for further evaluation and treatment.  We will send her back ambulance, as she is not stable for transfer in the private car with her husband.  We will apply O2 by [place] here in the office Final Clinical Impressions(s) / UC Diagnoses   Final diagnoses:  COPD exacerbation (HCC)  Acute respiratory failure with hypoxia [facility])     Discharge Instructions      You are sent by ambulance to the ED for further testing and treatment, since your oxygen is still low in your blood     ED Prescriptions   None    PDMP not reviewed this encounter.   Zenia Resides, MD 06/19/21 1006

## 2021-06-19 NOTE — Discharge Instructions (Addendum)
You are sent by ambulance to the ED for further testing and treatment, since your oxygen is still low in your blood

## 2021-06-19 NOTE — H&P (Signed)
History and Physical    Patient: Andrea [name] EZM:[id number] DOB: 12-06-1950 DOA: 06/19/2021 DOS: the patient was seen and examined on 06/19/2021 PCP: Corwin Levins, MD  Patient coming from: Urgent care  Chief Complaint:  Chief Complaint  Patient presents with   Shortness of Breath    HPI: Andrea [name] is a 71 y.o. female with medical history significant of hypertension, PVD, and remote history of tobacco use who presents with progressively worsening shortness of breath over the last week.  She reported having a productive cough, but was having a difficult coughing up phlegm.  Associated symptoms included chest tightness, wheezing, and generalized malaise.  Denies any fever chills, nausea, vomiting, or diarrhea.  At home she has been using her rescue albuterol inhaler every 3 hours or so with just temporary relief in symptoms.  She had made appointment with her primary care provider scheduled for 3/2.  However, this morning was unable to catch her breath for which she went to urgent care.  While in urgent care O2 saturations was noted to be as low as 87% and improved temporarily after breathing treatment.  Normally patient is not on oxygen at baseline.  O2 saturations were noted to drop again as low as 86% on room air for which she was placed on 2 L of nasal cannula oxygen and transferred to the ED for further evaluation.  She has not smoked in over 10 years, but does drink 2-3 mixed drinks of bourbon per day on average.  Her daughter makes note that the patient has had some intermittent short-term memory loss.  To her knowledge she has not recently been on steroids or antibiotics in the last couple of months.  In the emergency department patient was noted to be afebrile with pulse 114- 141, respirations elevated to 31, and O2 saturations as low as 86% with O2 saturations currently maintained on 2 L of nasal cannula oxygen.  Labs significant for potassium of 2.9, creatinine 1.15,  alkaline phosphatase 133, AST 50, and ALT 21.  Chest x-ray did not note any acute abnormalities.  Patient has been given breathing treatments, Solu-Medrol 125 mg IV, and potassium 20 mEq.  Review of Systems: As mentioned in the history of present illness. All other systems reviewed and are negative. Past Medical History:  Diagnosis Date   Allergic rhinitis, cause unspecified 08/07/2012   COPD 03/23/2008   Cough 11/05/2007   HYPERTENSION 02/10/2007   MUSCLE CRAMPS 08/09/2009   OSTEOPOROSIS 02/10/2007   Other and unspecified hyperlipidemia 08/07/2012   PEPTIC ULCER DISEASE 03/23/2008   PNEUMONIA, RIGHT UPPER LOBE 03/23/2008   RESTLESS LEG SYNDROME 03/23/2008   Unspecified disorder of thyroid 11/16/2007   Past Surgical History:  Procedure Laterality Date   Breast Biopsy negative  1972   BREAST LUMPECTOMY Right    Social History:  reports that she has quit smoking. She has never used smokeless tobacco. She reports current alcohol use. She reports that she does not use drugs.  Allergies  Allergen Reactions   Alendronate Sodium Nausea Only    GI upset   Zyrtec [Cetirizine] Itching, Rash and Other (See Comments)    Tongue burning, thrush   Sertraline Hcl Other (See Comments)    unknown    Family History  Problem Relation Age of Onset   Leukemia Father    Breast cancer Daughter 20    Prior to Admission medications   Medication Sig Start Date End Date Taking? Authorizing Provider  ADVAIR DISKUS 250-50 MCG/ACT AEPB  INHALE 1 PUFF INTO THE LUNGS 2 (TWO) TIMES DAILY AS NEEDED. 01/08/21   Corwin Levins, MD  albuterol (VENTOLIN HFA) 108 (90 Base) MCG/ACT inhaler Inhale 2 puffs into the lungs every 6 (six) hours as needed for wheezing or shortness of breath. 04/10/20   Corwin Levins, MD  allopurinol (ZYLOPRIM) 300 MG tablet Take 1 tablet (300 mg total) by mouth daily. 10/12/20   Corwin Levins, MD  ALPRAZolam Prudy Feeler) 0.25 MG tablet TAKE 1 TABLET BY MOUTH AT BEDTIME AS NEEDED FOR ANXIETY. 05/10/21    Corwin Levins, MD  aspirin EC 81 MG tablet Take 1 tablet (81 mg total) by mouth daily. 02/12/14   Rai, Delene Ruffini, MD  atorvastatin (LIPITOR) 20 MG tablet Take 1 tablet (20 mg total) by mouth daily. 06/28/20 06/28/21  Corwin Levins, MD  azelastine (ASTELIN) 0.1 % nasal spray azelastine 137 mcg (0.1 %) nasal spray aerosol  Spray 2 sprays twice a day by intranasal route.    [provider]  Cholecalciferol (THERA-D 2000) 50 MCG (2000 UT) TABS 1 tab by mouth once daily 04/11/20   Corwin Levins, MD  [employer] VITAMIN B12 1000 MCG tablet TAKE 1 TABLET BY MOUTH EVERY DAY 01/06/20   Corwin Levins, MD  escitalopram (LEXAPRO) 10 MG tablet Take 1 tablet (10 mg total) by mouth daily. 07/01/19 09/29/19  Corwin Levins, MD  indomethacin (INDOCIN) 50 MG capsule Take 1 capsule (50 mg total) by mouth 3 (three) times daily as needed. 04/13/21   Corwin Levins, MD  ipratropium (ATROVENT) 0.03 % nasal spray ipratropium bromide 21 mcg (0.03 %) nasal spray    [provider]  potassium chloride (KLOR-CON) 10 MEQ tablet Take 1 tablet (10 mEq total) by mouth daily. 07/01/19   Corwin Levins, MD  predniSONE (DELTASONE) 10 MG tablet 3 tabs by mouth per day for 3 days,2tabs per day for 3 days,1tab per day for 3 days 10/12/20   Corwin Levins, MD  tiZANidine (ZANAFLEX) 2 MG tablet Take 1 tablet (2 mg total) by mouth every 6 (six) hours as needed for muscle spasms. 12/16/18   Corwin Levins, MD  triamterene-hydrochlorothiazide [facility]) 37.5-25 MG tablet TAKE 1 TABLET BY MOUTH EVERY DAY 08/28/20   Corwin Levins, MD    Physical Exam: Vitals:   06/19/21 1200 06/19/21 1230 06/19/21 1300 06/19/21 1330  BP: (!) 134/92 136/81 131/81 139/82  Pulse: (!) 117 (!) 121 (!) 137 (!) 141  Resp: (!) 24 (!) 27 (!) 31 (!) 23  Temp:      TempSrc:      SpO2: 96% 96% 96% 94%  Weight:      Height:         Constitutional: Elderly female who appears to be little anxious but in no acute distress at this Eyes: PERRL, lids and conjunctivae  normal ENMT: Mucous membranes are moist. Posterior pharynx clear of any exudate or lesions.  Neck: normal, supple, no JVD appreciated Respiratory: Decreased overall aeration with mild expiratory wheezes appreciated. Cardiovascular: Tachycardia, no murmurs / rubs / gallops. No extremity edema. 2+ pedal pulses.   Abdomen: no tenderness, no masses palpated.  Bowel sounds positive.  Musculoskeletal: no clubbing / cyanosis. No joint deformity upper and lower extremities. Skin: no rashes, lesions, ulcers. No induration Neurologic: CN 2-12 grossly intact.  Mild tremor appreciated. Psychiatric: Normal judgment and insight. Alert and oriented x 3. Normal mood.    Data Reviewed:  EKG significant for sinus tachycardia  118 bpm with QTc 454.  Assessment and Plan: * Acute respiratory failure with hypoxia secondary to COPD exacerbation (HCC)- (present on admission) Patient presents with cough and shortness of breath.  O2 saturations noted to be as low as 86% on room air for which patient was placed on 2 L of nasal cannula oxygen with improvement in O2 saturations.  Wheezing appreciated on physical exam.  Patient has been given a continuous albuterol breathing treatment -Admit to a telemetry bed -Continuous pulse oximetry with nasal cannula oxygen maintain O2 saturation greater than 92% -Flutter valve -Levalbuterol and ipratropium nebs 4 times daily -Pharmacy substitution for Advair -Mucinex -Check pulse oximetry with ambulation in a.m. -PT consulted to evaluate -Empiric antibiotics of azithromycin   Alcohol use- (present on admission) Patient reports that she drinks 2-3 mixed drinks of bourbon couple days out of the week, but her daughter makes note that the patient may drink on a daily basis.  Patient is elevated AST to ALT ratio along with elevated MCV/MCH also suggests alcohol use. -CIWA protocols with scheduled Ativan -Folate, thiamine, MVI  Hypokalemia- (present on admission) Acute  potassium 2.9 on admission.  Patient has been given 40 mEq of potassium chloride in the ED. -Give additional 20 mEq of potassium chloride -Continue to monitor and replace as needed  Hyperlipidemia- (present on admission) Home medication includes atorvastatin 20 mg daily.   -Continue statin and monitor liver function study  Essential hypertension- (present on admission) Blood pressures noted to be stable.  Home blood pressure regimen includes triamterene hydrochlorothiazide 37-25 mg daily. -Held initially due to low potassium level  Transaminitis- (present on admission) Patient noted to have 1 phosphatase 133, AST 50, and ALT 21.  The AST to ALT ratio is consistent with patient's reports of alcohol use. -Add on hepatitis panel    Advance Care Planning:   Code Status: Full Code    Consults: None  Family Communication: Family including daughter and husband stated at bedside  Severity of Illness: The appropriate patient status for this patient is INPATIENT. Inpatient status is judged to be reasonable and necessary in order to provide the required intensity of service to ensure the patient's safety. The patient's presenting symptoms, physical exam findings, and initial radiographic and laboratory data in the context of their chronic comorbidities is felt to place them at high risk for further clinical deterioration. Furthermore, it is not anticipated that the patient will be medically stable for discharge from the hospital within 2 midnights of admission.   * I certify that at the point of admission it is my clinical judgment that the patient will require inpatient hospital care spanning beyond 2 midnights from the point of admission due to high intensity of service, high risk for further deterioration and high frequency of surveillance required.*  Author: Clydie Braun, MD 06/19/2021 2:22 PM  For on call review [email or web address].

## 2021-06-19 NOTE — ED Notes (Signed)
[facility] transport en route

## 2021-06-20 DIAGNOSIS — J441 Chronic obstructive pulmonary disease with (acute) exacerbation: Secondary | ICD-10-CM | POA: Diagnosis present

## 2021-06-20 LAB — CBC
HCT: 34.1 % — ABNORMAL LOW (ref 36.0–46.0)
Hemoglobin: 11.6 g/dL — ABNORMAL LOW (ref 12.0–15.0)
MCH: 35.5 pg — ABNORMAL HIGH (ref 26.0–34.0)
MCHC: 34 g/dL (ref 30.0–36.0)
MCV: 104.3 fL — ABNORMAL HIGH (ref 80.0–100.0)
Platelets: 297 10*3/uL (ref 150–400)
RBC: 3.27 MIL/uL — ABNORMAL LOW (ref 3.87–5.11)
RDW: 13.8 % (ref 11.5–15.5)
WBC: 9.3 10*3/uL (ref 4.0–10.5)
nRBC: 0 % (ref 0.0–0.2)

## 2021-06-20 LAB — BASIC METABOLIC PANEL
Anion gap: 12 (ref 5–15)
BUN: 35 mg/dL — ABNORMAL HIGH (ref 8–23)
CO2: 17 mmol/L — ABNORMAL LOW (ref 22–32)
Calcium: 9.2 mg/dL (ref 8.9–10.3)
Chloride: 106 mmol/L (ref 98–111)
Creatinine, Ser: 1.61 mg/dL — ABNORMAL HIGH (ref 0.44–1.00)
GFR, Estimated: 34 mL/min — ABNORMAL LOW (ref >60)
Glucose, Bld: 143 mg/dL — ABNORMAL HIGH (ref 70–99)
Potassium: 4.5 mmol/L (ref 3.5–5.1)
Sodium: 135 mmol/L (ref 135–145)

## 2021-06-20 LAB — MAGNESIUM: Magnesium: 1.8 mg/dL (ref 1.7–2.4)

## 2021-06-20 LAB — PHOSPHORUS: Phosphorus: 1.9 mg/dL — ABNORMAL LOW (ref 2.5–4.6)

## 2021-06-20 LAB — GLUCOSE, CAPILLARY: Glucose-Capillary: 124 mg/dL — ABNORMAL HIGH (ref 70–99)

## 2021-06-20 MED ORDER — LEVALBUTEROL HCL 0.63 MG/3ML IN NEBU
0.6300 mg | INHALATION_SOLUTION | Freq: Four times a day (QID) | RESPIRATORY_TRACT | Status: DC
  Administered 2021-06-20 – 2021-06-21 (×2): 0.63 mg via RESPIRATORY_TRACT
  Filled 2021-06-20 (×2): qty 3

## 2021-06-20 MED ORDER — PREDNISOLONE 5 MG PO TABS: 40.0000 mg | ORAL_TABLET | Freq: Every day | ORAL | Status: DC

## 2021-06-20 MED ORDER — ENOXAPARIN SODIUM 30 MG/0.3ML IJ SOSY
30.0000 mg | PREFILLED_SYRINGE | INTRAMUSCULAR | Status: DC
  Administered 2021-06-20: 30 mg via SUBCUTANEOUS
  Filled 2021-06-20: qty 0.3

## 2021-06-20 MED ORDER — K PHOS MONO-SOD PHOS DI & MONO 155-852-130 MG PO TABS
500.0000 mg | ORAL_TABLET | Freq: Three times a day (TID) | ORAL | Status: DC
  Administered 2021-06-20 – 2021-06-21 (×4): 500 mg via ORAL
  Filled 2021-06-20 (×8): qty 2

## 2021-06-20 MED ORDER — PREDNISONE 20 MG PO TABS
40.0000 mg | ORAL_TABLET | Freq: Every day | ORAL | Status: DC
  Administered 2021-06-20 – 2021-06-21 (×2): 40 mg via ORAL
  Filled 2021-06-20 (×2): qty 2

## 2021-06-20 MED ORDER — IPRATROPIUM BROMIDE 0.02 % IN SOLN
0.5000 mg | Freq: Four times a day (QID) | RESPIRATORY_TRACT | Status: DC
  Administered 2021-06-20 – 2021-06-21 (×2): 0.5 mg via RESPIRATORY_TRACT
  Filled 2021-06-20 (×2): qty 2.5

## 2021-06-20 NOTE — Assessment & Plan Note (Deleted)
As above.

## 2021-06-20 NOTE — Assessment & Plan Note (Addendum)
Resolved.  Patient is on room air now ?

## 2021-06-20 NOTE — Progress Notes (Signed)
?Progress Note ? ? ?Patient: Andrea [name] SJG:[id number] DOB: [birth year] DOA: 06/19/2021     1 ?DOS: the patient was seen and examined on 06/20/2021 ?  ?Brief hospital course: ?71 year old female with history of hypertension, peripheral vascular disease, previous smoker and history of COPD presented with 1 week of progressive shortness of breath, productive cough with phlegm.  She went to urgent care, found to have 87% on room air and sent to ER on 2 L oxygen.  Chest x-ray without any active findings.  Takes albuterol and Advair at home, patient stated not taken for last 10 days.  Admitted with COPD exacerbation. ? ?Assessment and Plan: ?* Acute respiratory failure with hypoxia secondary to COPD exacerbation (HCC)- (present on admission) ?Patient presents with cough and shortness of breath.  O2 saturations noted to be as low as 86% on room air for which patient was placed on 2 L of nasal cannula oxygen with improvement in O2 saturations.  ?Agree with admission to monitored unit because of severity of symptoms on presentation. ?bronchodilator therapy, IV steroid and tapering oral steroid, inhalational steroids, scheduled and as needed bronchodilators, deep breathing exercises, incentive spirometry, chest physiotherapy and respiratory therapy consult. ?Antibiotics due to severity of symptoms.  Will treat with 5 days of azithromycin. ?Supplemental oxygen to keep saturations more than 92%. ?Mobilize with PT. ? ?COPD with acute exacerbation (HCC)- (present on admission) ?As above. ? ?Alcohol use- (present on admission) ?Patient reports that she drinks 2-3 mixed drinks of bourbon couple days out of the week, but her daughter makes note that the patient may drink on a daily basis.  Patient is elevated AST to ALT ratio along with elevated MCV/MCH also suggests alcohol use. ?-CIWA protocols with scheduled Ativan ?-Folate, thiamine, MVI ?-Stable so far. ? ?Transaminitis- (present on admission) ?Patient noted to have 1  phosphatase 133, AST 50, and ALT 21.  The AST to ALT ratio is consistent with patient's reports of alcohol use. ?-Acute hepatitis panel negative.  Likely chronic. ? ?Acute respiratory failure with hypoxia (HCC)- (present on admission) ?Currently on room air.  Mobilize and monitor. ? ?Hypokalemia- (present on admission) ?Acute potassium 2.9 on admission.  Patient has been given 40 mEq of potassium chloride in the ED. ?Potassium is adequate.  Replace phosphorus today. ? ?Hyperlipidemia- (present on admission) ?Home medication includes atorvastatin 20 mg daily.   ?-Continue statin .  Mildly abnormal liver function test. ? ?Essential hypertension- (present on admission) ?Blood pressures noted to be stable.  Home blood pressure regimen includes triamterene hydrochlorothiazide 37-25 mg daily. ? ? ? ?Can go to MedSurg unit. ? ? ? ? ?Subjective: Patient seen and examined.  Feels slightly tremulous otherwise denies any wheezing or shortness of breath.  She does have subcostal pain due to coughing.  Currently on room air.  Not mobilized. ? ?Physical Exam: ?Vitals:  ? 06/20/21 0715 06/20/21 0730 06/20/21 0745 06/20/21 0800  ?BP: 124/83 122/82 120/82 126/86  ?Pulse: 98 96 93 92  ?Resp: (!) 26 (!) 34 (!) 29 (!) 21  ?Temp:      ?TempSrc:      ?SpO2: 94% 95% 94% 95%  ?Weight:      ?Height:      ? ?General: Chronically sick looking but not in any distress.  On room air. ?Cardiovascular: S1-S2 normal.  Regular rate rhythm.  Tachycardic. ?Respiratory: Bilateral clear with occasional expiratory wheezes.  No other added sounds. ?Gastrointestinal: Soft.  Nontender.  Bowel sound present. ?Ext: No edema or cyanosis. ?Neuro: Intact. ?Musculoskeletal:  No deformities. ?Skin: Intact. ? ? ? ?Data Reviewed: ? ?Results reviewed with patient and family. ? ?Family Communication: Daughter on the phone. ? ?Disposition: ?Status is: Inpatient ?Remains inpatient appropriate because: Persistent wheezing and shortness of breath. ? ? ? ? ? ? ? ? ? Planned  Discharge Destination: Home ? ? ? ? ?Time spent: 35 minutes ? ?Author: ?Dorcas Carrow, MD ?06/20/2021 8:32 AM ? ?For on call review [email or web address].  ? ?

## 2021-06-20 NOTE — Hospital Course (Signed)
71 year old female with history of hypertension, peripheral vascular disease, previous smoker and history of COPD presented with 1 week of progressive shortness of breath, productive cough with phlegm.  She went to urgent care, found to have 87% on room air and sent to ER on 2 L oxygen.  Chest x-ray without any active findings.  Takes albuterol and Advair at home, patient stated not taken for last 10 days.  Admitted with COPD exacerbation. ?

## 2021-06-20 NOTE — Progress Notes (Signed)
NEW ADMISSION NOTE ?New Admission Note:  ? ?Arrival Method: wheelchair ?Mental Orientation: A&O X4 ?Telemetry: none ?Assessment: Completed ?Skin: ?IV: LAC ?Pain:0/10 ?Tubes:NONE ?Safety Measures: Safety Fall Prevention Plan has been given, discussed and signed ?Admission: Completed ?[facility] Orientation: Patient has been orientated to the room, unit and staff.  ?Family:daughter at bedside ? ? ?Orders have been reviewed and implemented. Will continue to monitor the patient. Call light has been placed within reach and bed alarm has been activated.  ? ?Jerame Hedding S Derrick Tiegs, RN   ?

## 2021-06-20 NOTE — ED Notes (Signed)
Baxter Hire daughter [phone] requesting an update on the patient   ?

## 2021-06-21 ENCOUNTER — Ambulatory Visit: Payer: Medicare Other | Admitting: Internal Medicine

## 2021-06-21 LAB — BASIC METABOLIC PANEL
Anion gap: 9 (ref 5–15)
BUN: 34 mg/dL — ABNORMAL HIGH (ref 8–23)
CO2: 22 mmol/L (ref 22–32)
Calcium: 9.2 mg/dL (ref 8.9–10.3)
Chloride: 108 mmol/L (ref 98–111)
Creatinine, Ser: 1.2 mg/dL — ABNORMAL HIGH (ref 0.44–1.00)
GFR, Estimated: 49 mL/min — ABNORMAL LOW (ref >60)
Glucose, Bld: 98 mg/dL (ref 70–99)
Potassium: 3.3 mmol/L — ABNORMAL LOW (ref 3.5–5.1)
Sodium: 139 mmol/L (ref 135–145)

## 2021-06-21 MED ORDER — IPRATROPIUM BROMIDE 0.02 % IN SOLN: 0.5000 mg | Freq: Three times a day (TID) | RESPIRATORY_TRACT | Status: DC

## 2021-06-21 MED ORDER — LEVALBUTEROL HCL 0.63 MG/3ML IN NEBU: 0.6300 mg | INHALATION_SOLUTION | Freq: Three times a day (TID) | RESPIRATORY_TRACT | Status: DC

## 2021-06-21 MED ORDER — PREDNISONE 10 MG PO TABS: ORAL_TABLET | ORAL | 0 refills | Status: DC

## 2021-06-21 MED ORDER — POTASSIUM CHLORIDE CRYS ER 20 MEQ PO TBCR
40.0000 meq | EXTENDED_RELEASE_TABLET | Freq: Once | ORAL | Status: AC
Start: 2021-06-21 — End: 2021-06-21
  Administered 2021-06-21: 40 meq via ORAL
  Filled 2021-06-21: qty 2

## 2021-06-21 MED ORDER — POTASSIUM CHLORIDE ER 10 MEQ PO TBCR: 10.0000 meq | EXTENDED_RELEASE_TABLET | Freq: Every day | ORAL | 0 refills | Status: DC

## 2021-06-21 NOTE — Evaluation (Signed)
Physical Therapy Evaluation ?Patient Details ?Name: Andrea [name] ?MRN: [id number] ?DOB: 12-31-1950 ?Today's Date: 06/21/2021 ? ?History of Present Illness ? Pt adm 2/28 with acute respiratory failure with hypoxia due to copd exacerbation. PMH - copd, etoh, HTN, osteoporosis, PVD.  ?Clinical Impression ? Pt able to amb in hallway and up/down stairs in preparation to return home. SpO2 90% with amb on RA. Hand tremors noted. Ready for dc from PT standpoint.   ?   ? ?Recommendations for follow up therapy are one component of a multi-disciplinary discharge planning process, led by the attending physician.  Recommendations may be updated based on patient status, additional functional criteria and insurance authorization. ? ?Follow Up Recommendations No PT follow up ? ?  ?Assistance Recommended at Discharge PRN  ?Patient can return home with the following ?   ? ?  ?Equipment Recommendations None recommended by PT  ?Recommendations for Other Services ?    ?  ?Functional Status Assessment Patient has not had a recent decline in their functional status  ? ?  ?Precautions / Restrictions Precautions ?Precautions: None  ? ?  ? ?Mobility ? Bed Mobility ?Overal bed mobility: Independent ?  ?  ?  ?  ?  ?  ?  ?  ? ?Transfers ?Overall transfer level: Independent ?Equipment used: None ?  ?  ?  ?  ?  ?  ?  ?  ?  ? ?Ambulation/Gait ?Ambulation/Gait assistance: Modified independent (Device/Increase time), Supervision ?Gait Distance (Feet): 200 Feet ?Assistive device: None, 1 person hand held assist, Rollator (4 wheels) ?Gait Pattern/deviations: Step-through pattern, Decreased stride length ?Gait velocity: decr ?Gait velocity interpretation: 1.31 - 2.62 ft/sec, indicative of limited community ambulator ?  ?General Gait Details: In room using furniture or in hallway using rollator pt is modified independent. In hallway without assistive device pt min guard for safety. ? ?Stairs ?Stairs: Yes ?Stairs assistance: Supervision, Min  guard ?Stair Management: One rail Right, Alternating pattern, Forwards ?Number of Stairs: 11 ?General stair comments: Up stairs with supervision and use of rail. Down stairs with rail and min guard for safety. ? ?Wheelchair Mobility ?  ? ?Modified Rankin (Stroke Patients Only) ?  ? ?  ? ?Balance Overall balance assessment: Mild deficits observed, not formally tested ?  ?  ?  ?  ?  ?  ?  ?  ?  ?  ?  ?  ?  ?  ?  ?  ?  ?  ?   ? ? ? ?Pertinent Vitals/Pain Pain Assessment ?Pain Assessment: No/denies pain  ? ? ?Home Living Family/patient expects to be discharged to:: Private residence ?Living Arrangements: Spouse/significant other ?Available Help at Discharge: Family;Available PRN/intermittently ?Type of Home: House ?Home Access: Stairs to enter ?Entrance Stairs-Rails: Right ?Entrance Stairs-Number of Steps: 5 ?Alternate Level Stairs-Number of Steps: 14 ?Home Layout: Two level ?Home Equipment: Rollator (4 wheels);Cane - single point ?Additional Comments: Pt and husband share cane and rollator as needed.  ?  ?Prior Function Prior Level of Function : Independent/Modified Independent ?  ?  ?  ?  ?  ?  ?Mobility Comments: Uses cane or rollator as needed ?  ?  ? ? ?Hand Dominance  ? Dominant Hand: Right ? ?  ?Extremity/Trunk Assessment  ? Upper Extremity Assessment ?Upper Extremity Assessment: Overall WFL for tasks assessed ?  ? ?Lower Extremity Assessment ?Lower Extremity Assessment: Overall WFL for tasks assessed ?  ? ?   ?Communication  ? Communication: No difficulties  ?Cognition Arousal/Alertness: Awake/alert ?Behavior During  Therapy: WFL for tasks assessed/performed ?Overall Cognitive Status: Within Functional Limits for tasks assessed ?  ?  ?  ?  ?  ?  ?  ?  ?  ?  ?  ?  ?  ?  ?  ?  ?General Comments: Per chart some baseline short term memory loss ?  ?  ? ?  ?General Comments General comments (skin integrity, edema, etc.): SpO2 90% with activity on RA and 91% at rest on RA. HR 120's with activity. ? ?  ?Exercises     ? ?Assessment/Plan  ?  ?PT Assessment Patient does not need any further PT services  ?PT Problem List   ? ?   ?  ?PT Treatment Interventions     ? ?PT Goals (Current goals can be found in the Care Plan section)  ?Acute Rehab PT Goals ?PT Goal Formulation: All assessment and education complete, DC therapy ? ?  ?Frequency   ?  ? ? ?Co-evaluation   ?  ?  ?  ?  ? ? ?  ?AM-PAC PT "6 Clicks" Mobility  ?Outcome Measure Help needed turning from your back to your side while in a flat bed without using bedrails?: None ?Help needed moving from lying on your back to sitting on the side of a flat bed without using bedrails?: None ?Help needed moving to and from a bed to a chair (including a wheelchair)?: None ?Help needed standing up from a chair using your arms (e.g., wheelchair or bedside chair)?: None ?Help needed to walk in hospital room?: None ?Help needed climbing 3-5 steps with a railing? : A Little ?6 Click Score: 23 ? ?  ?End of Session Equipment Utilized During Treatment: Gait belt ?Activity Tolerance: Patient tolerated treatment well ?Patient left: in bed;with call bell/phone within reach ?Nurse Communication: Mobility status ?PT Visit Diagnosis: Other abnormalities of gait and mobility (R26.89) ?  ? ?Time: 2440-1027 ?PT Time Calculation (min) (ACUTE ONLY): 14 min ? ? ?Charges:   PT Evaluation ?$PT Eval Low Complexity: 1 Low ?  ?  ?   ? ? ?[facility] PT ?Acute Rehabilitation Services ?Pager [phone] ?Office [phone] ? ? ?[facility] ?06/21/2021, 10:18 AM ? ?

## 2021-06-21 NOTE — Discharge Summary (Signed)
?Physician Discharge Summary ?  ?Patient: Andrea [name] MRN: [id number] DOB: [birth year]  ?Admit date:     06/19/2021  ?Discharge date: 06/21/21  ?Discharge Physician: Dorcas Carrow  ? ?PCP: Corwin Levins, MD  ? ?Recommendations at discharge:  ? ?Over-the-counter Tylenol and cough medications can be used. ? ?Discharge Diagnoses: ?Principal Problem: ?  Acute respiratory failure with hypoxia secondary to COPD exacerbation (HCC) ?Active Problems: ?  Essential hypertension ?  Hyperlipidemia ?  Hypokalemia ?  Acute respiratory failure with hypoxia (HCC) ?  Transaminitis ?  Alcohol use ?  COPD with acute exacerbation (HCC) ? ?Resolved Problems: ?  * No resolved hospital problems. * ? ? ?Hospital Course: ?71 year old female with history of hypertension, peripheral vascular disease, previous smoker and history of COPD presented with 1 week of progressive shortness of breath, productive cough with phlegm.  She went to urgent care, found to have 87% on room air and sent to ER on 2 L oxygen.  Chest x-ray without any active findings.  Takes albuterol and Advair at home, patient stated not taken for last 10 days.  Admitted with COPD exacerbation. ? ?Assessment and Plan: ?* Acute respiratory failure with hypoxia secondary to COPD exacerbation (HCC) ?Patient presented with cough and shortness of breath.  O2 saturations noted to be as low as 86% on room air for which patient was placed on 2 L of nasal cannula oxygen with improvement in O2 saturations.  ?Agree with admission to monitored unit because of severity of symptoms on presentation. ?Patient was admitted to the hospital and treated with IV steroids and ultimately oral steroids, frequent bronchodilator therapy and azithromycin for 2 days.  She did good clinical recovery, currently on room air and mobilize around.  She does have some nagging cough along with subcostal pain from coughing otherwise no other active issues. ?Will discharge home with ?Advair that she is  already taking twice a day ?Albuterol inhaler 4-6 times a day as needed, may use more frequently for next few days ?Over-the-counter cough medications and mucolytic's. ?Short course of prednisone therapy. ? ? ?Alcohol use ?Patient reports that she drinks 2-3 mixed drinks of bourbon couple days out of the week.  Did not have any evidence of alcohol withdrawal.  Extensively counseled and patient is determined to quit. ? ?Transaminitis ?Stable. ? ?Acute respiratory failure with hypoxia (HCC) ?Resolved.  Patient is on room air now. ? ?Hypokalemia ?Replaced before discharge.  Will prescribe 7 days of potassium chloride. ? ?Hyperlipidemia ?Home medication includes atorvastatin 20 mg daily.   ?-Continue statin .  Mildly abnormal liver function test. ? ?Essential hypertension ?Blood pressures noted to be stable.  Home blood pressure regimen includes triamterene hydrochlorothiazide 37-25 mg daily that will be resumed.  Short course of prednisone. ? ? ? ?Ambulating around the hallway.  Medically stable to discharge. ? ?  ? ? ?Consultants: None ?Procedures performed: None ?Disposition: Home ?Diet recommendation:  ?Discharge Diet Orders (From admission, onward)  ? ?  Start     Ordered  ? 06/21/21 0000  Diet - low sodium heart healthy       ? 06/21/21 0914  ? ?  ?  ? ?  ? ?Cardiac diet ? ?DISCHARGE MEDICATION: ?Allergies as of 06/21/2021   ? ?   Reactions  ? Alendronate Sodium Nausea Only  ? GI upset  ? Zyrtec [cetirizine] Itching, Rash, Other (See Comments)  ? Tongue burning, thrush  ? Sertraline Hcl Other (See Comments)  ? unknown  ? ?  ? ?  ?  Medication List  ?  ? ?STOP taking these medications   ? ?escitalopram 10 MG tablet ?Commonly known as: LEXAPRO ?  ? ?  ? ?TAKE these medications   ? ?Advair Diskus 250-50 MCG/ACT Aepb ?Generic drug: fluticasone-salmeterol ?INHALE 1 PUFF INTO THE LUNGS 2 (TWO) TIMES DAILY AS NEEDED. ?What changed:  ?how much to take ?when to take this ?additional instructions ?  ?albuterol 108 (90 Base)  MCG/ACT inhaler ?Commonly known as: VENTOLIN HFA ?Inhale 2 puffs into the lungs every 6 (six) hours as needed for wheezing or shortness of breath. ?  ?allopurinol 300 MG tablet ?Commonly known as: ZYLOPRIM ?Take 1 tablet (300 mg total) by mouth daily. ?  ?ALPRAZolam 0.25 MG tablet ?Commonly known as: Prudy Feeler ?TAKE 1 TABLET BY MOUTH AT BEDTIME AS NEEDED FOR ANXIETY. ?What changed: See the new instructions. ?  ?aspirin EC 81 MG tablet ?Take 1 tablet (81 mg total) by mouth daily. ?  ?atorvastatin 20 MG tablet ?Commonly known as: Lipitor ?Take 1 tablet (20 mg total) by mouth daily. ?  ?[facility] VITAMIN B12 1000 MCG tablet ?Generic drug: cyanocobalamin ?TAKE 1 TABLET BY MOUTH EVERY DAY ?  ?potassium chloride 10 MEQ tablet ?Commonly known as: KLOR-CON ?Take 1 tablet (10 mEq total) by mouth daily for 7 days. ?  ?predniSONE 10 MG tablet ?Commonly known as: DELTASONE ?3 tabs daily for 2 days 2 tabs daily for 2 days 1 tab daily for 2 days ?  ?Thera-D 2000 50 MCG (2000 UT) Tabs ?Generic drug: Cholecalciferol ?1 tab by mouth once daily ?  ?triamterene-hydrochlorothiazide 37.5-25 MG tablet ?Commonly known as: MAXZIDE-25 ?TAKE 1 TABLET BY MOUTH EVERY DAY ?  ? ?  ? ? Follow-up Information   ? ? Corwin Levins, MD Follow up in 1 week(s).   ?Specialties: Internal Medicine, Radiology ?Contact information: ?[address] ?[place] [address] ?[phone] ? ? ?  ?  ? ?  ?  ? ?  ? ? ?Discharge Exam: ?Filed Weights  ? 06/19/21 1138 06/20/21 1418  ?Weight: 49.9 kg 48.9 kg  ? ?General: Looks fairly comfortable.  Intermittently coughing and slightly anxious when coughing. ?Cardiovascular: S1-S2 normal.  Regular rate rhythm. ?Respiratory: Bilateral clear.  She does have some nagging cough while taking deep breaths.  No wheezing or crepitations. ?Gastrointestinal: Soft.  Nontender.  Bowel sounds present. ?Ext: No edema or cyanosis. ? ? ?Condition at discharge: good ? ?The results of significant diagnostics from this hospitalization (including  imaging, microbiology, ancillary and laboratory) are listed below for reference.  ? ?Imaging Studies: ?DG Chest 2 View ? ?Result Date: 06/19/2021 ?CLINICAL DATA:  Short of breath.  History of COPD EXAM: CHEST - 2 VIEW COMPARISON:  04/10/2020 FINDINGS: COPD with pulmonary hyperinflation. Lungs are clear without infiltrate or effusion. Heart size and vascularity normal. Mild apical scarring bilaterally. IMPRESSION: No active cardiopulmonary disease. Electronically Signed   By: Marlan Palau M.D.   On: 06/19/2021 11:05   ? ?Microbiology: ?Results for orders placed or performed during the hospital encounter of 02/16/11  ?Urine culture     Status: None  ? Collection Time: 02/16/11  1:27 AM  ? Specimen: Urine, Random  ?Result Value Ref Range Status  ? Specimen Description URINE, RANDOM  Final  ? Special Requests ADDED AT 0307  Final  ? Culture  Setup Time [id number]  Final  ? Colony Count NO GROWTH  Final  ? Culture NO GROWTH  Final  ? Report Status 02/17/2011 FINAL  Final  ? ? ?Labs: ?  CBC: ?Recent Labs  ?Lab 06/19/21 ?1046 06/19/21 ?2107 06/20/21 ?6301  ?WBC 8.5  --  9.3  ?NEUTROABS 6.0  --   --   ?HGB 13.5 11.9* 11.6*  ?HCT 39.5 35.0* 34.1*  ?MCV 103.4*  --  104.3*  ?PLT 260  --  297  ? ?Basic Metabolic Panel: ?Recent Labs  ?Lab 06/19/21 ?1046 06/19/21 ?2107 06/20/21 ?6010 06/21/21 ?0454  ?NA 137 135 135 139  ?K 2.9* 4.0 4.5 3.3*  ?CL 104  --  106 108  ?CO2 18*  --  17* 22  ?GLUCOSE 94  --  143* 98  ?BUN 20  --  35* 34*  ?CREATININE 1.15*  --  1.61* 1.20*  ?CALCIUM 9.3  --  9.2 9.2  ?MG  --   --  1.8  --   ?PHOS  --   --  1.9*  --   ? ?Liver Function Tests: ?Recent Labs  ?Lab 06/19/21 ?1046  ?AST 50*  ?ALT 21  ?ALKPHOS 133*  ?BILITOT 1.0  ?PROT 8.2*  ?ALBUMIN 3.9  ? ?CBG: ?Recent Labs  ?Lab 06/20/21 ?2030  ?GLUCAP 124*  ? ? ?Discharge time spent: greater than 30 minutes. ? ?Signed: ?Dorcas Carrow, MD ?[facility] ?06/21/2021 ? ? ? ? ? ? ? ? ?

## 2021-06-21 NOTE — Progress Notes (Signed)
DISCHARGE NOTE HOME ?Avelino Leeds to be discharged Home per MD order. Discussed prescriptions and follow up appointments with the patient. Prescriptions given to patient; medication list explained in detail. Patient verbalized understanding. ? ?Skin clean, dry and intact without evidence of skin break down, no evidence of skin tears noted. IV catheter discontinued intact. Site without signs and symptoms of complications. Dressing and pressure applied. Pt denies pain at the site currently. No complaints noted. ? ?Patient free of lines, drains, and wounds.  ? ?An After Visit Summary (AVS) was printed and given to the patient. ?Patient escorted via wheelchair, and discharged home via private auto. ? ?Lajoyce Lauber, RN  ?

## 2021-06-21 NOTE — TOC Transition Note (Signed)
Transition of Care (TOC) - CM/SW Discharge Note ? ? ?Patient Details  ?Name: Andrea [name] ?MRN: [id number] ?Date of Birth: 1951-02-05 ? ?Transition of Care (TOC) CM/SW Contact:  ?Tom-Johnson, Hershal Coria, RN ?Phone Number: ?06/21/2021, 12:07 PM ? ? ?Clinical Narrative:    ? ?Patient is scheduled for discharge today. Admitted for Acute Respiratory Failure with Hypoxia. From home with husband. Has two supportive adult children. Currently retired. Independent with care and drive self prior to hospitalization. Has a cane, walker, shower seat and rollator at home. PCP is Corwin Levins, MD and uses [facility] on [address] No recommendations noted. . States she has been taking her breathing treatments and did not miss any. States the only medication she stopped taking was a new prescription which has many side effects and it's not her breathing treatment. Denies any TOC needs. Husband to transport at discharge. No further TOC needs noted. ? ? ?Final next level of care: Home/Self Care ?Barriers to Discharge: Barriers Resolved ? ? ?Patient Goals and CMS Choice ?Patient states their goals for this hospitalization and ongoing recovery are:: To return home ?CMS Medicare.gov Compare Post Acute Care list provided to:: Patient ?Choice offered to / list presented to : NA ? ?Discharge Placement ?  ?           ?  ?  ?  ?  ? ?Discharge Plan and Services ?  ?  ?           ?DME Arranged: N/A ?DME Agency: NA ?  ?  ?  ?HH Arranged: NA ?HH Agency: NA ?  ?  ?  ? ?Social Determinants of Health (SDOH) Interventions ?  ? ? ?Readmission Risk Interventions ?No flowsheet data found. ? ? ? ? ?

## 2021-06-21 NOTE — TOC CAGE-AID Note (Signed)
Transition of Care (TOC) - CAGE-AID Screening ? ? ?Patient Details  ?Name: Andrea [name] ?MRN: [id number] ?Date of Birth: 06/17/1950 ? ?Transition of Care (TOC) CM/SW Contact:    ?ReAndra F Blade, LCSWA ?Phone Number: ?06/21/2021, 12:25 PM ? ? ?Clinical Narrative: ?CSW met with patient and completed CAGE-AID.  Patient reports willingness to abstain from alcohol use.  CSW offered resources.  Patient refused and reports wanting to quit on her own "with the help of the Lord". ? ? ?CAGE-AID Screening: ?  ? ?Have You Ever Felt You Ought to Cut Down on Your Drinking or Drug Use?: Yes ?Have People Annoyed You By Critizing Your Drinking Or Drug Use?: Yes ?Have You Felt Bad Or Guilty About Your Drinking Or Drug Use?: Yes ?Have You Ever Had a Drink or Used Drugs First Thing In The Morning to Steady Your Nerves or to Get Rid of a Hangover?: No ?CAGE-AID Score: 3 ? ?Substance Abuse Education Offered: Yes ? ?Substance abuse interventions: Patient Counseling ? ? ? ? ? ? ?

## 2021-06-21 NOTE — Plan of Care (Signed)
?  Problem: Education: ?Goal: Knowledge of disease or condition will improve ?06/21/2021 1219 by Sheppard Penton, RN ?Outcome: Adequate for Discharge ?06/21/2021 1219 by Sheppard Penton, RN ?Outcome: Adequate for Discharge ?Goal: Knowledge of the prescribed therapeutic regimen will improve ?06/21/2021 1219 by Sheppard Penton, RN ?Outcome: Adequate for Discharge ?06/21/2021 1219 by Sheppard Penton, RN ?Outcome: Adequate for Discharge ?Goal: Individualized Educational Video(s) ?06/21/2021 1219 by Sheppard Penton, RN ?Outcome: Adequate for Discharge ?06/21/2021 1219 by Sheppard Penton, RN ?Outcome: Adequate for Discharge ?  ?

## 2021-06-25 ENCOUNTER — Telehealth: Payer: Self-pay

## 2021-06-25 NOTE — Telephone Encounter (Signed)
Transition Care Management Follow-up Telephone Call ?Date of discharge and from where: TCM DC Andrea [name] 06-21-21 Dx: acute respiratory failure with hypoxia secondary to COPD exac ?How have you been since you were released from the hospital? Doing ok ?Any questions or concerns? No ? ?Items Reviewed: ?Did the pt receive and understand the discharge instructions provided? Yes  ?Medications obtained and verified? Yes  ?Other? no ?Any new allergies since your discharge? No  ?Dietary orders reviewed? Yes ?Do you have support at home? Yes  ? ?Home Care and Equipment/Supplies: ?Were home health services ordered? no ?If so, what is the name of the agency? na  ?Has the agency set up a time to come to the patient's home? not applicable ?Were any new equipment or medical supplies ordered?  No ?What is the name of the medical supply agency? na ?Were you able to get the supplies/equipment? not applicable ?Do you have any questions related to the use of the equipment or supplies? No ? ?Functional Questionnaire: (I = Independent and D = Dependent) ?ADLs: I ? ?Bathing/Dressing- I ? ?Meal Prep- I ? ?Eating- I ? ?Maintaining continence- I ? ?Transferring/Ambulation- I ? ?Managing Meds- I ? ?Follow up appointments reviewed: ? ?PCP Hospital f/u appt confirmed? Yes  Scheduled to see Dr Jonny Ruiz  on 06-28-21 @ 1120am. ?Specialist Hospital f/u appt confirmed? No . ?Are transportation arrangements needed? No  ?If their condition worsens, is the pt aware to call PCP or go to the Emergency Dept.? Yes ?Was the patient provided with contact information for the PCP's office or ED? Yes ?Was to pt encouraged to call back with questions or concerns? Yes  ?

## 2021-06-28 ENCOUNTER — Encounter: Payer: Self-pay | Admitting: Internal Medicine

## 2021-06-28 ENCOUNTER — Other Ambulatory Visit: Payer: Self-pay

## 2021-06-28 ENCOUNTER — Ambulatory Visit (INDEPENDENT_AMBULATORY_CARE_PROVIDER_SITE_OTHER): Payer: Medicare Other | Admitting: Internal Medicine

## 2021-06-28 VITALS — BP 124/60 | HR 84 | Temp 97.8°F | Ht 66.0 in | Wt 109.0 lb

## 2021-06-28 DIAGNOSIS — I10 Essential (primary) hypertension: Secondary | ICD-10-CM | POA: Diagnosis not present

## 2021-06-28 DIAGNOSIS — J9601 Acute respiratory failure with hypoxia: Secondary | ICD-10-CM

## 2021-06-28 DIAGNOSIS — E876 Hypokalemia: Secondary | ICD-10-CM | POA: Diagnosis not present

## 2021-06-28 DIAGNOSIS — J441 Chronic obstructive pulmonary disease with (acute) exacerbation: Secondary | ICD-10-CM | POA: Diagnosis not present

## 2021-06-28 MED ORDER — DULERA 200-5 MCG/ACT IN AERO: 2.0000 | INHALATION_SPRAY | Freq: Two times a day (BID) | RESPIRATORY_TRACT | 11 refills | Status: DC

## 2021-06-28 NOTE — Progress Notes (Signed)
Patient ID: Andrea [name], adult   DOB: Apr 21, 1951, 71 y.o.   MRN: [id number] ? ? ? ?    Chief Complaint: follow up post hospn for copd exacerbation ? ?     HPI:  Andrea [name] is a 71 y.o. adult here after hospn feb 28 - mar 2 with copd exac with hypoxia requiring o2 despite home albuterol, and had not been taking advair.  Treated in usual fashion with IV to oral steroid tx, bronchodilator, o2 2L, azithromycin.  Able to be weaned off o2, and then for d/c on albuterol prn, and dulera.  Post d/c has done well, finished oral prednisone, conts on dulera bid though advair listed on d/c meds, and without fever, chills, worsening cough or sob/wheezing and Pt denies chest pain, increased sob or doe, wheezing, orthopnea, PND, increased LE swelling, palpitations, dizziness or syncope.   Pt denies polydipsia, polyuria, or new focal neuro s/s.   Pt denies recent worsening wt loss, night sweats, loss of appetite, or other constitutional symptoms  Pt asks for pulmonary referral in f/u as well.  No other new complaints.  Did have low K at d/c and has several days of her 7 days oral K supplement left to take ? ?Transitional Care Management elements noted today: ?1)  Date of D/C: as above ?2)  Medication reconciliation:  done today at end visit ?3)  Review of D/C summary or other information:  done today ?4)  Review of need for f/u on pending diagnostic tests and treatments:  done today ?5)  Review of need for Interaction with other providers who will assume or resume care of pt specific problems: done today ?6)  Education of patient/family/guardian or caregiver: done today - husband with her ? ?Wt Readings from Last 3 Encounters:  ?06/28/21 109 lb (49.4 kg)  ?06/20/21 107 lb 11.2 oz (48.9 kg)  ?04/13/21 110 lb (49.9 kg)  ? ?BP Readings from Last 3 Encounters:  ?06/28/21 124/60  ?06/21/21 105/65  ?06/19/21 139/83  ? ?      ?Past Medical History:  ?Diagnosis Date  ? Allergic rhinitis, cause unspecified 08/07/2012  ?  COPD 03/23/2008  ? Cough 11/05/2007  ? HYPERTENSION 02/10/2007  ? MUSCLE CRAMPS 08/09/2009  ? OSTEOPOROSIS 02/10/2007  ? Other and unspecified hyperlipidemia 08/07/2012  ? PEPTIC ULCER DISEASE 03/23/2008  ? PNEUMONIA, RIGHT UPPER LOBE 03/23/2008  ? RESTLESS LEG SYNDROME 03/23/2008  ? Unspecified disorder of thyroid 11/16/2007  ? ?Past Surgical History:  ?Procedure Laterality Date  ? Breast Biopsy negative  1972  ? BREAST LUMPECTOMY Right   ? ? reports that she has quit smoking. She has never used smokeless tobacco. She reports current alcohol use. She reports that she does not use drugs. ?family history includes Breast cancer (age of onset: 15) in her daughter; Leukemia in her father. ?Allergies  ?Allergen Reactions  ? Alendronate Sodium Nausea Only  ?  GI upset  ? Zyrtec [Cetirizine] Itching, Rash and Other (See Comments)  ?  Tongue burning, thrush  ? Sertraline Hcl Other (See Comments)  ?  unknown  ? ?Current Outpatient Medications on File Prior to Visit  ?Medication Sig Dispense Refill  ? albuterol (VENTOLIN HFA) 108 (90 Base) MCG/ACT inhaler Inhale 2 puffs into the lungs every 6 (six) hours as needed for wheezing or shortness of breath. 36 g 3  ? allopurinol (ZYLOPRIM) 300 MG tablet Take 1 tablet (300 mg total) by mouth daily. 90 tablet 3  ? ALPRAZolam (XANAX) 0.25 MG tablet  TAKE 1 TABLET BY MOUTH AT BEDTIME AS NEEDED FOR ANXIETY. (Patient taking differently: Take 0.25 mg by mouth daily.) 90 tablet 1  ? aspirin EC 81 MG tablet Take 1 tablet (81 mg total) by mouth daily. 30 tablet 5  ? atorvastatin (LIPITOR) 20 MG tablet Take 1 tablet (20 mg total) by mouth daily. 90 tablet 3  ? Cholecalciferol (THERA-D 2000) 50 MCG (2000 UT) TABS 1 tab by mouth once daily 90 tablet 99  ? [facility] VITAMIN B12 1000 MCG tablet TAKE 1 TABLET BY MOUTH EVERY DAY 90 tablet 1  ? potassium chloride (KLOR-CON) 10 MEQ tablet Take 1 tablet (10 mEq total) by mouth daily for 7 days. 7 tablet 0  ? triamterene-hydrochlorothiazide (MAXZIDE-25) 37.5-25 MG  tablet TAKE 1 TABLET BY MOUTH EVERY DAY 90 tablet 3  ? ?No current facility-administered medications on file prior to visit.  ? ?     ROS:  All others reviewed and negative. ? ?Objective  ? ?     PE:  BP 124/60 (BP Location: Right Arm, Patient Position: Sitting, Cuff Size: Normal)   Pulse 84   Temp 97.8 ?F (36.6 ?C) (Oral)   Ht 5\' 6"  (1.676 m)   Wt 109 lb (49.4 kg)   SpO2 98%   BMI 17.59 kg/m?  ? ?              Constitutional: Pt appears in NAD ?              HENT: Head: NCAT.  ?              Right Ear: External ear normal.   ?              Left Ear: External ear normal.  ?              Eyes: . Pupils are equal, round, and reactive to light. Conjunctivae and EOM are normal ?              Nose: without d/c or deformity ?              Neck: Neck supple. Gross normal ROM ?              Cardiovascular: Normal rate and regular rhythm.   ?              Pulmonary/Chest: Effort normal and breath sounds without rales or wheezing.  ?              Abd:  Soft, NT, ND, + BS, no organomegaly ?              Neurological: Pt is alert. At baseline orientation, motor grossly intact ?              Skin: Skin is warm. No rashes, no other new lesions, LE edema - none ?              Psychiatric: Pt behavior is normal without agitation  ? ?Micro: none ? ?Cardiac tracings I have personally interpreted today:  none ? ?Pertinent Radiological findings (summarize): none  ? ?Lab Results  ?Component Value Date  ? WBC 9.3 06/20/2021  ? HGB 11.6 (L) 06/20/2021  ? HCT 34.1 (L) 06/20/2021  ? PLT 297 06/20/2021  ? GLUCOSE 98 06/21/2021  ? CHOL 153 04/13/2021  ? TRIG (H) 04/13/2021  ?  461.0 Triglyceride is over 400; calculations on Lipids are invalid.  ? HDL 61.70 04/13/2021  ? LDLDIRECT 45.0  04/13/2021  ? LDLCALC 114 (H) 04/10/2020  ? ALT 21 06/19/2021  ? AST 50 (H) 06/19/2021  ? NA 139 06/21/2021  ? K 3.3 (L) 06/21/2021  ? CL 108 06/21/2021  ? CREATININE 1.20 (H) 06/21/2021  ? BUN 34 (H) 06/21/2021  ? CO2 22 06/21/2021  ? TSH 0.69 10/12/2020   ? INR 1.00 02/11/2014  ? HGBA1C 5.7 (H) 02/12/2014  ? ?Assessment/Plan:  ?Andrea [name] is a 71 y.o. Black or African American [2] adult with  has a past medical history of Allergic rhinitis, cause unspecified (08/07/2012), COPD (03/23/2008), Cough (11/05/2007), HYPERTENSION (02/10/2007), MUSCLE CRAMPS (08/09/2009), OSTEOPOROSIS (02/10/2007), Other and unspecified hyperlipidemia (08/07/2012), PEPTIC ULCER DISEASE (03/23/2008), PNEUMONIA, RIGHT UPPER LOBE (03/23/2008), RESTLESS LEG SYNDROME (03/23/2008), and Unspecified disorder of thyroid (11/16/2007). ? ?Hypokalemia ?Pt to finish her 7 days oral K supplement, then check bmp next wk at [facility] lab ? ?COPD with acute exacerbation (HCC) ?Nicely improved, ok to continue the albuterol prn, but also the dulera as she had this leftover post hospn and doing well; ok to change from advair unless not affordable; ok for pulm referral per pt request ? ?Acute respiratory failure with hypoxia (HCC) ?Resolved,  to f/u any worsening symptoms or concerns ? ?Essential hypertension ?BP Readings from Last 3 Encounters:  ?06/28/21 124/60  ?06/21/21 105/65  ?06/19/21 139/83  ? ?Stable, pt to continue medical treatment maxide ? ?Followup: Return in about 6 months (around 12/29/2021). ? ?Oliver Barre, MD 06/29/2021 4:51 AM ?[facility] ?[facility] ?Internal Medicine ?

## 2021-06-28 NOTE — Patient Instructions (Addendum)
Please continue all other medications as before, including the [facility] refill today ? ?Please have the pharmacy call with any other refills you may need. ? ?Please continue your efforts at being more active, low cholesterol diet, and weight control. ? ?You are otherwise up to date with prevention measures today. ? ?Please keep your appointments with your specialists as you may have planned ? ?You will be contacted regarding the referral for: Pulmonary ? ?Please go to the LAB at the blood drawing area for the tests to be done  - at the [facility] next week (no appt needed) ? ?You will be contacted by phone if any changes need to be made immediately.  Otherwise, you will receive a letter about your results with an explanation, but please check with MyChart first. ? ?Please remember to sign up for MyChart if you have not done so, as this will be important to you in the future with finding out test results, communicating by private email, and scheduling acute appointments online when needed. ? ?Please make an Appointment to return in 6 months, or sooner if needed ?

## 2021-06-29 ENCOUNTER — Other Ambulatory Visit: Payer: Self-pay

## 2021-06-29 ENCOUNTER — Encounter: Payer: Self-pay | Admitting: Internal Medicine

## 2021-06-29 DIAGNOSIS — E7800 Pure hypercholesterolemia, unspecified: Secondary | ICD-10-CM

## 2021-06-29 MED ORDER — ATORVASTATIN CALCIUM 20 MG PO TABS: 20.0000 mg | ORAL_TABLET | Freq: Every day | ORAL | 3 refills | Status: DC

## 2021-06-29 NOTE — Assessment & Plan Note (Signed)
Resolved,  to f/u any worsening symptoms or concerns  

## 2021-06-29 NOTE — Assessment & Plan Note (Signed)
Nicely improved, ok to continue the albuterol prn, but also the dulera as she had this leftover post hospn and doing well; ok to change from advair unless not affordable; ok for pulm referral per pt request ?

## 2021-06-29 NOTE — Assessment & Plan Note (Signed)
BP Readings from Last 3 Encounters:  ?06/28/21 124/60  ?06/21/21 105/65  ?06/19/21 139/83  ? ?Stable, pt to continue medical treatment maxide ? ?

## 2021-06-29 NOTE — Assessment & Plan Note (Signed)
Pt to finish her 7 days oral K supplement, then check bmp next wk at [facility] lab ?

## 2021-07-04 ENCOUNTER — Other Ambulatory Visit: Payer: Self-pay | Admitting: Internal Medicine

## 2021-07-04 ENCOUNTER — Other Ambulatory Visit (INDEPENDENT_AMBULATORY_CARE_PROVIDER_SITE_OTHER): Payer: Medicare Other

## 2021-07-04 DIAGNOSIS — Z1231 Encounter for screening mammogram for malignant neoplasm of breast: Secondary | ICD-10-CM

## 2021-07-04 DIAGNOSIS — E876 Hypokalemia: Secondary | ICD-10-CM | POA: Diagnosis not present

## 2021-07-04 LAB — BASIC METABOLIC PANEL
BUN: 26 mg/dL — ABNORMAL HIGH (ref 6–23)
CO2: 26 mEq/L (ref 19–32)
Calcium: 10.8 mg/dL — ABNORMAL HIGH (ref 8.4–10.5)
Chloride: 105 mEq/L (ref 96–112)
Creatinine, Ser: 1.03 mg/dL (ref 0.40–1.20)
GFR: 55.33 mL/min — ABNORMAL LOW (ref >60.00)
Glucose, Bld: 90 mg/dL (ref 70–99)
Potassium: 4.1 mEq/L (ref 3.5–5.1)
Sodium: 138 mEq/L (ref 135–145)

## 2021-07-10 ENCOUNTER — Telehealth: Payer: Self-pay | Admitting: Internal Medicine

## 2021-07-10 MED ORDER — FLUTICASONE-SALMETEROL 250-50 MCG/ACT IN AEPB: 1.0000 | INHALATION_SPRAY | Freq: Two times a day (BID) | RESPIRATORY_TRACT | 3 refills | Status: DC

## 2021-07-10 NOTE — Telephone Encounter (Signed)
Patient calling in ? ?Patient says the rx mometasone-formoterol (DULERA) 200-5 MCG/ACT AERO is too expensive OOP ? ?Wants to know if provider would be willing to place her back on the ADVAIR DISKUS 250-50 MCG/ACT AEPB ? ?Please fu [phone] ?

## 2021-07-10 NOTE — Telephone Encounter (Signed)
[facility] 

## 2021-08-03 ENCOUNTER — Ambulatory Visit
Admission: RE | Admit: 2021-08-03 | Discharge: 2021-08-03 | Disposition: A | Discharge status: Home / Self Care | Payer: Medicare Other | Source: Ambulatory Visit | Attending: Internal Medicine | Admitting: Internal Medicine

## 2021-08-03 DIAGNOSIS — Z1231 Encounter for screening mammogram for malignant neoplasm of breast: Secondary | ICD-10-CM

## 2021-08-16 ENCOUNTER — Encounter: Payer: Self-pay | Admitting: Student

## 2021-08-16 ENCOUNTER — Ambulatory Visit: Payer: Medicare Other | Admitting: Student

## 2021-08-16 VITALS — BP 132/80 | HR 118 | Temp 98.6°F | Ht 66.0 in | Wt 113.0 lb

## 2021-08-16 DIAGNOSIS — J449 Chronic obstructive pulmonary disease, unspecified: Secondary | ICD-10-CM | POA: Diagnosis not present

## 2021-08-16 NOTE — Patient Instructions (Addendum)
-   continue advair 1 puff twice daily, rinse mouth/gargle after each use ?- would recommend staying on an inhaler that has a steroid component due to your high eosinophil level ?- albuterol inhaler as needed ?- happy to see you again in clinic if advair eventually not enough to keep symptoms under control or to prevent COPD attacks ? ?

## 2021-08-16 NOTE — Progress Notes (Signed)
? ?Synopsis: Referred for COPD with acute exacerbation by Biagio Borg, MD ? ?Subjective:  ? ?PATIENT ID: Andrea [name] GENDER: female DOB: [birth year], MRN: [id number] ? ?Chief Complaint  ?Patient presents with  ? Pulmonary Consult  ?  Referred by Dr. Cathlean Cower. Pt states she was told she had COPD approx 16 years ago. She says she never had any issues with her breathing until Feb 2023 when had to be hospitalized with cough that would not go away. She states no longer coughing and her breathing is doing well. She has not used her albuterol inhaler recently.   ? ?71yF with history of AR, COPD, PUD, OP, RLS ? ?Recent AECOPD requiring hospitalization 2/28-3/2. Feeling back to normal at this point. No DOE. A little cough now but way better than she was feeling at time of hospitalization. First time she has ever needed steroids. Advair 1 puff BID, rinsing mouth after use.  ? ?Otherwise pertinent review of systems is negative. ? ?She has no family history of lung disease ? ?She is retired. Printed checks in the past. Smoked 40 py quit 16 ya. No vaping/MJ.  ? ?Past Medical History:  ?Diagnosis Date  ? Allergic rhinitis, cause unspecified 08/07/2012  ? COPD 03/23/2008  ? Cough 11/05/2007  ? HYPERTENSION 02/10/2007  ? MUSCLE CRAMPS 08/09/2009  ? OSTEOPOROSIS 02/10/2007  ? Other and unspecified hyperlipidemia 08/07/2012  ? PEPTIC ULCER DISEASE 03/23/2008  ? PNEUMONIA, RIGHT UPPER LOBE 03/23/2008  ? RESTLESS LEG SYNDROME 03/23/2008  ? Unspecified disorder of thyroid 11/16/2007  ?  ? ?Family History  ?Problem Relation Age of Onset  ? Heart disease Mother   ? Leukemia Father   ? Breast cancer Daughter 15  ?  ? ?Past Surgical History:  ?Procedure Laterality Date  ? Breast Biopsy negative  1972  ? BREAST EXCISIONAL BIOPSY Right   ? ? ?Social History  ? ?Socioeconomic History  ? Marital status: Married  ?  Spouse name: Not on file  ? Number of children: Not on file  ? Years of education: Not on file  ? Highest education level: Not on  file  ?Occupational History  ? Occupation: environmental services  ?Tobacco Use  ? Smoking status: Former  ?  Packs/day: 1.00  ?  Years: 39.00  ?  Pack years: 39.00  ?  Types: Cigarettes  ?  Quit date: 04/23/2007  ?  Years since quitting: 14.3  ? Smokeless tobacco: Never  ?Vaping Use  ? Vaping Use: Never used  ?Substance and Sexual Activity  ? Alcohol use: Yes  ? Drug use: No  ? Sexual activity: Yes  ?Other Topics Concern  ? Not on file  ?Social History Narrative  ? Not on file  ? ?Social Determinants of Health  ? ?Financial Resource Strain: Low Risk   ? Difficulty of Paying Living Expenses: Not hard at all  ?Food Insecurity: No Food Insecurity  ? Worried About Charity fundraiser in the Last Year: Never true  ? Ran Out of Food in the Last Year: Never true  ?Transportation Needs: No Transportation Needs  ? Lack of Transportation (Medical): No  ? Lack of Transportation (Non-Medical): No  ?Physical Activity: Sufficiently Active  ? Days of Exercise per Week: 5 days  ? Minutes of Exercise per Session: 30 min  ?Stress: No Stress Concern Present  ? Feeling of Stress : Not at all  ?Social Connections: Socially Integrated  ? Frequency of Communication with Friends and Family: Three times a week  ?  Frequency of Social Gatherings with Friends and Family: More than three times a week  ? Attends Religious Services: More than 4 times per year  ? Active Member of Clubs or Organizations: Yes  ? Attends Archivist Meetings: More than 4 times per year  ? Marital Status: Married  ?Intimate Partner Violence: Not At Risk  ? Fear of Current or Ex-Partner: No  ? Emotionally Abused: No  ? Physically Abused: No  ? Sexually Abused: No  ?  ? ?Allergies  ?Allergen Reactions  ? Alendronate Sodium Nausea Only  ?  GI upset  ? Zyrtec [Cetirizine] Itching, Rash and Other (See Comments)  ?  Tongue burning, thrush  ? Sertraline Hcl Other (See Comments)  ?  unknown  ?  ? ?Outpatient Medications Prior to Visit  ?Medication Sig Dispense Refill   ? albuterol (VENTOLIN HFA) 108 (90 Base) MCG/ACT inhaler Inhale 2 puffs into the lungs every 6 (six) hours as needed for wheezing or shortness of breath. 36 g 3  ? allopurinol (ZYLOPRIM) 300 MG tablet Take 1 tablet (300 mg total) by mouth daily. 90 tablet 3  ? ALPRAZolam (XANAX) 0.25 MG tablet TAKE 1 TABLET BY MOUTH AT BEDTIME AS NEEDED FOR ANXIETY. (Patient taking differently: Take 0.25 mg by mouth daily.) 90 tablet 1  ? aspirin EC 81 MG tablet Take 1 tablet (81 mg total) by mouth daily. 30 tablet 5  ? atorvastatin (LIPITOR) 20 MG tablet Take 1 tablet (20 mg total) by mouth daily. 90 tablet 3  ? Cholecalciferol (THERA-D 2000) 50 MCG (2000 UT) TABS 1 tab by mouth once daily 90 tablet 99  ? [facility] VITAMIN B12 1000 MCG tablet TAKE 1 TABLET BY MOUTH EVERY DAY 90 tablet 1  ? fluticasone-salmeterol (ADVAIR DISKUS) 250-50 MCG/ACT AEPB Inhale 1 puff into the lungs in the morning and at bedtime. 180 each 3  ? potassium chloride (KLOR-CON) 10 MEQ tablet Take 1 tablet (10 mEq total) by mouth daily for 7 days. 7 tablet 0  ? triamterene-hydrochlorothiazide (MAXZIDE-25) 37.5-25 MG tablet TAKE 1 TABLET BY MOUTH EVERY DAY 90 tablet 3  ? mometasone-formoterol (DULERA) 200-5 MCG/ACT AERO Inhale 2 puffs into the lungs 2 (two) times daily. 1 each 11  ? ?No facility-administered medications prior to visit.  ? ? ? ? ? ?Objective:  ? ?Physical Exam: ? ?General appearance: 71 y.o., adult, NAD, conversant  ?Eyes: anicteric sclerae; PERRL, tracking appropriately ?HENT: NCAT; MMM ?Neck: Trachea midline; no lymphadenopathy, no JVD ?Lungs: CTAB, no crackles, no wheeze, with normal respiratory effort ?CV: RRR, no murmur  ?Abdomen: Soft, non-tender; non-distended, BS present  ?Extremities: No peripheral edema, warm ?Skin: Normal turgor and texture; no rash ?Psych: Appropriate affect ?Neuro: Alert and oriented to person and place, no focal deficit  ? ? ? ?Vitals:  ? 08/16/21 1512  ?BP: 132/80  ?Pulse: (!) 118  ?Temp: 98.6 ?F (37 ?C)  ?TempSrc: Oral   ?SpO2: 98%  ?Weight: 113 lb (51.3 kg)  ?Height: 5\' 6"  (1.676 m)  ? ?98% on RA ?BMI Readings from Last 3 Encounters:  ?08/16/21 18.24 kg/m?  ?06/28/21 17.59 kg/m?  ?06/20/21 17.38 kg/m?  ? ?Wt Readings from Last 3 Encounters:  ?08/16/21 113 lb (51.3 kg)  ?06/28/21 109 lb (49.4 kg)  ?06/20/21 107 lb 11.2 oz (48.9 kg)  ? ? ? ?CBC ?   ?Component Value Date/Time  ? WBC 9.3 06/20/2021 0237  ? RBC 3.27 (L) 06/20/2021 0237  ? HGB 11.6 (L) 06/20/2021 0237  ? HCT 34.1 (  L) 06/20/2021 0237  ? PLT 297 06/20/2021 0237  ? MCV 104.3 (H) 06/20/2021 0237  ? MCH 35.5 (H) 06/20/2021 0237  ? MCHC 34.0 06/20/2021 0237  ? RDW 13.8 06/20/2021 0237  ? LYMPHSABS 1.0 06/19/2021 1046  ? MONOABS 0.6 06/19/2021 1046  ? EOSABS 0.7 (H) 06/19/2021 1046  ? BASOSABS 0.1 06/19/2021 1046  ? ? ?Eos 100-700 ? ?Chest Imaging: ?CXR 06/19/21 reviewed and remarkable for hyperinflation ? ?Pulmonary Functions Testing Results: ?   ? View : No data to display.  ?  ?  ?  ? ? ?Echocardiogram:  ? ?TTE 2015: ?- Left ventricle: The cavity size was normal. Wall thickness was  ?  normal. Systolic function was normal. The estimated ejection  ?  fraction was in the range of 50% to 55%. Wall motion was normal;  ?  there were no regional wall motion abnormalities. Doppler  ?  parameters are consistent with abnormal left ventricular  ?  relaxation (grade 1 diastolic dysfunction).  ?- Mitral valve: Mildly thickened leaflets .  ?- Right ventricle: Systolic function was mildly reduced.  ?- Atrial septum: No defect or patent foramen ovale was identified.  ?- Tricuspid valve: There was mild regurgitation.  ?- Pulmonic valve: There was mild regurgitation.  ? ?   ?Assessment & Plan:  ? ?# COPD gold functional group E: ?Doing well overall now on maintenance advair ? ?# History of smoking ? ?Plan: ?- continue advair 250 1 puff BID - with her high eosinophil count she would likely benefit from having ICS component to her maintenance inhaler strategy ?- albuterol prn ?- declines PFTs  at this time since she says she's feel well overall - if she has worsening symptom control or requires steroids again for exacerbation then at later date she would be open to getting PFTs  ?- does not q

## 2021-08-29 ENCOUNTER — Other Ambulatory Visit: Payer: Self-pay | Admitting: Internal Medicine

## 2021-08-29 NOTE — Telephone Encounter (Signed)
Please refill as per office routine med refill policy (all routine meds to be refilled for 3 mo or monthly (per pt preference) up to one year from last visit, then month to month grace period for 3 mo, then further med refills will have to be denied) ? ?

## 2021-08-31 ENCOUNTER — Other Ambulatory Visit: Payer: Self-pay | Admitting: Internal Medicine

## 2021-08-31 NOTE — Telephone Encounter (Signed)
Please refill as per office routine med refill policy (all routine meds to be refilled for 3 mo or monthly (per pt preference) up to one year from last visit, then month to month grace period for 3 mo, then further med refills will have to be denied) ? ?

## 2021-09-10 ENCOUNTER — Telehealth: Payer: Self-pay | Admitting: Internal Medicine

## 2021-09-10 NOTE — Telephone Encounter (Signed)
1.Medication Requested: ALPRAZolam (XANAX) 0.25 MG tablet 2. Pharmacy (Name, Street, [place]): [facility] [phone] - [place], [place] - [address] AT [facility] [address] & [facility] Phone:  [phone]  Fax:  [phone]     3. On Med List: yes   4. Last Visit with PCP:  5. Next visit date with PCP:   Agent: Please be advised that RX refills may take up to 3 business days. We ask that you follow-up with your pharmacy.

## 2021-09-11 NOTE — Telephone Encounter (Signed)
Pt is calling to check the status of the refill request.  I advised the pt that it can take up to 72 BUS hrs for refill request to be processed.  Please advise

## 2021-09-11 NOTE — Telephone Encounter (Signed)
Too soon - pt not due until at least about June 15

## 2021-09-12 MED ORDER — ALPRAZOLAM 0.25 MG PO TABS
0.2500 mg | ORAL_TABLET | Freq: Every day | ORAL | 1 refills | Status: DC
Start: 2021-09-12 — End: 2021-12-15

## 2021-09-12 NOTE — Telephone Encounter (Signed)
Pt is calling for a refill on ALPRAZolam (XANAX) 0.25 MG tablet.   [facility]. It was [facility]/24/21. Pt has one refill remaining but [facility] is [facility].   Pt is wanting a Rx sent to: [facility] [phone] - [place], [place] - [address] AT [facility] OF [address]

## 2021-09-12 NOTE — Addendum Note (Signed)
Addended by: Corwin Levins on: 09/12/2021 05:07 PM   Modules accepted: Orders

## 2021-12-15 ENCOUNTER — Other Ambulatory Visit: Payer: Self-pay | Admitting: Internal Medicine

## 2022-01-09 ENCOUNTER — Telehealth: Payer: Self-pay | Admitting: Internal Medicine

## 2022-01-09 DIAGNOSIS — E2839 Other primary ovarian failure: Secondary | ICD-10-CM

## 2022-01-09 NOTE — Telephone Encounter (Signed)
Please to assist pt with setting up appt at [facility] for DXA that she requested and I have ordered

## 2022-01-10 ENCOUNTER — Other Ambulatory Visit: Payer: Self-pay

## 2022-01-10 DIAGNOSIS — Z13820 Encounter for screening for osteoporosis: Secondary | ICD-10-CM

## 2022-01-10 NOTE — Telephone Encounter (Signed)
Routed to [place] pool for scheduling

## 2022-01-23 NOTE — Telephone Encounter (Signed)
Scheduled for 01/29/22

## 2022-01-29 ENCOUNTER — Ambulatory Visit (INDEPENDENT_AMBULATORY_CARE_PROVIDER_SITE_OTHER)
Admission: RE | Admit: 2022-01-29 | Discharge: 2022-01-29 | Disposition: A | Discharge status: Home / Self Care | Payer: Medicare Other | Source: Ambulatory Visit | Attending: Internal Medicine | Admitting: Internal Medicine

## 2022-01-29 DIAGNOSIS — E2839 Other primary ovarian failure: Secondary | ICD-10-CM

## 2022-03-15 ENCOUNTER — Other Ambulatory Visit: Payer: Self-pay | Admitting: Internal Medicine

## 2022-05-27 ENCOUNTER — Ambulatory Visit (INDEPENDENT_AMBULATORY_CARE_PROVIDER_SITE_OTHER): Payer: Medicare Other

## 2022-05-27 VITALS — Ht 66.0 in | Wt 110.0 lb

## 2022-05-27 DIAGNOSIS — Z0000 Encounter for general adult medical examination without abnormal findings: Secondary | ICD-10-CM | POA: Diagnosis not present

## 2022-05-27 DIAGNOSIS — Z1211 Encounter for screening for malignant neoplasm of colon: Secondary | ICD-10-CM

## 2022-05-27 NOTE — Patient Instructions (Addendum)
Andrea [name] , Thank you for taking time to come for your Medicare Wellness Visit. I appreciate your ongoing commitment to your health goals. Please review the following plan we discussed and let me know if I can assist you in the future.   These are the goals we discussed:  Goals      My goal for 2024 is to maintain my health.        This is a list of the screening recommended for you and due dates:  Health Maintenance  Topic Date Due   Zoster (Shingles) Vaccine (1 of 2) Never done   COVID-19 Vaccine (5 - 2023-24 season) 12/21/2021   Cologuard (Stool DNA test)  07/19/2022   Medicare Annual Wellness Visit  05/28/2023   Mammogram  08/04/2023   DTaP/Tdap/Td vaccine (2 - Td or Tdap) 05/15/2028   Pneumonia Vaccine  Completed   Flu Shot  Completed   DEXA scan (bone density measurement)  Completed   Hepatitis C Screening: USPSTF Recommendation to screen - Ages 71-79 yo.  Completed   HPV Vaccine  Aged Out   Colon Cancer Screening  Discontinued    Advanced directives: No; Advance directive discussed with you today. Even though you declined this today please call our office should you change your mind and we can give you the proper paperwork for you to fill out.  Conditions/risks identified: Yes  Next appointment: Follow up in one year for your annual wellness visit.   Preventive Care 56 Years and Older, Female Preventive care refers to lifestyle choices and visits with your health care provider that can promote health and wellness. What does preventive care include? A yearly physical exam. This is also called an annual well check. Dental exams once or twice a year. Routine eye exams. Ask your health care provider how often you should have your eyes checked. Personal lifestyle choices, including: Daily care of your teeth and gums. Regular physical activity. Eating a healthy diet. Avoiding tobacco and drug use. Limiting alcohol use. Practicing safe sex. Taking low-dose aspirin  every day. Taking vitamin and mineral supplements as recommended by your health care provider. What happens during an annual well check? The services and screenings done by your health care provider during your annual well check will depend on your age, overall health, lifestyle risk factors, and family history of disease. Counseling  Your health care provider may ask you questions about your: Alcohol use. Tobacco use. Drug use. Emotional well-being. Home and relationship well-being. Sexual activity. Eating habits. History of falls. Memory and ability to understand (cognition). Work and work Statistician. Reproductive health. Screening  You may have the following tests or measurements: Height, weight, and BMI. Blood pressure. Lipid and cholesterol levels. These may be checked every 5 years, or more frequently if you are over 44 years old. Skin check. Lung cancer screening. You may have this screening every year starting at age 71 if you have a 30-pack-year history of smoking and currently smoke or have quit within the past 15 years. Fecal occult blood test (FOBT) of the stool. You may have this test every year starting at age 71. Flexible sigmoidoscopy or colonoscopy. You may have a sigmoidoscopy every 5 years or a colonoscopy every 10 years starting at age 71. Hepatitis C blood test. Hepatitis B blood test. Sexually transmitted disease (STD) testing. Diabetes screening. This is done by checking your blood sugar (glucose) after you have not eaten for a while (fasting). You may have this done every 1-3 years.  Bone density scan. This is done to screen for osteoporosis. You may have this done starting at age 71. Mammogram. This may be done every 1-2 years. Talk to your health care provider about how often you should have regular mammograms. Talk with your health care provider about your test results, treatment options, and if necessary, the need for more tests. Vaccines  Your health  care provider may recommend certain vaccines, such as: Influenza vaccine. This is recommended every year. Tetanus, diphtheria, and acellular pertussis (Tdap, Td) vaccine. You may need a Td booster every 10 years. Zoster vaccine. You may need this after age 30. Pneumococcal 13-valent conjugate (PCV13) vaccine. One dose is recommended after age 71. Pneumococcal polysaccharide (PPSV23) vaccine. One dose is recommended after age 71. Talk to your health care provider about which screenings and vaccines you need and how often you need them. This information is not intended to replace advice given to you by your health care provider. Make sure you discuss any questions you have with your health care provider. Document Released: 05/05/2015 Document Revised: 12/27/2015 Document Reviewed: 02/07/2015 Elsevier Interactive Patient Education  2017 [place] Prevention in the Home Falls can cause injuries. They can happen to people of all ages. There are many things you can do to make your home safe and to help prevent falls. What can I do on the outside of my home? Regularly fix the edges of walkways and driveways and fix any cracks. Remove anything that might make you trip as you walk through a door, such as a raised step or threshold. Trim any bushes or trees on the path to your home. Use bright outdoor lighting. Clear any walking paths of anything that might make someone trip, such as rocks or tools. Regularly check to see if handrails are loose or broken. Make sure that both sides of any steps have handrails. Any raised decks and porches should have guardrails on the edges. Have any leaves, snow, or ice cleared regularly. Use sand or salt on walking paths during winter. Clean up any spills in your garage right away. This includes oil or grease spills. What can I do in the bathroom? Use night lights. Install grab bars by the toilet and in the tub and shower. Do not use towel bars as grab  bars. Use non-skid mats or decals in the tub or shower. If you need to sit down in the shower, use a plastic, non-slip stool. Keep the floor dry. Clean up any water that spills on the floor as soon as it happens. Remove soap buildup in the tub or shower regularly. Attach bath mats securely with double-sided non-slip rug tape. Do not have throw rugs and other things on the floor that can make you trip. What can I do in the bedroom? Use night lights. Make sure that you have a light by your bed that is easy to reach. Do not use any sheets or blankets that are too big for your bed. They should not hang down onto the floor. Have a firm chair that has side arms. You can use this for support while you get dressed. Do not have throw rugs and other things on the floor that can make you trip. What can I do in the kitchen? Clean up any spills right away. Avoid walking on wet floors. Keep items that you use a lot in easy-to-reach places. If you need to reach something above you, use a strong step stool that has a grab bar.  Keep electrical cords out of the way. Do not use floor polish or wax that makes floors slippery. If you must use wax, use non-skid floor wax. Do not have throw rugs and other things on the floor that can make you trip. What can I do with my stairs? Do not leave any items on the stairs. Make sure that there are handrails on both sides of the stairs and use them. Fix handrails that are broken or loose. Make sure that handrails are as long as the stairways. Check any carpeting to make sure that it is firmly attached to the stairs. Fix any carpet that is loose or worn. Avoid having throw rugs at the top or bottom of the stairs. If you do have throw rugs, attach them to the floor with carpet tape. Make sure that you have a light switch at the top of the stairs and the bottom of the stairs. If you do not have them, ask someone to add them for you. What else can I do to help prevent  falls? Wear shoes that: Do not have high heels. Have rubber bottoms. Are comfortable and fit you well. Are closed at the toe. Do not wear sandals. If you use a stepladder: Make sure that it is fully opened. Do not climb a closed stepladder. Make sure that both sides of the stepladder are locked into place. Ask someone to hold it for you, if possible. Clearly mark and make sure that you can see: Any grab bars or handrails. First and last steps. Where the edge of each step is. Use tools that help you move around (mobility aids) if they are needed. These include: Canes. Walkers. Scooters. Crutches. Turn on the lights when you go into a dark area. Replace any light bulbs as soon as they burn out. Set up your furniture so you have a clear path. Avoid moving your furniture around. If any of your floors are uneven, fix them. If there are any pets around you, be aware of where they are. Review your medicines with your doctor. Some medicines can make you feel dizzy. This can increase your chance of falling. Ask your doctor what other things that you can do to help prevent falls. This information is not intended to replace advice given to you by your health care provider. Make sure you discuss any questions you have with your health care provider. Document Released: 02/02/2009 Document Revised: 09/14/2015 Document Reviewed: 05/13/2014 Elsevier Interactive Patient Education  2017 [employer].

## 2022-05-27 NOTE — Progress Notes (Signed)
Virtual Visit via Telephone Note  I connected with  [name] on 05/27/22 at 11:00 AM EST by telephone and verified that I am speaking with the correct person using two identifiers.  Location: Patient: Home Provider: [place] Persons participating in the virtual visit: [place]   I discussed the limitations, risks, security and privacy concerns of performing an evaluation and management service by telephone and the availability of in person appointments. The patient expressed understanding and agreed to proceed.  Interactive audio and video telecommunications were attempted between this nurse and patient, however failed, due to patient having technical difficulties OR patient did not have access to video capability.  We continued and completed visit with audio only.  Some vital signs may be absent or patient reported.   Sheral Flow, LPN  Subjective:   Andrea [name] is a 72 y.o. female who presents for Medicare Annual (Subsequent) preventive examination.  Review of Systems     Cardiac Risk Factors include: advanced age (>24men, >98 women);dyslipidemia;hypertension;family history of premature cardiovascular disease     Objective:    Today's Vitals   05/27/22 1106  Weight: 110 lb (49.9 kg)  Height: 5\' 6"  (1.676 m)  PainSc: 0-No pain   Body mass index is 17.75 kg/m.     05/27/2022   11:09 AM 06/19/2021   11:40 AM 05/25/2021   10:45 AM 05/15/2018    5:58 AM 07/26/2016   10:18 AM 12/22/2015    3:39 AM 02/11/2014    7:30 PM  Advanced Directives  Does Patient Have a Medical Advance Directive? No No Yes No No No No  Type of Advance Directive   Living will;Healthcare Power of Attorney      Does patient want to make changes to medical advance directive?   No - Patient declined      Copy of [place] in Chart?   No - copy requested      Would patient like information on creating a medical advance directive? No - Patient  declined No - Patient declined  No - Patient declined   No - patient declined information    Current Medications (verified) Outpatient Encounter Medications as of 05/27/2022  Medication Sig   albuterol (VENTOLIN HFA) 108 (90 Base) MCG/ACT inhaler Inhale 2 puffs into the lungs every 6 (six) hours as needed for wheezing or shortness of breath.   allopurinol (ZYLOPRIM) 300 MG tablet TAKE 1 TABLET BY MOUTH EVERY DAY   ALPRAZolam (XANAX) 0.25 MG tablet TAKE 1 TABLET(0.25 MG) BY MOUTH DAILY   aspirin EC 81 MG tablet Take 1 tablet (81 mg total) by mouth daily.   atorvastatin (LIPITOR) 20 MG tablet Take 1 tablet (20 mg total) by mouth daily.   Cholecalciferol (THERA-D 2000) 50 MCG (2000 UT) TABS 1 tab by mouth once daily   [employer] VITAMIN B12 1000 MCG tablet TAKE 1 TABLET BY MOUTH EVERY DAY   fluticasone-salmeterol (ADVAIR DISKUS) 250-50 MCG/ACT AEPB Inhale 1 puff into the lungs in the morning and at bedtime.   potassium chloride (KLOR-CON) 10 MEQ tablet Take 1 tablet (10 mEq total) by mouth daily for 7 days.   triamterene-hydrochlorothiazide (MAXZIDE-25) 37.5-25 MG tablet TAKE 1 TABLET BY MOUTH EVERY DAY   No facility-administered encounter medications on file as of 05/27/2022.    Allergies (verified) Alendronate sodium, Zyrtec [cetirizine], and Sertraline hcl   History: Past Medical History:  Diagnosis Date   Allergic rhinitis, cause unspecified 08/07/2012   COPD 03/23/2008  Cough 11/05/2007   HYPERTENSION 02/10/2007   MUSCLE CRAMPS 08/09/2009   OSTEOPOROSIS 02/10/2007   Other and unspecified hyperlipidemia 08/07/2012   PEPTIC ULCER DISEASE 03/23/2008   PNEUMONIA, RIGHT UPPER LOBE 03/23/2008   RESTLESS LEG SYNDROME 03/23/2008   Unspecified disorder of thyroid 11/16/2007   Past Surgical History:  Procedure Laterality Date   Breast Biopsy negative  1972   BREAST EXCISIONAL BIOPSY Right    Family History  Problem Relation Age of Onset   Heart disease Mother    Leukemia Father    Breast cancer  Daughter 53   Social History   Socioeconomic History   Marital status: Married    Spouse name: Not on file   Number of children: Not on file   Years of education: Not on file   Highest education level: Not on file  Occupational History   Occupation: environmental services  Tobacco Use   Smoking status: Former    Packs/day: 1.00    Years: 39.00    Total pack years: 39.00    Types: Cigarettes    Quit date: 04/23/2007    Years since quitting: 15.1   Smokeless tobacco: Never  Vaping Use   Vaping Use: Never used  Substance and Sexual Activity   Alcohol use: Yes   Drug use: No   Sexual activity: Yes  Other Topics Concern   Not on file  Social History Narrative   Not on file   Social Determinants of Health   Financial Resource Strain: Low Risk  (05/27/2022)   Overall Financial Resource Strain (CARDIA)    Difficulty of Paying Living Expenses: Not hard at all  Food Insecurity: No Food Insecurity (05/27/2022)   Hunger Vital Sign    Worried About Running Out of Food in the Last Year: Never true    Ran Out of Food in the Last Year: Never true  Transportation Needs: No Transportation Needs (05/27/2022)   PRAPARE - Administrator, Civil Service (Medical): No    Lack of Transportation (Non-Medical): No  Physical Activity: Sufficiently Active (05/27/2022)   Exercise Vital Sign    Days of Exercise per Week: 5 days    Minutes of Exercise per Session: 30 min  Stress: No Stress Concern Present (05/27/2022)   [employer] of Occupational Health - Occupational Stress Questionnaire    Feeling of Stress : Not at all  Social Connections: Socially Integrated (05/27/2022)   Social Connection and Isolation Panel [NHANES]    Frequency of Communication with Friends and Family: Three times a week    Frequency of Social Gatherings with Friends and Family: More than three times a week    Attends Religious Services: More than 4 times per year    Active Member of Clubs or Organizations: Yes     Attends Engineer, structural: More than 4 times per year    Marital Status: Married    Tobacco Counseling Counseling given: Not Answered   Clinical Intake:  Pre-visit preparation completed: Yes  Pain : 0-10 Pain Score: 0-No pain     BMI - recorded: 17.75 Nutritional Status: BMI <19  Underweight Nutritional Risks: None Diabetes: No  How often do you need to have someone help you when you read instructions, pamphlets, or other written materials from your doctor or pharmacy?: 1 - Never What is the last grade level you completed in school?: HSG  Diabetic? No  Interpreter Needed?: No  Information entered by :: Susie Cassette, LPN.   Activities of Daily  Living    05/27/2022   11:11 AM 06/20/2021   12:00 PM  In your present state of health, do you have any difficulty performing the following activities:  Hearing? 0 0  Vision? 0 0  Difficulty concentrating or making decisions? 0 0  Walking or climbing stairs? 0 0  Dressing or bathing? 0 0  Doing errands, shopping? 0 0  Preparing Food and eating ? N   Using the Toilet? N   In the past six months, have you accidently leaked urine? N   Do you have problems with loss of bowel control? N   Managing your Medications? N   Managing your Finances? N   Housekeeping or managing your Housekeeping? N     Patient Care Team: Biagio Borg, MD as PCP - Olivia Mackie, [facility], [place] as Consulting Physician (Optometry)  Indicate any recent Medical Services you may have received from other than [facility] providers in the past year (date may be approximate).     Assessment:   This is a routine wellness examination for [place].  Hearing/Vision screen Hearing Screening - Comments:: Denies hearing difficulties   Vision Screening - Comments:: Wears rx glasses - up to date with routine eye exams with [facility], [facility].   Dietary issues and exercise activities discussed: Current Exercise Habits: Home exercise routine, Type of  exercise: walking, Time (Minutes): 30, Frequency (Times/Week): 5, Weekly Exercise (Minutes/Week): 150, Intensity: Moderate   Goals Addressed             This Visit's Progress    My goal for 2024 is to maintain my health.        Depression Screen    05/27/2022   11:11 AM 05/25/2021   10:52 AM 10/12/2020    9:59 AM 04/10/2020   10:35 AM 07/01/2019   10:50 AM 06/25/2018   11:06 AM 02/26/2017   11:07 AM  PHQ 2/9 Scores  PHQ - 2 Score 0 0 1 0 1 0 0    Fall Risk    05/27/2022   11:10 AM 05/25/2021   10:47 AM 10/12/2020   10:36 AM 10/12/2020    9:58 AM 07/01/2019   10:50 AM  Fall Risk   Falls in the past year? 0 0 0 0 0  Number falls in past yr: 0 0 0 0   Injury with Fall? 0 0  0   Risk for fall due to : No Fall Risks No Fall Risks     Follow up Falls prevention discussed Falls evaluation completed       FALL RISK PREVENTION PERTAINING TO THE HOME:  Any stairs in or around the home? Yes  If so, are there any without handrails? No  Home free of loose throw rugs in walkways, pet beds, electrical cords, etc? Yes  Adequate lighting in your home to reduce risk of falls? Yes   ASSISTIVE DEVICES UTILIZED TO PREVENT FALLS:  Life alert? No  Use of a cane, walker or w/c? No  Grab bars in the bathroom? Yes  Shower chair or bench in shower? Yes  Elevated toilet seat or a handicapped toilet? Yes   TIMED UP AND GO:  Was the test performed? No . Phone Visit  Cognitive Function:        05/27/2022   11:10 AM  6CIT Screen  What Year? 0 points  What month? 0 points  What time? 0 points  Count back from 20 0 points  Months in reverse 0 points  Repeat phrase 0 points  Total Score 0 points    Immunizations Immunization History  Administered Date(s) Administered   Fluad Quad(high Dose 65+) 12/16/2018   Hepatitis B, adult 04/05/2014, 12/07/2014   Influenza Whole 01/20/2009   Influenza, High Dose Seasonal PF 01/25/2021   Influenza,inj,Quad PF,6+ Mos 02/24/2014    Influenza-Unspecified 01/20/2017, 02/22/2020   PFIZER Comirnaty(Gray Top)Covid-19 Tri-Sucrose Vaccine 07/26/2020   PFIZER(Purple Top)SARS-COV-2 Vaccination 05/13/2019, 06/02/2019, 01/21/2020   Pneumococcal Conjugate-13 04/09/2017   Pneumococcal Polysaccharide-23 08/09/2009, 06/25/2018   Tdap 05/15/2018    TDAP status: Up to date  Flu Vaccine status: Up to date  Pneumococcal vaccine status: Up to date  Covid-19 vaccine status: Completed vaccines  Qualifies for Shingles Vaccine? Yes   Zostavax completed No   Shingrix Completed?: No.    Education has been provided regarding the importance of this vaccine. Patient has been advised to call insurance company to determine out of pocket expense if they have not yet received this vaccine. Advised may also receive vaccine at local pharmacy or Health Dept. Verbalized acceptance and understanding.  Screening Tests Health Maintenance  Topic Date Due   Zoster Vaccines- Shingrix (1 of 2) Never done   COVID-19 Vaccine (5 - 2023-24 season) 12/21/2021   Fecal DNA (Cologuard)  07/19/2022   Medicare Annual Wellness (AWV)  05/28/2023   MAMMOGRAM  08/04/2023   DTaP/Tdap/Td (2 - Td or Tdap) 05/15/2028   Pneumonia Vaccine 69+ Years old  Completed   INFLUENZA VACCINE  Completed   DEXA SCAN  Completed   Hepatitis C Screening  Completed   HPV VACCINES  Aged Out   COLONOSCOPY (Pts 45-19yrs Insurance coverage will need to be confirmed)  Discontinued    Health Maintenance  Health Maintenance Due  Topic Date Due   Zoster Vaccines- Shingrix (1 of 2) Never done   COVID-19 Vaccine (5 - 2023-24 season) 12/21/2021    Colorectal cancer screening: Type of screening: Cologuard. Completed 07/19/2019. Repeat every 3 years  Mammogram status: Completed 08/03/2021. Repeat every year  Bone Density status: Completed 01/29/2022. Results reflect: Bone density results: OSTEOPOROSIS. Repeat every 2 years.  Lung Cancer Screening: (Low Dose CT Chest recommended if Age  40-80 years, 30 pack-year currently smoking OR have quit w/in 15years.) does not qualify.   Lung Cancer Screening Referral: no  Additional Screening:  Hepatitis C Screening: does qualify; Completed 06/19/2021  Vision Screening: Recommended annual ophthalmology exams for early detection of glaucoma and other disorders of the eye. Is the patient up to date with their annual eye exam?  Yes  Who is the provider or what is the name of the office in which the patient attends annual eye exams? [facility], [place]. If pt is not established with a provider, would they like to be referred to a provider to establish care? No .   Dental Screening: Recommended annual dental exams for proper oral hygiene  Community Resource Referral / Chronic Care Management: CRR required this visit?  No   CCM required this visit?  No      Plan:     I have personally reviewed and noted the following in the patient's chart:   Medical and social history Use of alcohol, tobacco or illicit drugs  Current medications and supplements including opioid prescriptions. Patient is not currently taking opioid prescriptions. Functional ability and status Nutritional status Physical activity Advanced directives List of other physicians Hospitalizations, surgeries, and ER visits in previous 12 months Vitals Screenings to include cognitive, depression, and falls Referrals and  appointments  In addition, I have reviewed and discussed with patient certain preventive protocols, quality metrics, and best practice recommendations. A written personalized care plan for preventive services as well as general preventive health recommendations were provided to patient.     Sheral Flow, LPN   07/30/4494   Nurse Notes: N/A

## 2022-07-04 ENCOUNTER — Other Ambulatory Visit: Payer: Self-pay | Admitting: Internal Medicine

## 2022-07-04 DIAGNOSIS — Z1231 Encounter for screening mammogram for malignant neoplasm of breast: Secondary | ICD-10-CM

## 2022-07-16 ENCOUNTER — Other Ambulatory Visit: Payer: Self-pay | Admitting: Internal Medicine

## 2022-07-18 DIAGNOSIS — Z1211 Encounter for screening for malignant neoplasm of colon: Secondary | ICD-10-CM | POA: Diagnosis not present

## 2022-07-24 LAB — COLOGUARD: COLOGUARD: NEGATIVE

## 2022-08-20 ENCOUNTER — Ambulatory Visit
Admission: RE | Admit: 2022-08-20 | Discharge: 2022-08-20 | Disposition: A | Discharge status: Home / Self Care | Payer: Medicare Other | Source: Ambulatory Visit | Attending: Internal Medicine | Admitting: Internal Medicine

## 2022-08-20 DIAGNOSIS — Z1231 Encounter for screening mammogram for malignant neoplasm of breast: Secondary | ICD-10-CM

## 2022-08-31 ENCOUNTER — Other Ambulatory Visit: Payer: Self-pay | Admitting: Internal Medicine

## 2022-08-31 DIAGNOSIS — E7800 Pure hypercholesterolemia, unspecified: Secondary | ICD-10-CM

## 2022-09-13 ENCOUNTER — Telehealth: Payer: Self-pay | Admitting: Internal Medicine

## 2022-09-16 ENCOUNTER — Other Ambulatory Visit: Payer: Self-pay | Admitting: Internal Medicine

## 2022-09-17 ENCOUNTER — Other Ambulatory Visit: Payer: Self-pay

## 2022-09-17 ENCOUNTER — Ambulatory Visit (INDEPENDENT_AMBULATORY_CARE_PROVIDER_SITE_OTHER): Payer: Medicare Other | Admitting: Internal Medicine

## 2022-09-17 ENCOUNTER — Encounter: Payer: Self-pay | Admitting: Internal Medicine

## 2022-09-17 ENCOUNTER — Other Ambulatory Visit: Payer: Self-pay | Admitting: Internal Medicine

## 2022-09-17 ENCOUNTER — Telehealth: Payer: Self-pay | Admitting: Internal Medicine

## 2022-09-17 VITALS — BP 132/84 | HR 115 | Temp 98.2°F | Ht 66.0 in | Wt 109.0 lb

## 2022-09-17 DIAGNOSIS — R739 Hyperglycemia, unspecified: Secondary | ICD-10-CM | POA: Diagnosis not present

## 2022-09-17 DIAGNOSIS — I10 Essential (primary) hypertension: Secondary | ICD-10-CM | POA: Diagnosis not present

## 2022-09-17 DIAGNOSIS — E559 Vitamin D deficiency, unspecified: Secondary | ICD-10-CM

## 2022-09-17 DIAGNOSIS — E538 Deficiency of other specified B group vitamins: Secondary | ICD-10-CM

## 2022-09-17 DIAGNOSIS — J449 Chronic obstructive pulmonary disease, unspecified: Secondary | ICD-10-CM | POA: Diagnosis not present

## 2022-09-17 DIAGNOSIS — N1831 Chronic kidney disease, stage 3a: Secondary | ICD-10-CM | POA: Diagnosis not present

## 2022-09-17 DIAGNOSIS — E7800 Pure hypercholesterolemia, unspecified: Secondary | ICD-10-CM

## 2022-09-17 DIAGNOSIS — Z0001 Encounter for general adult medical examination with abnormal findings: Secondary | ICD-10-CM | POA: Diagnosis not present

## 2022-09-17 LAB — LIPID PANEL
Cholesterol: 169 mg/dL (ref 0–200)
HDL: 94.1 mg/dL (ref >39.00)
NonHDL: 75.18
Total CHOL/HDL Ratio: 2
Triglycerides: 372 mg/dL — ABNORMAL HIGH (ref 0.0–149.0)
VLDL: 74.4 mg/dL — ABNORMAL HIGH (ref 0.0–40.0)

## 2022-09-17 LAB — CBC WITH DIFFERENTIAL/PLATELET
Basophils Absolute: 0 10*3/uL (ref 0.0–0.1)
Basophils Relative: 0.4 % (ref 0.0–3.0)
Eosinophils Absolute: 0.1 10*3/uL (ref 0.0–0.7)
Eosinophils Relative: 3.3 % (ref 0.0–5.0)
HCT: 39 % (ref 36.0–46.0)
Hemoglobin: 13 g/dL (ref 12.0–15.0)
Lymphocytes Relative: 27.6 % (ref 12.0–46.0)
Lymphs Abs: 1.2 10*3/uL (ref 0.7–4.0)
MCHC: 33.3 g/dL (ref 30.0–36.0)
MCV: 109.4 fl — ABNORMAL HIGH (ref 78.0–100.0)
Monocytes Absolute: 0.4 10*3/uL (ref 0.1–1.0)
Monocytes Relative: 10.4 % (ref 3.0–12.0)
Neutro Abs: 2.4 10*3/uL (ref 1.4–7.7)
Neutrophils Relative %: 58.3 % (ref 43.0–77.0)
Platelets: 176 10*3/uL (ref 150.0–400.0)
RBC: 3.56 Mil/uL — ABNORMAL LOW (ref 3.87–5.11)
RDW: 15.9 % — ABNORMAL HIGH (ref 11.5–15.5)
WBC: 4.2 10*3/uL (ref 4.0–10.5)

## 2022-09-17 LAB — LDL CHOLESTEROL, DIRECT: Direct LDL: 40 mg/dL

## 2022-09-17 LAB — BASIC METABOLIC PANEL
BUN: 21 mg/dL (ref 6–23)
CO2: 27 mEq/L (ref 19–32)
Calcium: 9.1 mg/dL (ref 8.4–10.5)
Chloride: 106 mEq/L (ref 96–112)
Creatinine, Ser: 0.83 mg/dL (ref 0.40–1.20)
GFR: 71.09 mL/min (ref >60.00)
Glucose, Bld: 71 mg/dL (ref 70–99)
Potassium: 3.6 mEq/L (ref 3.5–5.1)
Sodium: 143 mEq/L (ref 135–145)

## 2022-09-17 LAB — HEPATIC FUNCTION PANEL
ALT: 31 U/L (ref 0–35)
AST: 76 U/L — ABNORMAL HIGH (ref 0–37)
Albumin: 3.7 g/dL (ref 3.5–5.2)
Alkaline Phosphatase: 116 U/L (ref 39–117)
Bilirubin, Direct: 0.2 mg/dL (ref 0.0–0.3)
Total Bilirubin: 0.8 mg/dL (ref 0.2–1.2)
Total Protein: 6.6 g/dL (ref 6.0–8.3)

## 2022-09-17 LAB — HEMOGLOBIN A1C: Hgb A1c MFr Bld: 5 % (ref 4.6–6.5)

## 2022-09-17 LAB — VITAMIN B12: Vitamin B-12: >1500 pg/mL — ABNORMAL HIGH (ref 211–911)

## 2022-09-17 LAB — TSH: TSH: 1.01 u[IU]/mL (ref 0.35–5.50)

## 2022-09-17 LAB — VITAMIN D 25 HYDROXY (VIT D DEFICIENCY, FRACTURES): VITD: 63.07 ng/mL (ref 30.00–100.00)

## 2022-09-17 MED ORDER — TRIAMTERENE-HCTZ 37.5-25 MG PO TABS: 1.0000 | ORAL_TABLET | Freq: Every day | ORAL | 3 refills | Status: DC

## 2022-09-17 MED ORDER — ALPRAZOLAM 0.25 MG PO TABS: ORAL_TABLET | ORAL | 2 refills | Status: DC

## 2022-09-17 NOTE — Progress Notes (Signed)
The test results show that your current treatment is OK, as the tests are stable.  Please continue the same plan.  There is no other need for change of treatment or further evaluation based on these results, at this time.  thanks 

## 2022-09-17 NOTE — Progress Notes (Signed)
Patient ID: Andrea [name], female   DOB: 12-Apr-1951, 72 y.o.   MRN: [id number]         Chief Complaint:: wellness exam and tachycardia, anxiety, copd, fatigue, htn, hld, hyperglycemia, low vit d and b12       HPI:  Andrea [name] is a 72 y.o. female here for wellness exam; for shingrix at pharmacy, o/w up to date                        Also Pt continues to have recurring LBP without bowel or bladder change, fever, wt loss,  worsening LE pain/numbness/weakness, gait change or falls, worse to walk, better to sit and wear back brace.  No falls.  Pt denies chest pain, increased sob or doe, wheezing, orthopnea, PND, increased LE swelling, palpitations, dizziness or syncope.   Pt denies polydipsia, polyuria, or new focal neuro s/s.    Pt denies fever, wt loss, night sweats, loss of appetite, or other constitutional symptoms  Does c/o ongoing fatigue, but denies signficant daytime hypersomnolence.     Wt Readings from Last 3 Encounters:  09/17/22 109 lb (49.4 kg)  05/27/22 110 lb (49.9 kg)  08/16/21 113 lb (51.3 kg)   BP Readings from Last 3 Encounters:  09/17/22 132/84  08/16/21 132/80  06/28/21 124/60   Immunization History  Administered Date(s) Administered   Fluad Quad(high Dose 65+) 12/16/2018, 12/25/2021   Hepatitis B, ADULT 04/05/2014, 12/07/2014   Influenza Whole 01/20/2009   Influenza, High Dose Seasonal PF 01/25/2021   Influenza,inj,Quad PF,6+ Mos 02/24/2014   Influenza-Unspecified 01/20/2017, 02/22/2020   PFIZER Comirnaty(Gray Top)Covid-19 Tri-Sucrose Vaccine 07/26/2020   PFIZER(Purple Top)SARS-COV-2 Vaccination 05/13/2019, 06/02/2019, 01/21/2020   PNEUMOCOCCAL CONJUGATE-20 12/25/2021   Pneumococcal Conjugate-13 04/09/2017   Pneumococcal Polysaccharide-23 08/09/2009, 06/25/2018   Tdap 05/15/2018  There are no preventive care reminders to display for this patient.    Past Medical History:  Diagnosis Date   Allergic rhinitis, cause unspecified 08/07/2012   COPD 03/23/2008    Cough 11/05/2007   HYPERTENSION 02/10/2007   MUSCLE CRAMPS 08/09/2009   OSTEOPOROSIS 02/10/2007   Other and unspecified hyperlipidemia 08/07/2012   PEPTIC ULCER DISEASE 03/23/2008   PNEUMONIA, RIGHT UPPER LOBE 03/23/2008   RESTLESS LEG SYNDROME 03/23/2008   Unspecified disorder of thyroid 11/16/2007   Past Surgical History:  Procedure Laterality Date   Breast Biopsy negative  1972   BREAST EXCISIONAL BIOPSY Right     reports that she quit smoking about 15 years ago. Her smoking use included cigarettes. She has a 39.00 pack-year smoking history. She has never used smokeless tobacco. She reports current alcohol use. She reports that she does not use drugs. family history includes Breast cancer (age of onset: 98) in her daughter; Heart disease in her mother; Leukemia in her father. Allergies  Allergen Reactions   Alendronate Sodium Nausea Only    GI upset   Zyrtec [Cetirizine] Itching, Rash and Other (See Comments)    Tongue burning, thrush   Sertraline Hcl Other (See Comments)    unknown   Current Outpatient Medications on File Prior to Visit  Medication Sig Dispense Refill   albuterol (VENTOLIN HFA) 108 (90 Base) MCG/ACT inhaler Inhale 2 puffs into the lungs every 6 (six) hours as needed for wheezing or shortness of breath. 36 g 3   allopurinol (ZYLOPRIM) 300 MG tablet TAKE 1 TABLET BY MOUTH EVERY DAY 90 tablet 3   aspirin EC 81 MG tablet Take 1 tablet (81 mg  total) by mouth daily. 30 tablet 5   Cholecalciferol (THERA-D 2000) 50 MCG (2000 UT) TABS 1 tab by mouth once daily 90 tablet 99   [facility] VITAMIN B12 1000 MCG tablet TAKE 1 TABLET BY MOUTH EVERY DAY 90 tablet 1   WIXELA INHUB 250-50 MCG/ACT AEPB INHALE 1 PUFF INTO THE LUNGS IN THE MORNING AND AT BEDTIME. 180 each 3   atorvastatin (LIPITOR) 20 MG tablet Take 1 tablet (20 mg total) by mouth daily. 90 tablet 3   potassium chloride (KLOR-CON) 10 MEQ tablet Take 1 tablet (10 mEq total) by mouth daily for 7 days. 7 tablet 0   No current  facility-administered medications on file prior to visit.        ROS:  All others reviewed and negative.  Objective        PE:  BP 132/84 (BP Location: Left Arm, Patient Position: Sitting, Cuff Size: Normal)   Pulse (!) 115   Temp 98.2 F (36.8 C) (Oral)   Ht 5\' 6"  (1.676 m)   Wt 109 lb (49.4 kg)   SpO2 99%   BMI 17.59 kg/m                 Constitutional: Pt appears in NAD               HENT: Head: NCAT.                Right Ear: External ear normal.                 Left Ear: External ear normal.                Eyes: . Pupils are equal, round, and reactive to light. Conjunctivae and EOM are normal               Nose: without d/c or deformity               Neck: Neck supple. Gross normal ROM               Cardiovascular: Normal rate and regular rhythm by my exam                Pulmonary/Chest: Effort normal and breath sounds decresaed without rales or wheezing.                Abd:  Soft, NT, ND, + BS, no organomegaly               Neurological: Pt is alert. At baseline orientation, motor grossly intact               Skin: Skin is warm. No rashes, no other new lesions, LE edema - none               Psychiatric: Pt behavior is normal without agitation   Micro: none  Cardiac tracings I have personally interpreted today:  none  Pertinent Radiological findings (summarize): none   Lab Results  Component Value Date   WBC 4.2 09/17/2022   HGB 13.0 09/17/2022   HCT 39.0 09/17/2022   PLT 176.0 09/17/2022   GLUCOSE 71 09/17/2022   CHOL 169 09/17/2022   TRIG 372.0 (H) 09/17/2022   HDL 94.10 09/17/2022   LDLDIRECT 40.0 09/17/2022   LDLCALC 114 (H) 04/10/2020   ALT 31 09/17/2022   AST 76 (H) 09/17/2022   NA 143 09/17/2022   K 3.6 09/17/2022   CL 106 09/17/2022   CREATININE 0.83 09/17/2022  BUN 21 09/17/2022   CO2 27 09/17/2022   TSH 1.01 09/17/2022   INR 1.00 02/11/2014   HGBA1C 5.0 09/17/2022   Assessment/Plan:  Andrea [name] is a 72 y.o. Black or African American  [2] female with  has a past medical history of Allergic rhinitis, cause unspecified (08/07/2012), COPD (03/23/2008), Cough (11/05/2007), HYPERTENSION (02/10/2007), MUSCLE CRAMPS (08/09/2009), OSTEOPOROSIS (02/10/2007), Other and unspecified hyperlipidemia (08/07/2012), PEPTIC ULCER DISEASE (03/23/2008), PNEUMONIA, RIGHT UPPER LOBE (03/23/2008), RESTLESS LEG SYNDROME (03/23/2008), and Unspecified disorder of thyroid (11/16/2007).  Vitamin D deficiency Last vitamin D Lab Results  Component Value Date   VD25OH 57.97 04/13/2021   Stable, cont oral replacement   B12 deficiency Lab Results  Component Value Date   VITAMINB12 >1550 (H) 04/13/2021   Stable, cont oral replacement - b12 1000 mcg qd   COPD (chronic obstructive pulmonary disease) Given fatigue with exertion and initial tachycardia today, I suspect either copd less than optimally treated or other such as anemia - pt delcines change of advair to trelegy for now as she just refilled the advair and was expensive for her; also for labs today as ordered  Encounter for well adult exam with abnormal findings Age and sex appropriate education and counseling updated with regular exercise and diet Referrals for preventative services - none needed Immunizations addressed - for shingrix at pharmacy Smoking counseling  - none needed Evidence for depression or other mood disorder - none significant Most recent labs reviewed. I have personally reviewed and have noted: 1) the patient's medical and social history 2) The patient's current medications and supplements 3) The patient's height, weight, and BMI have been recorded in the chart   Essential hypertension BP Readings from Last 3 Encounters:  09/17/22 132/84  08/16/21 132/80  06/28/21 124/60   Stable, pt to continue medical treatment maxide 25  - 1 qd   Hyperlipidemia Lab Results  Component Value Date   LDLCALC 114 (H) 04/10/2020   Uncontrolled, pt , pt to continue current statin  lipitor 20 mg and lower chol diet, as declines change for now   CKD (chronic kidney disease) Lab Results  Component Value Date   CREATININE 0.83 09/17/2022   Stable overall, cont to avoid nephrotoxins  Followup: Return in about 6 months (around 03/20/2023).  Oliver Barre, MD 09/17/2022 9:37 PM [facility]

## 2022-09-17 NOTE — Assessment & Plan Note (Signed)
Lab Results  °Component Value Date  ° VITAMINB12 >1550 (H) 04/13/2021  ° °Stable, cont oral replacement - b12 1000 mcg qd ° °

## 2022-09-17 NOTE — Patient Instructions (Addendum)
Please consider change of the wixhela you have now to the Trelegy, as this may help the jitteriness and the breathing and fatigue up the stairs  Please continue all other medications as before, and refills have been done if requested.  Please have the pharmacy call with any other refills you may need.  Please continue your efforts at being more active, low cholesterol diet, and weight control.  You are otherwise up to date with prevention measures today.  Please keep your appointments with your specialists as you may have planned  Please go to the LAB at the blood drawing area for the tests to be done  You will be contacted by phone if any changes need to be made immediately.  Otherwise, you will receive a letter about your results with an explanation, but please check with MyChart first.  Please remember to sign up for MyChart if you have not done so, as this will be important to you in the future with finding out test results, communicating by private email, and scheduling acute appointments online when needed.  Please make an Appointment to return in 6 months, or sooner if needed

## 2022-09-17 NOTE — Assessment & Plan Note (Signed)

## 2022-09-17 NOTE — Assessment & Plan Note (Signed)
BP Readings from Last 3 Encounters:  09/17/22 132/84  08/16/21 132/80  06/28/21 124/60   Stable, pt to continue medical treatment maxide 25  - 1 qd

## 2022-09-17 NOTE — Assessment & Plan Note (Signed)
Last vitamin D °Lab Results  °Component Value Date  ° VD25OH 57.97 04/13/2021  ° °Stable, cont oral replacement ° °

## 2022-09-17 NOTE — Assessment & Plan Note (Signed)
Given fatigue with exertion and initial tachycardia today, I suspect either copd less than optimally treated or other such as anemia - pt delcines change of advair to trelegy for now as she just refilled the advair and was expensive for her; also for labs today as ordered

## 2022-09-17 NOTE — Assessment & Plan Note (Signed)
Lab Results  Component Value Date   CREATININE 0.83 09/17/2022   Stable overall, cont to avoid nephrotoxins

## 2022-09-17 NOTE — Telephone Encounter (Signed)
Prescription Request  09/17/2022  LOV: 09/17/2022  What is the name of the medication or equipment? triamterene-hydrochlorothiazide (MAXZIDE-25) 37.5-25 MG tablet  Pt at pharmacy and the pharmacist is not seeing thos medication but have the 2 that was submitted. Pt states she have been out  of this medication for 2 week  Have you contacted your pharmacy to request a refill? No   Which pharmacy would you like this sent to [id number] [place], [place] - [address] [place] [address] Phone: [phone] Fax: [phone]   Patient notified that their request is being sent to the clinical staff for review and that they should receive a response within 2 business days.   Please advise at Mobile [phone] (mobile)

## 2022-09-17 NOTE — Assessment & Plan Note (Signed)
Lab Results  Component Value Date   LDLCALC 114 (H) 04/10/2020   Uncontrolled, pt , pt to continue current statin lipitor 20 mg and lower chol diet, as declines change for now

## 2022-10-12 ENCOUNTER — Other Ambulatory Visit: Payer: Self-pay | Admitting: Internal Medicine

## 2022-10-12 DIAGNOSIS — E7800 Pure hypercholesterolemia, unspecified: Secondary | ICD-10-CM

## 2022-11-18 ENCOUNTER — Telehealth (INDEPENDENT_AMBULATORY_CARE_PROVIDER_SITE_OTHER): Payer: Medicare Other | Admitting: Podiatry

## 2022-11-18 NOTE — Telephone Encounter (Signed)
This is a new patient scheduled to see Dr. Logan Bores on Aug 7th. She stubbed left great toe 4 weeks ago and has been soaking in peroxide and alcohol. Toe is turning dark under the nail. History of TIA and former smoker. Requesting appointment sooner than August 7th.

## 2022-11-27 ENCOUNTER — Ambulatory Visit: Payer: Medicare Other | Admitting: Podiatry

## 2022-11-28 ENCOUNTER — Ambulatory Visit: Payer: Medicare Other | Admitting: Podiatry

## 2022-11-28 DIAGNOSIS — S90212A Contusion of left great toe with damage to nail, initial encounter: Secondary | ICD-10-CM | POA: Diagnosis not present

## 2022-11-28 NOTE — Progress Notes (Signed)
  Subjective:  Patient ID: Andrea [name], female    DOB: January 03, 1951,  MRN: [id number]  Chief Complaint  Patient presents with   Foot Injury    NP- left toe possible injury about 4 weeks ago nail turning black, is not sure or remember when she may have injured,    72 y.o. female presents with the above complaint. History confirmed with patient.   Objective:  Physical Exam: warm, good capillary refill, no trophic changes or ulcerative lesions, normal DP and PT pulses, normal sensory exam, and bruise left hallux nail, slight onycholysis, no active bleeding or bruising of the toe itself no pain with range of motion of the joint or palpation of the bone.  Assessment:   1. Contusion of left great toe with damage to nail, initial encounter      Plan:  Patient was evaluated and treated and all questions answered.  We discussed that she has a contusion with complete bruising of the nail plate and the nail has been lifted off this is causing pain in shoe gear.  We discussed options and I recommended removal of the nail plate.  Following consent and agreement, the left hallux was anesthetized with lidocaine and Marcaine which she tolerated well.  The nail plate was then removed with a Therapist, nutritional, it was irrigated with peroxide and dressed with Silvadene and a bandage.  Post care instructions given.  Return as needed if she has further issues with the toenail or other problems.  Return if symptoms worsen or fail to improve.

## 2022-11-28 NOTE — Patient Instructions (Signed)

## 2023-01-13 ENCOUNTER — Ambulatory Visit: Payer: Medicare Other | Admitting: Internal Medicine

## 2023-01-20 ENCOUNTER — Ambulatory Visit (INDEPENDENT_AMBULATORY_CARE_PROVIDER_SITE_OTHER): Payer: Medicare Other | Admitting: Internal Medicine

## 2023-01-20 ENCOUNTER — Encounter: Payer: Self-pay | Admitting: Internal Medicine

## 2023-01-20 VITALS — BP 112/68 | HR 91 | Temp 98.6°F | Ht 66.0 in | Wt 106.0 lb

## 2023-01-20 DIAGNOSIS — J449 Chronic obstructive pulmonary disease, unspecified: Secondary | ICD-10-CM | POA: Diagnosis not present

## 2023-01-20 DIAGNOSIS — N1831 Chronic kidney disease, stage 3a: Secondary | ICD-10-CM

## 2023-01-20 DIAGNOSIS — N899 Noninflammatory disorder of vagina, unspecified: Secondary | ICD-10-CM

## 2023-01-20 DIAGNOSIS — M79672 Pain in left foot: Secondary | ICD-10-CM

## 2023-01-20 DIAGNOSIS — E7800 Pure hypercholesterolemia, unspecified: Secondary | ICD-10-CM

## 2023-01-20 DIAGNOSIS — E559 Vitamin D deficiency, unspecified: Secondary | ICD-10-CM

## 2023-01-20 DIAGNOSIS — M79671 Pain in right foot: Secondary | ICD-10-CM

## 2023-01-20 DIAGNOSIS — I10 Essential (primary) hypertension: Secondary | ICD-10-CM

## 2023-01-20 NOTE — Assessment & Plan Note (Signed)
Pt with lump or swelling perivaginal by hx , afeb, nontender - for GYN referral asap

## 2023-01-20 NOTE — Assessment & Plan Note (Signed)
Lab Results  Component Value Date   CREATININE 0.83 09/17/2022   Stable overall, cont to avoid nephrotoxins

## 2023-01-20 NOTE — Assessment & Plan Note (Signed)
Lab Results  Component Value Date   LDLCALC 114 (H) 04/10/2020   Uncontrolled, , pt to continue current statin lipitor 20 mg every day and lower chol diet, declines f/u lab today

## 2023-01-20 NOTE — Progress Notes (Unsigned)
   Rubin Payor, PhD, LAT, ATC acting as a scribe for Clementeen Graham, MD.  Andrea [name] is a 72 y.o. female who presents to [facility] at [facility] today for bilat foot pain x ***. Pt locates pain to ***  Aggravates: Treatments tried:  Pertinent review of systems: ***  Relevant historical information: ***   Exam:  There were no vitals taken for this visit. General: Well Developed, well nourished, and in no acute distress.   MSK: ***    Lab and Radiology Results No results found for this or any previous visit (from the past 72 hour(s)). No results found.     Assessment and Plan: 72 y.o. female with ***   PDMP not reviewed this encounter. No orders of the defined types were placed in this encounter.  No orders of the defined types were placed in this encounter.    Discussed warning signs or symptoms. Please see discharge instructions. Patient expresses understanding.   ***

## 2023-01-20 NOTE — Assessment & Plan Note (Signed)
Recent worsening back to work, for sport med referral

## 2023-01-20 NOTE — Assessment & Plan Note (Signed)
Overall stable, continue inhaler prn 

## 2023-01-20 NOTE — Patient Instructions (Addendum)
Please continue all other medications as before, and refills have been done if requested.  Please have the pharmacy call with any other refills you may need.  Please continue your efforts at being more active, low cholesterol diet, and weight control.  Please keep your appointments with your specialists as you may have planned  You will be contacted regarding the referral for: GYN and Sports Medicine   No further lab work needed today  Please make an Appointment to return in 6 months, or sooner if needed

## 2023-01-20 NOTE — Assessment & Plan Note (Signed)
Last vitamin D Lab Results  Component Value Date   VD25OH 63.07 09/17/2022   Stable, cont oral replacement

## 2023-01-20 NOTE — Assessment & Plan Note (Signed)
BP Readings from Last 3 Encounters:  01/20/23 112/68  09/17/22 132/84  08/16/21 132/80   Stable, pt to continue medical treatment maxide 25  - 1 qd

## 2023-01-20 NOTE — Progress Notes (Signed)
Patient ID: Andrea [name], female   DOB: Jan 26, 1951, 72 y.o.   MRN: [id number]        Chief Complaint: follow up HTN, HLD and  low vit d and b12, ckd3a, copd       HPI:  Andrea [name] is a 72 y.o. female here overall doing ok,  Pt denies chest pain, increased sob or doe, wheezing, orthopnea, PND, increased LE swelling, palpitations, dizziness or syncope.   Pt denies polydipsia, polyuria, or new focal neuro s/s.    Pt denies fever, wt loss, night sweats, loss of appetite, or other constitutional symptoms  Back to work for 4 months x 3 days per wk, now with back and feet pain but tolerable.  Plans to get flu and covid booster at pharmacy with husband.         Wt Readings from Last 3 Encounters:  01/20/23 106 lb (48.1 kg)  09/17/22 109 lb (49.4 kg)  05/27/22 110 lb (49.9 kg)   BP Readings from Last 3 Encounters:  01/20/23 112/68  09/17/22 132/84  08/16/21 132/80         Past Medical History:  Diagnosis Date   Allergic rhinitis, cause unspecified 08/07/2012   COPD 03/23/2008   Cough 11/05/2007   HYPERTENSION 02/10/2007   MUSCLE CRAMPS 08/09/2009   OSTEOPOROSIS 02/10/2007   Other and unspecified hyperlipidemia 08/07/2012   PEPTIC ULCER DISEASE 03/23/2008   PNEUMONIA, RIGHT UPPER LOBE 03/23/2008   RESTLESS LEG SYNDROME 03/23/2008   Unspecified disorder of thyroid 11/16/2007   Past Surgical History:  Procedure Laterality Date   Breast Biopsy negative  1972   BREAST EXCISIONAL BIOPSY Right     reports that she quit smoking about 15 years ago. Her smoking use included cigarettes. She started smoking about 54 years ago. She has a 39 pack-year smoking history. She has never used smokeless tobacco. She reports current alcohol use. She reports that she does not use drugs. family history includes Breast cancer (age of onset: 46) in her daughter; Heart disease in her mother; Leukemia in her father. Allergies  Allergen Reactions   Alendronate Sodium Nausea Only    GI upset   Zyrtec  [Cetirizine] Itching, Rash and Other (See Comments)    Tongue burning, thrush   Sertraline Hcl Other (See Comments)    unknown   Current Outpatient Medications on File Prior to Visit  Medication Sig Dispense Refill   albuterol (VENTOLIN HFA) 108 (90 Base) MCG/ACT inhaler Inhale 2 puffs into the lungs every 6 (six) hours as needed for wheezing or shortness of breath. 36 g 3   allopurinol (ZYLOPRIM) 300 MG tablet TAKE 1 TABLET BY MOUTH EVERY DAY 90 tablet 3   ALPRAZolam (XANAX) 0.25 MG tablet TAKE 1 TABLET(0.25 MG) BY MOUTH DAILY 90 tablet 2   aspirin EC 81 MG tablet Take 1 tablet (81 mg total) by mouth daily. 30 tablet 5   atorvastatin (LIPITOR) 20 MG tablet TAKE 1 TABLET BY MOUTH EVERY DAY 90 tablet 2   Cholecalciferol (THERA-D 2000) 50 MCG (2000 UT) TABS 1 tab by mouth once daily 90 tablet 99   [facility]S VITAMIN B12 1000 MCG tablet TAKE 1 TABLET BY MOUTH EVERY DAY 90 tablet 1   triamterene-hydrochlorothiazide (MAXZIDE-25) 37.5-25 MG tablet Take 1 tablet by mouth daily. 90 tablet 3   WIXELA INHUB 250-50 MCG/ACT AEPB INHALE 1 PUFF INTO THE LUNGS IN THE MORNING AND AT BEDTIME. 180 each 3   potassium chloride (KLOR-CON) 10 MEQ tablet Take 1 tablet (  10 mEq total) by mouth daily for 7 days. 7 tablet 0   No current facility-administered medications on file prior to visit.        ROS:  All others reviewed and negative.  Objective        PE:  BP 112/68 (BP Location: Right Arm, Patient Position: Sitting, Cuff Size: Normal)   Pulse 91   Temp 98.6 F (37 C) (Oral)   Ht 5\' 6"  (1.676 m)   Wt 106 lb (48.1 kg)   SpO2 99%   BMI 17.11 kg/m                 Constitutional: Pt appears in NAD               HENT: Head: NCAT.                Right Ear: External ear normal.                 Left Ear: External ear normal.                Eyes: . Pupils are equal, round, and reactive to light. Conjunctivae and EOM are normal               Nose: without d/c or deformity               Neck: Neck supple. Gross  normal ROM               Cardiovascular: Normal rate and regular rhythm.                 Pulmonary/Chest: Effort normal and breath sounds without rales or wheezing.                Abd:  Soft, NT, ND, + BS, no organomegaly               Neurological: Pt is alert. At baseline orientation, motor grossly intact               Skin: Skin is warm. No rashes, no other new lesions, LE edema - none               Psychiatric: Pt behavior is normal without agitation   Micro: none  Cardiac tracings I have personally interpreted today:  none  Pertinent Radiological findings (summarize): none   Lab Results  Component Value Date   WBC 4.2 09/17/2022   HGB 13.0 09/17/2022   HCT 39.0 09/17/2022   PLT 176.0 09/17/2022   GLUCOSE 71 09/17/2022   CHOL 169 09/17/2022   TRIG 372.0 (H) 09/17/2022   HDL 94.10 09/17/2022   LDLDIRECT 40.0 09/17/2022   LDLCALC 114 (H) 04/10/2020   ALT 31 09/17/2022   AST 76 (H) 09/17/2022   NA 143 09/17/2022   K 3.6 09/17/2022   CL 106 09/17/2022   CREATININE 0.83 09/17/2022   BUN 21 09/17/2022   CO2 27 09/17/2022   TSH 1.01 09/17/2022   INR 1.00 02/11/2014   HGBA1C 5.0 09/17/2022   Assessment/Plan:  Andrea [name] is a 72 y.o. Black or African American [2] female with  has a past medical history of Allergic rhinitis, cause unspecified (08/07/2012), COPD (03/23/2008), Cough (11/05/2007), HYPERTENSION (02/10/2007), MUSCLE CRAMPS (08/09/2009), OSTEOPOROSIS (02/10/2007), Other and unspecified hyperlipidemia (08/07/2012), PEPTIC ULCER DISEASE (03/23/2008), PNEUMONIA, RIGHT UPPER LOBE (03/23/2008), RESTLESS LEG SYNDROME (03/23/2008), and Unspecified disorder of thyroid (11/16/2007).  COPD (chronic obstructive pulmonary disease) Overall stable, continue inhaler prn  CKD (chronic kidney disease) Lab Results  Component Value Date   CREATININE 0.83 09/17/2022   Stable overall, cont to avoid nephrotoxins   Essential hypertension BP Readings from Last 3 Encounters:  01/20/23  112/68  09/17/22 132/84  08/16/21 132/80   Stable, pt to continue medical treatment maxide 25  - 1 qd   Hyperlipidemia Lab Results  Component Value Date   LDLCALC 114 (H) 04/10/2020   Uncontrolled, , pt to continue current statin lipitor 20 mg every day and lower chol diet, declines f/u lab today   Vitamin D deficiency Last vitamin D Lab Results  Component Value Date   VD25OH 63.07 09/17/2022   Stable, cont oral replacement   Bilateral foot pain Recent worsening back to work, for sport med referral  Swelling of vagina Pt with lump or swelling perivaginal by hx , afeb, nontender - for GYN referral asap  Followup: Return in about 6 months (around 07/20/2023).  Oliver Barre, MD 01/20/2023 12:14 PM [facility] [place] [facility]

## 2023-01-21 ENCOUNTER — Ambulatory Visit (INDEPENDENT_AMBULATORY_CARE_PROVIDER_SITE_OTHER): Payer: Medicare Other

## 2023-01-21 ENCOUNTER — Encounter: Payer: Self-pay | Admitting: Family Medicine

## 2023-01-21 ENCOUNTER — Ambulatory Visit: Payer: Medicare Other | Admitting: Family Medicine

## 2023-01-21 VITALS — BP 130/82 | HR 107 | Ht 66.0 in | Wt 106.0 lb

## 2023-01-21 DIAGNOSIS — M79672 Pain in left foot: Secondary | ICD-10-CM

## 2023-01-21 DIAGNOSIS — R202 Paresthesia of skin: Secondary | ICD-10-CM

## 2023-01-21 DIAGNOSIS — M19072 Primary osteoarthritis, left ankle and foot: Secondary | ICD-10-CM | POA: Diagnosis not present

## 2023-01-21 DIAGNOSIS — S92321D Displaced fracture of second metatarsal bone, right foot, subsequent encounter for fracture with routine healing: Secondary | ICD-10-CM | POA: Diagnosis not present

## 2023-01-21 DIAGNOSIS — M79671 Pain in right foot: Secondary | ICD-10-CM

## 2023-01-21 DIAGNOSIS — M19071 Primary osteoarthritis, right ankle and foot: Secondary | ICD-10-CM | POA: Diagnosis not present

## 2023-01-21 MED ORDER — GABAPENTIN 100 MG PO CAPS: 100.0000 mg | ORAL_CAPSULE | Freq: Every evening | ORAL | 3 refills | Status: DC | PRN

## 2023-01-21 NOTE — Patient Instructions (Addendum)
Thank you for coming in today.   Lets try insoles with arch supports or Scaphoid Pads usually small or medium  [employer] makes good scaphoid pads.  You can also get them from [employer] shoe store.   Lets try the insoles. If not better in about 1 month let me know and I wil order the nerve test.   Try the gabapentin at bedtime for nerve pain.   Let me know if this does not work.   Please get an Xray today before you leave

## 2023-01-30 ENCOUNTER — Telehealth: Payer: Self-pay | Admitting: Family Medicine

## 2023-01-30 MED ORDER — GABAPENTIN 100 MG PO CAPS: 100.0000 mg | ORAL_CAPSULE | Freq: Every evening | ORAL | 0 refills | Status: DC | PRN

## 2023-01-30 NOTE — Telephone Encounter (Signed)
Rx for Gabapentin sent to [facility]

## 2023-01-30 NOTE — Telephone Encounter (Signed)
[facility] [address] [facility] not [facility] in [facility], recommended she try another pharmacy. Not sure if this is a product or dosage issue.  Pt requesting rx be sent to [facility] on [address]

## 2023-02-10 NOTE — Progress Notes (Signed)
Left foot x-ray shows medium arthritis.

## 2023-02-10 NOTE — Progress Notes (Signed)
Right foot x-ray shows mild arthritis at the big toe and a history or healed fracture at the forefoot.

## 2023-03-18 ENCOUNTER — Other Ambulatory Visit: Payer: Self-pay | Admitting: Internal Medicine

## 2023-04-14 ENCOUNTER — Emergency Department (HOSPITAL_COMMUNITY)
Admission: EM | Admit: 2023-04-14 | Discharge: 2023-04-15 | Disposition: A | Discharge status: Home / Self Care | Payer: Medicare Other | Attending: Emergency Medicine | Admitting: Emergency Medicine

## 2023-04-14 ENCOUNTER — Other Ambulatory Visit: Payer: Self-pay

## 2023-04-14 ENCOUNTER — Ambulatory Visit (HOSPITAL_COMMUNITY)
Admission: EM | Admit: 2023-04-14 | Discharge: 2023-04-14 | Disposition: A | Discharge status: Home / Self Care | Payer: Medicare Other | Attending: Physician Assistant | Admitting: Physician Assistant

## 2023-04-14 ENCOUNTER — Encounter (HOSPITAL_COMMUNITY): Payer: Self-pay

## 2023-04-14 ENCOUNTER — Emergency Department (HOSPITAL_COMMUNITY): Payer: Medicare Other

## 2023-04-14 DIAGNOSIS — W19XXXA Unspecified fall, initial encounter: Secondary | ICD-10-CM | POA: Insufficient documentation

## 2023-04-14 DIAGNOSIS — R000 Tachycardia, unspecified: Secondary | ICD-10-CM

## 2023-04-14 DIAGNOSIS — S0012XA Contusion of left eyelid and periocular area, initial encounter: Secondary | ICD-10-CM | POA: Insufficient documentation

## 2023-04-14 DIAGNOSIS — S0993XA Unspecified injury of face, initial encounter: Secondary | ICD-10-CM

## 2023-04-14 DIAGNOSIS — H5712 Ocular pain, left eye: Secondary | ICD-10-CM

## 2023-04-14 DIAGNOSIS — S0990XA Unspecified injury of head, initial encounter: Secondary | ICD-10-CM | POA: Diagnosis not present

## 2023-04-14 DIAGNOSIS — Z7982 Long term (current) use of aspirin: Secondary | ICD-10-CM | POA: Diagnosis not present

## 2023-04-14 DIAGNOSIS — J322 Chronic ethmoidal sinusitis: Secondary | ICD-10-CM | POA: Diagnosis not present

## 2023-04-14 DIAGNOSIS — R519 Headache, unspecified: Secondary | ICD-10-CM | POA: Insufficient documentation

## 2023-04-14 DIAGNOSIS — R220 Localized swelling, mass and lump, head: Secondary | ICD-10-CM | POA: Diagnosis not present

## 2023-04-14 DIAGNOSIS — S0083XA Contusion of other part of head, initial encounter: Secondary | ICD-10-CM | POA: Diagnosis not present

## 2023-04-14 DIAGNOSIS — I6529 Occlusion and stenosis of unspecified carotid artery: Secondary | ICD-10-CM | POA: Diagnosis not present

## 2023-04-14 DIAGNOSIS — S0592XA Unspecified injury of left eye and orbit, initial encounter: Secondary | ICD-10-CM | POA: Diagnosis not present

## 2023-04-14 DIAGNOSIS — G9389 Other specified disorders of brain: Secondary | ICD-10-CM | POA: Diagnosis not present

## 2023-04-14 NOTE — Discharge Instructions (Signed)
As we discussed, I believe that you need a CT scan that we do not have in urgent care.  Please go directly to the [facility] emergency room.

## 2023-04-14 NOTE — ED Provider Triage Note (Signed)
Emergency Medicine Provider Triage Evaluation Note  Andrea [name] , a 72 y.o. female  was evaluated in triage.  Pt complains of pain around left eye after a fall.   Review of Systems  Positive: Swelling and pain left face Negative: loc  Physical Exam  BP (!) 153/104 (BP Location: Right Arm)   Pulse (!) 114   Temp 98.2 F (36.8 C)   Resp 18   Ht 5' (1.524 m)   Wt 48.5 kg   SpO2 99%   BMI 20.88 kg/m  Gen:   Awake, no distress   Resp:  Normal effort  MSK:   Moves extremities without difficulty  Other:  Bruising and swelling around left eye,   Medical Decision Making  Medically screening exam initiated at 8:55 PM.  Appropriate orders placed.  Andrea [name] was informed that the remainder of the evaluation will be completed by another provider, this initial triage assessment does not replace that evaluation, and the importance of remaining in the ED until their evaluation is complete.     Elson Areas, [place] 04/14/23 2056

## 2023-04-14 NOTE — ED Notes (Signed)
Patient is being discharged from the Urgent Care and sent to the Emergency Department via personal opperated vehicle . Per Dorann Ou PA, patient is in need of higher level of care due to Fall and head injury. Patient is aware and verbalizes understanding of plan of care.  Vitals:   04/14/23 1922 04/14/23 1925  BP: (!) 150/83   Pulse: (!) 122 (!) 120  Resp: 18   Temp: 98.8 F (37.1 C)   SpO2: 96%

## 2023-04-14 NOTE — ED Triage Notes (Signed)
"  I fell, went to go get some food out of the fridge and fell flat on my face hitting, bruising my let eye". DOI "12-22 around 1800". "I wen to turn with food in my hand and fell flat down on my face/head, no visual disturbance or changes with the eye currently". No other areas of body that hurt. "Some area on my forehead". No dizziness prior or after. No chest pain previous or current. No history of diabetes. No LOC.

## 2023-04-14 NOTE — ED Triage Notes (Addendum)
Pt arrived from urgent care s/p fall last night. Per pt, she was sent here from UC to have CT of head maxillofacial. Pt noted to have bruised, swollen left cheek and orbit. Pt states that she fell getting food out of fridge last night. Pt denies pain. Pt denies blood thinner

## 2023-04-14 NOTE — ED Notes (Signed)
To re access v/s once in room > 5-10 mins.

## 2023-04-14 NOTE — ED Provider Notes (Signed)
[facility]    CSN: [id number] Arrival date & time: 04/14/23  1758      History   Chief Complaint Chief Complaint  Patient presents with   Fall    HPI Andrea [name] is a 72 y.o. female.   Patient presented today accompanied by her husband.  Reports that yesterday evening she was walking to the refrigerator when she turned and somehow lost her balance and fell hitting her head on the refrigerator.  She did not have any loss of consciousness and denies any significant headache, dizziness, nausea, vomiting.  She does not take any blood thinning medications.  She denies any change to her vision including photophobia or pain with extraocular movements.  Denies any additional painful or injured areas.  Patient was noted to be tachycardic.  Review of previous vital signs indicate that she has been mildly tachycardic in the past but this is higher today.  She does have a history of COPD but has not been taking inhalers more frequently recently.  Denies any increased caffeine or other stimulant use.  She denies any chest pain, shortness of breath.  Does report some anxiety about being in her doctor's office.    Past Medical History:  Diagnosis Date   Allergic rhinitis, cause unspecified 08/07/2012   COPD 03/23/2008   Cough 11/05/2007   HYPERTENSION 02/10/2007   MUSCLE CRAMPS 08/09/2009   OSTEOPOROSIS 02/10/2007   Other and unspecified hyperlipidemia 08/07/2012   PEPTIC ULCER DISEASE 03/23/2008   PNEUMONIA, RIGHT UPPER LOBE 03/23/2008   RESTLESS LEG SYNDROME 03/23/2008   Unspecified disorder of thyroid 11/16/2007    Patient Active Problem List   Diagnosis Date Noted   Swelling of vagina 01/20/2023   Bilateral foot pain 01/20/2023   COPD with acute exacerbation (HCC) 06/20/2021   Acute respiratory failure with hypoxia (HCC) 06/19/2021   Transaminitis 06/19/2021   Alcohol use 06/19/2021   Acute gouty arthritis 10/12/2020   Vitamin D deficiency 04/10/2020   B12 deficiency  04/10/2020   Bloating symptom 04/10/2020   Constipation 04/10/2020   Hypokalemia 12/19/2018   Contact dermatitis 11/17/2018   CKD (chronic kidney disease) 11/17/2018   Anxiety and depression 02/26/2017   Low back pain 11/27/2016   AKI (acute kidney injury) (HCC) 02/23/2016   Osteoarthritis, hand 01/18/2015   Bilateral knee pain 01/18/2015   Rash and nonspecific skin eruption 12/23/2014   TIA (transient ischemic attack) 02/11/2014   Allergic rhinitis 08/07/2012   Thrush 08/07/2012   Hyperlipidemia 08/07/2012   Encounter for well adult exam with abnormal findings 12/09/2010   MUSCLE CRAMPS 08/09/2009   RESTLESS LEG SYNDROME 03/23/2008   PNEUMONIA, RIGHT UPPER LOBE 03/23/2008   COPD (chronic obstructive pulmonary disease) (HCC) 03/23/2008   Peptic ulcer 03/23/2008   Disorder of thyroid 11/16/2007   Essential hypertension 02/10/2007   OSTEOPOROSIS 02/10/2007    Past Surgical History:  Procedure Laterality Date   Breast Biopsy negative  1972   BREAST EXCISIONAL BIOPSY Right     OB History   No obstetric history on file.      Home Medications    Prior to Admission medications   Medication Sig Start Date End Date Taking? Authorizing Provider  allopurinol (ZYLOPRIM) 300 MG tablet TAKE 1 TABLET BY MOUTH EVERY DAY 09/17/22  Yes Corwin Levins, MD  aspirin EC 81 MG tablet Take 1 tablet (81 mg total) by mouth daily. 02/12/14  Yes Rai, Ripudeep K, MD  atorvastatin (LIPITOR) 20 MG tablet TAKE 1 TABLET BY MOUTH EVERY  DAY 10/14/22  Yes Corwin Levins, MD  gabapentin (NEURONTIN) 100 MG capsule Take 1-3 capsules (100-300 mg total) by mouth at bedtime as needed (nerve pain). 01/30/23  Yes Rodolph Bong, MD  Monte Fantasia INHUB 250-50 MCG/ACT AEPB INHALE 1 PUFF INTO THE LUNGS IN THE MORNING AND AT BEDTIME. 07/16/22  Yes Corwin Levins, MD  albuterol (VENTOLIN HFA) 108 (90 Base) MCG/ACT inhaler Inhale 2 puffs into the lungs every 6 (six) hours as needed for wheezing or shortness of breath. 04/10/20    Corwin Levins, MD  ALPRAZolam Prudy Feeler) 0.25 MG tablet TAKE 1 TABLET(0.25 MG) BY MOUTH DAILY 03/18/23   Corwin Levins, MD  Cholecalciferol (THERA-D 2000) 50 MCG (2000 UT) TABS 1 tab by mouth once daily 04/11/20   Corwin Levins, MD  [employer] VITAMIN B12 1000 MCG tablet TAKE 1 TABLET BY MOUTH EVERY DAY 01/06/20   Corwin Levins, MD  potassium chloride (KLOR-CON) 10 MEQ tablet Take 1 tablet (10 mEq total) by mouth daily for 7 days. 06/21/21 08/16/21  Dorcas Carrow, MD  triamterene-hydrochlorothiazide (MAXZIDE-25) 37.5-25 MG tablet Take 1 tablet by mouth daily. 09/17/22   Corwin Levins, MD    Family History Family History  Problem Relation Age of Onset   Heart disease Mother    Leukemia Father    Breast cancer Daughter 44    Social History Social History   Tobacco Use   Smoking status: Former    Current packs/day: 0.00    Average packs/day: 1 pack/day for 39.0 years (39.0 ttl pk-yrs)    Types: Cigarettes    Start date: 04/22/1968    Quit date: 04/23/2007    Years since quitting: 15.9   Smokeless tobacco: Never  Vaping Use   Vaping status: Never Used  Substance Use Topics   Alcohol use: Yes    Comment: Occassionally.   Drug use: No     Allergies   Alendronate sodium, Zyrtec [cetirizine], and Sertraline hcl   Review of Systems Review of Systems  Constitutional:  Positive for activity change. Negative for appetite change, fatigue and fever.  Eyes:  Negative for photophobia, pain, discharge, redness, itching and visual disturbance.  Respiratory:  Negative for cough and shortness of breath.   Cardiovascular:  Negative for chest pain.  Gastrointestinal:  Negative for nausea and vomiting.  Musculoskeletal:  Negative for arthralgias and myalgias.  Skin:  Positive for color change. Negative for wound.  Neurological:  Negative for dizziness, syncope, speech difficulty, weakness, light-headedness, numbness and headaches.     Physical Exam Triage Vital Signs ED Triage Vitals  Encounter  Vitals Group     BP 04/14/23 1922 (!) 150/83     Systolic BP Percentile --      Diastolic BP Percentile --      Pulse Rate 04/14/23 1922 (!) 122     Resp 04/14/23 1922 18     Temp 04/14/23 1922 98.8 F (37.1 C)     Temp Source 04/14/23 1922 Oral     SpO2 04/14/23 1922 96 %     Weight 04/14/23 1919 107 lb (48.5 kg)     Height 04/14/23 1919 5' (1.524 m)     Head Circumference --      Peak Flow --      Pain Score 04/14/23 1919 0     Pain Loc --      Pain Education --      Exclude from Growth Chart --    No data found.  Updated Vital Signs BP (!) 150/83 (BP Location: Left Arm)   Pulse (!) 120 Comment: Reported to provider.  Temp 98.8 F (37.1 C) (Oral)   Resp 18   Ht 5' (1.524 m)   Wt 107 lb (48.5 kg)   SpO2 96%   BMI 20.90 kg/m   Visual Acuity Right Eye Distance:  (UTO (Unable to obtain)) Left Eye Distance:  (UTO (Unable to obtain)) Bilateral Distance:  (UTO (Unable to obtain))  Right Eye Near:   Left Eye Near:    Bilateral Near:     Physical Exam Vitals reviewed.  Constitutional:      General: She is awake. She is not in acute distress.    Appearance: Normal appearance. She is well-developed. She is not ill-appearing.     Comments: Very pleasant female appears stated age in no acute distress sitting comfortably in exam room  HENT:     Head: Normocephalic and atraumatic. No raccoon eyes, Battle's sign or contusion.      Right Ear: Tympanic membrane, ear canal and external ear normal. No hemotympanum.     Left Ear: Tympanic membrane, ear canal and external ear normal. No hemotympanum.     Nose: Nose normal.     Mouth/Throat:     Tongue: Tongue does not deviate from midline.     Pharynx: Uvula midline. No oropharyngeal exudate or posterior oropharyngeal erythema.  Eyes:     Extraocular Movements: Extraocular movements intact.     Conjunctiva/sclera:     Left eye: No hemorrhage.    Pupils: Pupils are equal, round, and reactive to light.     Comments:  Significant tenderness palpation over superior and inferior lateral left orbit without deformity.  Cardiovascular:     Rate and Rhythm: Regular rhythm. Tachycardia present.     Heart sounds: Normal heart sounds, S1 normal and S2 normal. No murmur heard. Pulmonary:     Effort: Pulmonary effort is normal.     Breath sounds: Normal breath sounds. No wheezing, rhonchi or rales.     Comments: Clear to auscultation bilaterally Musculoskeletal:     Cervical back: Normal range of motion and neck supple. No spinous process tenderness or muscular tenderness.     Right lower leg: No edema.     Left lower leg: No edema.  Neurological:     General: No focal deficit present.     Mental Status: She is alert and oriented to person, place, and time.     Cranial Nerves: Cranial nerves 2-12 are intact.     Motor: Motor function is intact.     Coordination: Coordination is intact.     Gait: Gait is intact.     Comments: Cranial nerves II through XII grossly intact.  Psychiatric:        Behavior: Behavior is cooperative.      UC Treatments / Results  Labs (all labs ordered are listed, but only abnormal results are displayed) Labs Reviewed - No data to display  EKG   Radiology No results found.  Procedures Procedures (including critical care time)  Medications Ordered in UC Medications - No data to display  Initial Impression / Assessment and Plan / UC Course  I have reviewed the triage vital signs and the nursing notes.  Pertinent labs & imaging results that were available during my care of the patient were reviewed by me and considered in my medical decision making (see chart for details).     Based on [place] CT rules she would  qualify for head CT given her age.  We discussed that given her orbital tenderness and significant periorbital ecchymosis I recommend she go to the emergency room as she would likely need a facial bone CT as well which we do not have access to an urgent care.   Patient was agreeable will go directly to ER.  She was stable at time of discharge and safe for private transport.  Given her elevated heart rate EKG was obtained that showed sinus tachycardia without ischemic changes; compared to 06/20/2021 tracing no significant change but slightly later R wave progression.  We discussed that she would likely benefit from seeing her PCP and/or cardiologist given her persistently elevated heart rate but this can be arranged outpatient.  She expressed understanding and will follow-up with her PCP.  Final Clinical Impressions(s) / UC Diagnoses   Final diagnoses:  Contusion of face, initial encounter  Pain of left orbit  Fall, initial encounter  Tachycardia     Discharge Instructions      As we discussed, I believe that you need a CT scan that we do not have in urgent care.  Please go directly to the [facility] emergency room.     ED Prescriptions   None    PDMP not reviewed this encounter.   Jeani Hawking, PA-C 04/14/23 2006

## 2023-04-15 NOTE — Discharge Instructions (Signed)
You were seen in the emergency department for fall with facial injury.  As we discussed your CT imaging did not show any broken bones or brain bleeds.  I recommend using ice to help with swelling, and taking Tylenol as needed for pain.
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
## 2023-04-17 ENCOUNTER — Telehealth: Payer: Self-pay

## 2023-04-17 NOTE — Transitions of Care (Post Inpatient/ED Visit) (Unsigned)
   04/17/2023  Name: Andrea [name] MRN: [id number] DOB: [birth year]  Today's TOC FU Call Status: Today's TOC FU Call Status:: Unsuccessful Call (1st Attempt) Unsuccessful Call (1st Attempt) Date: 04/17/23  Attempted to reach the patient regarding the most recent Inpatient/ED visit.  Follow Up Plan: Additional outreach attempts will be made to reach the patient to complete the Transitions of Care (Post Inpatient/ED visit) call.   Abby Hilmar Moldovan, CMA  [facility] Team Direct Dial: [phone]

## 2023-04-22 NOTE — Transitions of Care (Post Inpatient/ED Visit) (Signed)
   04/22/2023  Name: Andrea [name] MRN: [id number] DOB: [birth year]  Today's TOC FU Call Status: Today's TOC FU Call Status:: Unsuccessful Call (2nd Attempt) Unsuccessful Call (1st Attempt) Date: 04/17/23 Unsuccessful Call (2nd Attempt) Date: 04/22/23  Attempted to reach the patient regarding the most recent Inpatient/ED visit.  Follow Up Plan: Additional outreach attempts will be made to reach the patient to complete the Transitions of Care (Post Inpatient/ED visit) call.   Kimblery Diop, CMA  [facility] Team Direct Dial: [phone]

## 2023-04-24 NOTE — Transitions of Care (Post Inpatient/ED Visit) (Signed)
 04/24/2023  Name: Andrea [name] MRN: [id number] DOB: Jan 21, 1951  Today's TOC FU Call Status: Today's TOC FU Call Status:: Successful TOC FU Call Completed Unsuccessful Call (1st Attempt) Date: 04/17/23 Unsuccessful Call (2nd Attempt) Date: 04/22/23 [facility] FU Call Complete Date: 04/24/23 Patient's Name and Date of Birth confirmed.  Transition Care Management Follow-up Telephone Call Date of Discharge: 04/15/23 Discharge Facility: [facility]) Type of Discharge: Emergency Department Reason for ED Visit: Other: (injury to face) How have you been since you were released from the hospital?: Better (still bruised but not hurting) Any questions or concerns?: No  Items Reviewed: Did you receive and understand the discharge instructions provided?: Yes Medications obtained,verified, and reconciled?: Yes (Medications Reviewed) Any new allergies since your discharge?: No Dietary orders reviewed?: NA Do you have support at home?: Yes People in Home: spouse  Medications Reviewed Today: Medications Reviewed Today     Reviewed by Ly Wass, [place] LABOR, CMA (Certified Medical Assistant) on 04/24/23 at 1456  Med List Status: <None>   Medication Order Taking? Sig Documenting Provider Last Dose Status Informant  albuterol  (VENTOLIN  HFA) 108 (90 Base) MCG/ACT inhaler [id number] No Inhale 2 puffs into the lungs every 6 (six) hours as needed for wheezing or shortness of breath. Norleen Lynwood ORN, MD Unknown Active Self  allopurinol  (ZYLOPRIM ) 300 MG tablet [id number] No TAKE 1 TABLET BY MOUTH EVERY DAY Norleen Lynwood ORN, MD 04/14/2023 10:00 AM Active   ALPRAZolam  (XANAX ) 0.25 MG tablet [id number] No TAKE 1 TABLET(0.25 MG) BY MOUTH DAILY Norleen Lynwood ORN, MD Unknown Active   aspirin  EC 81 MG tablet [id number] No Take 1 tablet (81 mg total) by mouth daily. Davia Nydia POUR, MD 04/14/2023 10:00 AM Active Self  atorvastatin  (LIPITOR) 20 MG tablet [id number] No TAKE 1 TABLET BY MOUTH EVERY DAY Norleen Lynwood ORN, MD 04/13/2023  Evening Active   Cholecalciferol (THERA-D 2000) 50 MCG (2000 UT) TABS [id number] No 1 tab by mouth once daily Norleen Lynwood ORN, MD Unknown Active Self  [facility] VITAMIN B12 1000 MCG tablet [id number] No TAKE 1 TABLET BY MOUTH EVERY DAY Norleen Lynwood ORN, MD Unknown Active Self  gabapentin  (NEURONTIN ) 100 MG capsule [id number] No Take 1-3 capsules (100-300 mg total) by mouth at bedtime as needed (nerve pain). Joane Artist RAMAN, MD 04/13/2023 Bedtime Active   potassium chloride  (KLOR-CON ) 10 MEQ tablet [id number] No Take 1 tablet (10 mEq total) by mouth daily for 7 days. Raenelle Coria, MD Taking Expired 08/16/21 2359   triamterene -hydrochlorothiazide (MAXZIDE-25) 37.5-25 MG tablet [id number] No Take 1 tablet by mouth daily. Norleen Lynwood ORN, MD Unknown Active   WIXELA INHUB 250-50 MCG/ACT AEPB [id number] No INHALE 1 PUFF INTO THE LUNGS IN THE MORNING AND AT BEDTIME. Norleen Lynwood ORN, MD 04/14/2023 10:00 AM Active             Home Care and Equipment/Supplies: Were Home Health Services Ordered?: NA Any new equipment or medical supplies ordered?: NA  Functional Questionnaire: Do you need assistance with bathing/showering or dressing?: No Do you need assistance with meal preparation?: No Do you need assistance with eating?: No Do you have difficulty maintaining continence: No Do you need assistance with getting out of bed/getting out of a chair/moving?: No Do you have difficulty managing or taking your medications?: No  Follow up appointments reviewed: PCP Follow-up appointment confirmed?: Yes Date of PCP follow-up appointment?: 04/28/23 Follow-up Provider: DOROTHA [facility] Follow-up appointment confirmed?: NA Do you need transportation to your follow-up appointment?: No Do you understand  care options if your condition(s) worsen?: Yes-patient verbalized understanding    Toleen Lachapelle, CMA  [facility] AWV Team Direct Dial: [phone]\

## 2023-04-28 ENCOUNTER — Ambulatory Visit (INDEPENDENT_AMBULATORY_CARE_PROVIDER_SITE_OTHER): Payer: Medicare Other | Admitting: Internal Medicine

## 2023-04-28 ENCOUNTER — Encounter: Payer: Self-pay | Admitting: Internal Medicine

## 2023-04-28 ENCOUNTER — Ambulatory Visit (INDEPENDENT_AMBULATORY_CARE_PROVIDER_SITE_OTHER): Payer: Medicare Other

## 2023-04-28 VITALS — BP 136/84 | HR 105 | Temp 98.2°F | Ht 60.0 in | Wt 108.0 lb

## 2023-04-28 DIAGNOSIS — E538 Deficiency of other specified B group vitamins: Secondary | ICD-10-CM

## 2023-04-28 DIAGNOSIS — I10 Essential (primary) hypertension: Secondary | ICD-10-CM

## 2023-04-28 DIAGNOSIS — R739 Hyperglycemia, unspecified: Secondary | ICD-10-CM | POA: Diagnosis not present

## 2023-04-28 DIAGNOSIS — E559 Vitamin D deficiency, unspecified: Secondary | ICD-10-CM

## 2023-04-28 DIAGNOSIS — E7800 Pure hypercholesterolemia, unspecified: Secondary | ICD-10-CM

## 2023-04-28 DIAGNOSIS — Z0001 Encounter for general adult medical examination with abnormal findings: Secondary | ICD-10-CM | POA: Diagnosis not present

## 2023-04-28 DIAGNOSIS — R55 Syncope and collapse: Secondary | ICD-10-CM

## 2023-04-28 LAB — BASIC METABOLIC PANEL
BUN: 26 mg/dL — ABNORMAL HIGH (ref 6–23)
CO2: 27 meq/L (ref 19–32)
Calcium: 9.9 mg/dL (ref 8.4–10.5)
Chloride: 106 meq/L (ref 96–112)
Creatinine, Ser: 0.94 mg/dL (ref 0.40–1.20)
GFR: 60.97 mL/min (ref >60.00)
Glucose, Bld: 87 mg/dL (ref 70–99)
Potassium: 4.2 meq/L (ref 3.5–5.1)
Sodium: 143 meq/L (ref 135–145)

## 2023-04-28 LAB — CBC WITH DIFFERENTIAL/PLATELET
Basophils Absolute: 0 10*3/uL (ref 0.0–0.1)
Basophils Relative: 0.6 % (ref 0.0–3.0)
Eosinophils Absolute: 0.1 10*3/uL (ref 0.0–0.7)
Eosinophils Relative: 3 % (ref 0.0–5.0)
HCT: 39.6 % (ref 36.0–46.0)
Hemoglobin: 13.2 g/dL (ref 12.0–15.0)
Lymphocytes Relative: 26.9 % (ref 12.0–46.0)
Lymphs Abs: 1.1 10*3/uL (ref 0.7–4.0)
MCHC: 33.4 g/dL (ref 30.0–36.0)
MCV: 108 fL — ABNORMAL HIGH (ref 78.0–100.0)
Monocytes Absolute: 0.4 10*3/uL (ref 0.1–1.0)
Monocytes Relative: 10.1 % (ref 3.0–12.0)
Neutro Abs: 2.5 10*3/uL (ref 1.4–7.7)
Neutrophils Relative %: 59.4 % (ref 43.0–77.0)
Platelets: 233 10*3/uL (ref 150.0–400.0)
RBC: 3.67 Mil/uL — ABNORMAL LOW (ref 3.87–5.11)
RDW: 15.2 % (ref 11.5–15.5)
WBC: 4.2 10*3/uL (ref 4.0–10.5)

## 2023-04-28 LAB — HEPATIC FUNCTION PANEL
ALT: 19 U/L (ref 0–35)
AST: 48 U/L — ABNORMAL HIGH (ref 0–37)
Albumin: 4.2 g/dL (ref 3.5–5.2)
Alkaline Phosphatase: 99 U/L (ref 39–117)
Bilirubin, Direct: 0.2 mg/dL (ref 0.0–0.3)
Total Bilirubin: 0.6 mg/dL (ref 0.2–1.2)
Total Protein: 7.4 g/dL (ref 6.0–8.3)

## 2023-04-28 LAB — URINALYSIS, ROUTINE W REFLEX MICROSCOPIC
Bilirubin Urine: NEGATIVE
Hgb urine dipstick: NEGATIVE
Ketones, ur: NEGATIVE
Nitrite: NEGATIVE
RBC / HPF: NONE SEEN (ref 0–?)
Specific Gravity, Urine: 1.015 (ref 1.000–1.030)
Total Protein, Urine: NEGATIVE
Urine Glucose: NEGATIVE
Urobilinogen, UA: 1 (ref 0.0–1.0)
pH: 6.5 (ref 5.0–8.0)

## 2023-04-28 LAB — LIPID PANEL
Cholesterol: 214 mg/dL — ABNORMAL HIGH (ref 0–200)
HDL: 82 mg/dL (ref >39.00)
Total CHOL/HDL Ratio: 3
Triglycerides: 495 mg/dL — ABNORMAL HIGH (ref 0.0–149.0)

## 2023-04-28 LAB — VITAMIN D 25 HYDROXY (VIT D DEFICIENCY, FRACTURES): VITD: 51.51 ng/mL (ref 30.00–100.00)

## 2023-04-28 LAB — VITAMIN B12: Vitamin B-12: >1537 pg/mL — ABNORMAL HIGH (ref 211–911)

## 2023-04-28 LAB — TSH: TSH: 1.07 u[IU]/mL (ref 0.35–5.50)

## 2023-04-28 LAB — HEMOGLOBIN A1C: Hgb A1c MFr Bld: 5.3 % (ref 4.6–6.5)

## 2023-04-28 NOTE — Assessment & Plan Note (Addendum)
 Etiology unclear, for lab today including cbc, cxr,, EEG and cardiac event monitor

## 2023-04-28 NOTE — Assessment & Plan Note (Signed)
 BP Readings from Last 3 Encounters:  04/28/23 136/84  04/14/23 (!) 150/104  04/14/23 (!) 150/83   Stable, pt to continue medical treatment maxide 25 qd

## 2023-04-28 NOTE — Progress Notes (Signed)
 Patient ID: Andrea [name], female   DOB: 1950-05-27, 73 y.o.   MRN: [id number]         Chief Complaint:: wellness exam and Medical Management of Chronic Issues [facility] follow up)  With syncope and fall, copd, htn, hld, low vit d and b12       HPI:  Andrea [name] is a 73 y.o. female here for wellness exam; declines shingrx, o/w up to date                        Also here with syncope episode without prodrome symptomse dc 24 2024, results in fall in kitchen to left face with large bruising, seen [facility]ailly at [facility] and then had CT urgently at ED with neg imaging for fx.  ECG neg by report,  No other lab testing done.  Pt out for a few minutes at most, not witnessed by husband but found when she is waking up, apparently no confusion or post ictal per husband with her today.  No prodromal symptom.  Pt denies chest pain, increased sob or doe, wheezing, orthopnea, PND, increased LE swelling, palpitations, dizziness or syncope.   Pt denies polydipsia, polyuria, or new focal neuro s/s.      Wt Readings from Last 3 Encounters:  04/28/23 108 lb (49 kg)  04/14/23 106 lb 14.8 oz (48.5 kg)  04/14/23 107 lb (48.5 kg)   BP Readings from Last 3 Encounters:  04/28/23 136/84  04/14/23 (!) 150/104  04/14/23 (!) 150/83   Immunization History  Administered Date(s) Administered   Fluad Quad(high Dose 65+) 12/16/2018, 12/25/2021   Fluad Trivalent(High Dose 65+) 01/21/2023   Hepatitis B, ADULT 04/05/2014, 12/07/2014   Influenza Whole 01/20/2009   Influenza, High Dose Seasonal PF 01/25/2021   Influenza,inj,Quad PF,6+ Mos 02/24/2014   Influenza-Unspecified 01/20/2017, 02/22/2020   Moderna Covid-19 Fall Seasonal Vaccine 44yrs & older 01/21/2023   PFIZER Comirnaty(Gray Top)Covid-19 Tri-Sucrose Vaccine 07/26/2020   PFIZER(Purple Top)SARS-COV-2 Vaccination 05/13/2019, 06/02/2019, 01/21/2020   PNEUMOCOCCAL CONJUGATE-20 12/25/2021   Pneumococcal Conjugate-13 04/09/2017   Pneumococcal Polysaccharide-23  08/09/2009, 06/25/2018   Tdap 05/15/2018   Health Maintenance Due  Topic Date Due   Medicare Annual Wellness (AWV)  05/28/2023      Past Medical History:  Diagnosis Date   Allergic rhinitis, cause unspecified 08/07/2012   COPD 03/23/2008   Cough 11/05/2007   HYPERTENSION 02/10/2007   MUSCLE CRAMPS 08/09/2009   OSTEOPOROSIS 02/10/2007   Other and unspecified hyperlipidemia 08/07/2012   PEPTIC ULCER DISEASE 03/23/2008   PNEUMONIA, RIGHT UPPER LOBE 03/23/2008   RESTLESS LEG SYNDROME 03/23/2008   Unspecified disorder of thyroid  11/16/2007   Past Surgical History:  Procedure Laterality Date   Breast Biopsy negative  1972   BREAST EXCISIONAL BIOPSY Right     reports that she quit smoking about 16 years ago. Her smoking use included cigarettes. She started smoking about 55 years ago. She has a 39 pack-year smoking history. She has never used smokeless tobacco. She reports current alcohol use. She reports that she does not use drugs. family history includes Breast cancer (age of onset: 42) in her daughter; Heart disease in her mother; Leukemia in her father. Allergies  Allergen Reactions   Alendronate  Sodium Nausea Only    GI upset   Zyrtec  [Cetirizine ] Itching, Rash and Other (See Comments)    Tongue burning, thrush   Sertraline Hcl Other (See Comments)    unknown   Current Outpatient Medications on File Prior to Visit  Medication Sig Dispense Refill   albuterol  (VENTOLIN  HFA) 108 (90 Base) MCG/ACT inhaler Inhale 2 puffs into the lungs every 6 (six) hours as needed for wheezing or shortness of breath. 36 g 3   allopurinol  (ZYLOPRIM ) 300 MG tablet TAKE 1 TABLET BY MOUTH EVERY DAY 90 tablet 3   ALPRAZolam  (XANAX ) 0.25 MG tablet TAKE 1 TABLET(0.25 MG) BY MOUTH DAILY 90 tablet 2   aspirin  EC 81 MG tablet Take 1 tablet (81 mg total) by mouth daily. 30 tablet 5   atorvastatin  (LIPITOR) 20 MG tablet TAKE 1 TABLET BY MOUTH EVERY DAY 90 tablet 2   Cholecalciferol (THERA-D 2000) 50 MCG (2000 UT)  TABS 1 tab by mouth once daily 90 tablet 99   [facility] VITAMIN B12 1000 MCG tablet TAKE 1 TABLET BY MOUTH EVERY DAY 90 tablet 1   gabapentin  (NEURONTIN ) 100 MG capsule Take 1-3 capsules (100-300 mg total) by mouth at bedtime as needed (nerve pain). 90 capsule 0   triamterene -hydrochlorothiazide (MAXZIDE-25) 37.5-25 MG tablet Take 1 tablet by mouth daily. 90 tablet 3   WIXELA INHUB 250-50 MCG/ACT AEPB INHALE 1 PUFF INTO THE LUNGS IN THE MORNING AND AT BEDTIME. 180 each 3   potassium chloride  (KLOR-CON ) 10 MEQ tablet Take 1 tablet (10 mEq total) by mouth daily for 7 days. 7 tablet 0   No current facility-administered medications on file prior to visit.        ROS:  All others reviewed and negative.  Objective        PE:  BP 136/84 (BP Location: Left Arm, Patient Position: Sitting, Cuff Size: Normal)   Pulse (!) 105   Temp 98.2 F (36.8 C) (Oral)   Ht 5' (1.524 m)   Wt 108 lb (49 kg)   SpO2 97%   BMI 21.09 kg/m                 Constitutional: Pt appears in NAD               HENT: Head: NCAT.                Right Ear: External ear normal.                 Left Ear: External ear normal.                Eyes: . Pupils are equal, round, and reactive to light. Conjunctivae and EOM are normal               Nose: without d/c or deformity               Neck: Neck supple. Gross normal ROM               Cardiovascular: Normal rate and regular rhythm.                 Pulmonary/Chest: Effort normal and breath sounds without rales or wheezing.                Abd:  Soft, NT, ND, + BS, no organomegaly               Neurological: Pt is alert. At baseline orientation, motor grossly intact               Skin: Skin is warm.  LE edema - none; eft facial brusing improved                Psychiatric: Pt behavior is normal  without agitation   Micro: none  Cardiac tracings I have personally interpreted today:  none  Pertinent Radiological findings (summarize): none   Lab Results  Component Value Date   WBC  4.2 09/17/2022   HGB 13.0 09/17/2022   HCT 39.0 09/17/2022   PLT 176.0 09/17/2022   GLUCOSE 71 09/17/2022   CHOL 169 09/17/2022   TRIG 372.0 (H) 09/17/2022   HDL 94.10 09/17/2022   LDLDIRECT 40.0 09/17/2022   LDLCALC 114 (H) 04/10/2020   ALT 31 09/17/2022   AST 76 (H) 09/17/2022   NA 143 09/17/2022   K 3.6 09/17/2022   CL 106 09/17/2022   CREATININE 0.83 09/17/2022   BUN 21 09/17/2022   CO2 27 09/17/2022   TSH 1.01 09/17/2022   INR 1.00 02/11/2014   HGBA1C 5.0 09/17/2022   Assessment/Plan:  Andrea [name] is a 73 y.o. Black or African American [2] female with  has a past medical history of Allergic rhinitis, cause unspecified (08/07/2012), COPD (03/23/2008), Cough (11/05/2007), HYPERTENSION (02/10/2007), MUSCLE CRAMPS (08/09/2009), OSTEOPOROSIS (02/10/2007), Other and unspecified hyperlipidemia (08/07/2012), PEPTIC ULCER DISEASE (03/23/2008), PNEUMONIA, RIGHT UPPER LOBE (03/23/2008), RESTLESS LEG SYNDROME (03/23/2008), and Unspecified disorder of thyroid  (11/16/2007).  Encounter for well adult exam with abnormal findings Age and sex appropriate education and counseling updated with regular exercise and diet Referrals for preventative services - none needed Immunizations addressed - for shingrix at pharmacy Smoking counseling  - none needed Evidence for depression or other mood disorder - none significant Most recent labs reviewed. I have personally reviewed and have noted: 1) the patient's medical and social history 2) The patient's current medications and supplements 3) The patient's height, weight, and BMI have been recorded in the chart   Essential hypertension BP Readings from Last 3 Encounters:  04/28/23 136/84  04/14/23 (!) 150/104  04/14/23 (!) 150/83   Stable, pt to continue medical treatment maxide 25 qd   Hyperlipidemia Lab Results  Component Value Date   LDLCALC 114 (H) 04/10/2020   Uncontrolled, for lower chol diet, and f/u lab, continue lipitor 20  qd   Vitamin D  deficiency Last vitamin D  Lab Results  Component Value Date   VD25OH 63.07 09/17/2022   Stable, cont oral replacement   B12 deficiency Lab Results  Component Value Date   VITAMINB12 >1500 (H) 09/17/2022   Stable, cont oral replacement - b12 1000 mcg qd   Syncope and collapse Etiology unclear, for lab today including cbc, cxr,, EEG and cardiac event monitor  Followup: Return in about 6 months (around 10/26/2023).  Lynwood Rush, MD 04/28/2023 8:39 PM [facility] [facility]

## 2023-04-28 NOTE — Patient Instructions (Signed)
 Please continue all other medications as before, and refills have been done if requested.  Please have the pharmacy call with any other refills you may need.  Please continue your efforts at being more active, low cholesterol diet, and weight control.  You are otherwise up to date with prevention measures today.  Please keep your appointments with your specialists as you may have planned  You will be contacted regarding the referral for: EEG (to look for seizure), and Cardiac Event monitor (for heart rhythm problems)  Please go to the XRAY Department in the first floor for the x-ray testing  Please go to the LAB at the blood drawing area for the tests to be done  You will be contacted by phone if any changes need to be made immediately.  Otherwise, you will receive a letter about your results with an explanation, but please check with MyChart first.  Please make an Appointment to return in 6 months, or sooner if needed

## 2023-04-28 NOTE — Assessment & Plan Note (Signed)
 Lab Results  Component Value Date   LDLCALC 114 (H) 04/10/2020   Uncontrolled, for lower chol diet, and f/u lab, continue lipitor 20 qd

## 2023-04-28 NOTE — Assessment & Plan Note (Signed)
 Lab Results  Component Value Date   VITAMINB12 >1500 (H) 09/17/2022   Stable, cont oral replacement - b12 1000 mcg qd

## 2023-04-28 NOTE — Assessment & Plan Note (Signed)

## 2023-04-28 NOTE — Assessment & Plan Note (Signed)
Last vitamin D Lab Results  Component Value Date   VD25OH 63.07 09/17/2022   Stable, cont oral replacement

## 2023-04-29 ENCOUNTER — Other Ambulatory Visit: Payer: Self-pay | Admitting: Internal Medicine

## 2023-04-29 LAB — LDL CHOLESTEROL, DIRECT: Direct LDL: 66 mg/dL

## 2023-04-29 MED ORDER — FENOFIBRATE 160 MG PO TABS: 160.0000 mg | ORAL_TABLET | Freq: Every day | ORAL | 3 refills | Status: DC

## 2023-04-29 NOTE — Progress Notes (Signed)
 The test results show that your current treatment is OK, as the tests are stable, except the triglycerides are still quite elevated.  We should add a medication called fenofibrate  160 mg per day to avoid problems in the future like pancreatitis and heart disease.  I will order this, and you should hear from the office as well.  Otherwise,  Please continue the same plan.  Staff to please inform pt, I will do rx x 1

## 2023-05-07 DIAGNOSIS — R55 Syncope and collapse: Secondary | ICD-10-CM

## 2023-05-29 ENCOUNTER — Ambulatory Visit: Payer: Medicare Other

## 2023-05-29 VITALS — Ht 60.0 in | Wt 108.0 lb

## 2023-05-29 DIAGNOSIS — Z780 Asymptomatic menopausal state: Secondary | ICD-10-CM | POA: Diagnosis not present

## 2023-05-29 DIAGNOSIS — Z0000 Encounter for general adult medical examination without abnormal findings: Secondary | ICD-10-CM

## 2023-05-29 NOTE — Patient Instructions (Signed)
 Andrea [name] , Thank you for taking time to come for your Medicare Wellness Visit. I appreciate your ongoing commitment to your health goals. Please review the following plan we discussed and let me know if I can assist you in the future.   Referrals/Orders/Follow-Ups/Clinician Recommendations: It was nice to talk with you today.  Each day, aim for 6 glasses of water, plenty of protein in your diet and try to get up and walk/ stretch every hour for 5-10 minutes at a time.  You have an order for:  (This is due in October 2025)  [x]   Bone Density     Please call for appointment:  [facility] [address] [place], [place] [address] [phone]  Make sure to wear two-piece clothing.  No lotions, powders, or deodorants the day of the appointment. Make sure to bring picture ID and insurance card.  Bring list of medications you are currently taking including any supplements.   Schedule your [facility] screening mammogram through MyChart!   Log into your MyChart account.  Go to 'Visit' (or 'Appointments' if on mobile App) --> Schedule an Appointment  Under 'Select a Reason for Visit' choose the Mammogram Screening option.  Complete the pre-visit questions and select the time and place that best fits your schedule.    This is a list of the screening recommended for you and due dates:  Health Maintenance  Topic Date Due   Zoster (Shingles) Vaccine (1 of 2) 09/03/2023*   Medicare Annual Wellness Visit  05/28/2024   Mammogram  08/19/2024   Cologuard (Stool DNA test)  07/17/2025   DTaP/Tdap/Td vaccine (2 - Td or Tdap) 05/15/2028   Pneumonia Vaccine  Completed   Flu Shot  Completed   DEXA scan (bone density measurement)  Completed   Hepatitis C Screening  Completed   HPV Vaccine  Aged Out   Colon Cancer Screening  Discontinued   COVID-19 Vaccine  Discontinued  *Topic was postponed. The date shown is not the original due date.    Advanced directives: (Copy  Requested) Please bring a copy of your health care power of attorney and living will to the office to be added to your chart at your convenience.  Next Medicare Annual Wellness Visit scheduled for next year: Yes

## 2023-05-29 NOTE — Progress Notes (Signed)
 Subjective:   Andrea [name] is a 73 y.o. female who presents for Medicare Annual (Subsequent) preventive examination.  Visit Complete: Virtual I connected with  [name] on 05/29/23 by a audio enabled telemedicine application and verified that I am speaking with the correct person using two identifiers.  Patient Location: Home  Provider Location: Office/Clinic  I discussed the limitations of evaluation and management by telemedicine. The patient expressed understanding and agreed to proceed.  Vital Signs: Because this visit was a virtual/telehealth visit, some criteria may be missing or patient reported. Any vitals not documented were not able to be obtained and vitals that have been documented are patient reported.   Cardiac Risk Factors include: advanced age (>73men, >77 women);hypertension;Other (see comment), Risk factor comments: TIA (transient ischemic attack), COPD, CKD     Objective:    Today's Vitals   05/29/23 1054  Weight: 108 lb (49 kg)  Height: 5' (1.524 m)   Body mass index is 21.09 kg/m.     05/29/2023   11:01 AM 04/14/2023    8:49 PM 05/27/2022   11:09 AM 06/19/2021   11:40 AM 05/25/2021   10:45 AM 05/15/2018    5:58 AM 07/26/2016   10:18 AM  Advanced Directives  Does Patient Have a Medical Advance Directive? Yes No No No Yes No No  Type of Estate Agent of [place];Living will    Living will;Healthcare Power of Attorney    Does patient want to make changes to medical advance directive?     No - Patient declined    Copy of Healthcare Power of Attorney in Chart? No - copy requested    No - copy requested    Would patient like information on creating a medical advance directive?  No - Patient declined No - Patient declined No - Patient declined  No - Patient declined     Current Medications (verified) Outpatient Encounter Medications as of 05/29/2023  Medication Sig   albuterol  (VENTOLIN  HFA) 108 (90 Base) MCG/ACT inhaler Inhale 2  puffs into the lungs every 6 (six) hours as needed for wheezing or shortness of breath.   allopurinol  (ZYLOPRIM ) 300 MG tablet TAKE 1 TABLET BY MOUTH EVERY DAY   ALPRAZolam  (XANAX ) 0.25 MG tablet TAKE 1 TABLET(0.25 MG) BY MOUTH DAILY   aspirin  EC 81 MG tablet Take 1 tablet (81 mg total) by mouth daily.   atorvastatin  (LIPITOR) 20 MG tablet TAKE 1 TABLET BY MOUTH EVERY DAY   Cholecalciferol (THERA-D 2000) 50 MCG (2000 UT) TABS 1 tab by mouth once daily   [facility] VITAMIN B12 1000 MCG tablet TAKE 1 TABLET BY MOUTH EVERY DAY   fenofibrate  160 MG tablet Take 1 tablet (160 mg total) by mouth daily.   gabapentin  (NEURONTIN ) 100 MG capsule Take 1-3 capsules (100-300 mg total) by mouth at bedtime as needed (nerve pain).   triamterene -hydrochlorothiazide (MAXZIDE-25) 37.5-25 MG tablet Take 1 tablet by mouth daily.   WIXELA INHUB 250-50 MCG/ACT AEPB INHALE 1 PUFF INTO THE LUNGS IN THE MORNING AND AT BEDTIME.   potassium chloride  (KLOR-CON ) 10 MEQ tablet Take 1 tablet (10 mEq total) by mouth daily for 7 days.   No facility-administered encounter medications on file as of 05/29/2023.    Allergies (verified) Alendronate  sodium, Zyrtec  [cetirizine ], and Sertraline hcl   History: Past Medical History:  Diagnosis Date   Allergic rhinitis, cause unspecified 08/07/2012   COPD 03/23/2008   Cough 11/05/2007   HYPERTENSION 02/10/2007   MUSCLE CRAMPS 08/09/2009  OSTEOPOROSIS 02/10/2007   Other and unspecified hyperlipidemia 08/07/2012   PEPTIC ULCER DISEASE 03/23/2008   PNEUMONIA, RIGHT UPPER LOBE 03/23/2008   RESTLESS LEG SYNDROME 03/23/2008   Unspecified disorder of thyroid  11/16/2007   Past Surgical History:  Procedure Laterality Date   Breast Biopsy negative  1972   BREAST EXCISIONAL BIOPSY Right    Family History  Problem Relation Age of Onset   Heart disease Mother    Leukemia Father    Breast cancer Daughter 39   Social History   Socioeconomic History   Marital status: Married    Spouse name:  Quintin   Number of children: 2   Years of education: Not on file   Highest education level: Not on file  Occupational History   Occupation: RETIRED  Tobacco Use   Smoking status: Former    Current packs/day: 0.00    Average packs/day: 1 pack/day for 39.0 years (39.0 ttl pk-yrs)    Types: Cigarettes    Start date: 04/22/1968    Quit date: 04/23/2007    Years since quitting: 16.1   Smokeless tobacco: Never  Vaping Use   Vaping status: Never Used  Substance and Sexual Activity   Alcohol use: Yes    Comment: Occassionally.   Drug use: No   Sexual activity: Not Currently  Other Topics Concern   Not on file  Social History Narrative   Lives with husband   Social Drivers of Health   Financial Resource Strain: Low Risk  (05/29/2023)   Overall Financial Resource Strain (CARDIA)    Difficulty of Paying Living Expenses: Not very hard  Food Insecurity: No Food Insecurity (05/29/2023)   Hunger Vital Sign    Worried About Running Out of Food in the Last Year: Never true    Ran Out of Food in the Last Year: Never true  Transportation Needs: No Transportation Needs (05/29/2023)   PRAPARE - Administrator, Civil Service (Medical): No    Lack of Transportation (Non-Medical): No  Physical Activity: Insufficiently Active (05/29/2023)   Exercise Vital Sign    Days of Exercise per Week: 2 days    Minutes of Exercise per Session: 30 min  Stress: Stress Concern Present (05/29/2023)   [employer] of Occupational Health - Occupational Stress Questionnaire    Feeling of Stress : Very much  Social Connections: Socially Integrated (05/29/2023)   Social Connection and Isolation Panel [NHANES]    Frequency of Communication with Friends and Family: More than three times a week    Frequency of Social Gatherings with Friends and Family: More than three times a week    Attends Religious Services: More than 4 times per year    Active Member of [employer] or Organizations: Yes    Attends Tax Inspector Meetings: Never    Marital Status: Married    Tobacco Counseling Counseling given: Not Answered   Clinical Intake:  Pre-visit preparation completed: Yes  Pain : No/denies pain     BMI - recorded: 21.09 Nutritional Status: BMI of 19-24  Normal Nutritional Risks: None Diabetes: No  How often do you need to have someone help you when you read instructions, pamphlets, or other written materials from your doctor or pharmacy?: 1 - Never  Interpreter Needed?: No  Information entered by :: Rondrick Barreira, RMA   Activities of Daily Living    05/29/2023   10:55 AM  In your present state of health, do you have any difficulty performing the following activities:  Hearing? 0  Vision? 0  Difficulty concentrating or making decisions? 0  Walking or climbing stairs? 1  Dressing or bathing? 0  Doing errands, shopping? 0  Preparing Food and eating ? N  Using the Toilet? N  In the past six months, have you accidently leaked urine? N  Do you have problems with loss of bowel control? N  Managing your Medications? N  Managing your Finances? N  Housekeeping or managing your Housekeeping? N    Patient Care Team: Norleen Lynwood ORN, MD as PCP - Diedre Daniels, [facility] [place], [place] as Consulting Physician (Optometry)  Indicate any recent Medical Services you may have received from other than [facility] providers in the past year (date may be approximate).     Assessment:   This is a routine wellness examination for [place].  Hearing/Vision screen Hearing Screening - Comments:: Denies hearing difficulties   Vision Screening - Comments:: Wears eyeglasses   Goals Addressed             This Visit's Progress    My goal for 2024 is to maintain my health.   On track     Depression Screen    05/29/2023   11:05 AM 04/28/2023    3:07 PM 01/20/2023    9:57 AM 09/17/2022   10:57 AM 05/27/2022   11:11 AM 05/25/2021   10:52 AM 10/12/2020    9:59 AM  PHQ 2/9 Scores  PHQ - 2 Score 0 0 0 0 0 0 1  PHQ-  9 Score 3          Fall Risk    05/29/2023   11:02 AM 04/28/2023    3:07 PM 01/20/2023    9:57 AM 09/17/2022   10:57 AM 05/27/2022   11:10 AM  Fall Risk   Falls in the past year? 1 0 0 0 0  Number falls in past yr: 0 0 0 0 0  Injury with Fall? 1 0 0 0 0  Risk for fall due to :  No Fall Risks No Fall Risks No Fall Risks No Fall Risks  Follow up Falls evaluation completed;Falls prevention discussed Falls evaluation completed Falls evaluation completed Falls evaluation completed Falls prevention discussed    MEDICARE RISK AT HOME: Medicare Risk at Home Any stairs in or around the home?: Yes If so, are there any without handrails?: Yes Home free of loose throw rugs in walkways, pet beds, electrical cords, etc?: Yes Adequate lighting in your home to reduce risk of falls?: Yes Life alert?: No Use of a cane, walker or w/c?: Yes (both cane and walker) Grab bars in the bathroom?: Yes Shower chair or bench in shower?: Yes Elevated toilet seat or a handicapped toilet?: Yes  TIMED UP AND GO:  Was the test performed?  No    Cognitive Function:        05/27/2022   11:10 AM  6CIT Screen  What Year? 0 points  What month? 0 points  What time? 0 points  Count back from 20 0 points  Months in reverse 0 points  Repeat phrase 0 points  Total Score 0 points    Immunizations Immunization History  Administered Date(s) Administered   Fluad Quad(high Dose 65+) 12/16/2018, 12/25/2021   Fluad Trivalent(High Dose 65+) 01/21/2023   Hepatitis B, ADULT 04/05/2014, 12/07/2014   Influenza Whole 01/20/2009   Influenza, High Dose Seasonal PF 01/25/2021   Influenza,inj,Quad PF,6+ Mos 02/24/2014   Influenza-Unspecified 01/20/2017, 02/22/2020   Moderna Covid-19 Fall Seasonal Vaccine 52yrs &  older 01/21/2023   PFIZER Comirnaty(Gray Top)Covid-19 Tri-Sucrose Vaccine 07/26/2020   PFIZER(Purple Top)SARS-COV-2 Vaccination 05/13/2019, 06/02/2019, 01/21/2020   PNEUMOCOCCAL CONJUGATE-20 12/25/2021    Pneumococcal Conjugate-13 04/09/2017   Pneumococcal Polysaccharide-23 08/09/2009, 06/25/2018   Tdap 05/15/2018    TDAP status: Up to date  Flu Vaccine status: Up to date  Pneumococcal vaccine status: Up to date  Covid-19 vaccine status: Completed vaccines  Qualifies for Shingles Vaccine? Yes   Zostavax completed No   Shingrix Completed?: No.    Education has been provided regarding the importance of this vaccine. Patient has been advised to call insurance company to determine out of pocket expense if they have not yet received this vaccine. Advised may also receive vaccine at local pharmacy or Health Dept. Verbalized acceptance and understanding.  Screening Tests Health Maintenance  Topic Date Due   Zoster Vaccines- Shingrix (1 of 2) 09/03/2023 (Originally 08/15/2001)   Medicare Annual Wellness (AWV)  05/28/2024   MAMMOGRAM  08/19/2024   Fecal DNA (Cologuard)  07/17/2025   DTaP/Tdap/Td (2 - Td or Tdap) 05/15/2028   Pneumonia Vaccine 9+ Years old  Completed   INFLUENZA VACCINE  Completed   DEXA SCAN  Completed   Hepatitis C Screening  Completed   HPV VACCINES  Aged Out   Colonoscopy  Discontinued   COVID-19 Vaccine  Discontinued    Health Maintenance  There are no preventive care reminders to display for this patient.   Colorectal cancer screening: Type of screening: Cologuard. Completed 07/18/22. Repeat every 3 years  Mammogram status: Completed 08/20/2022. Repeat every year  Bone Density status: Ordered 05/29/2023. Pt provided with contact info and advised to call to schedule appt.  Lung Cancer Screening: (Low Dose CT Chest recommended if Age 48-80 years, 20 pack-year currently smoking OR have quit w/in 15years.) does not qualify.   Lung Cancer Screening Referral: N/A  Additional Screening:  Hepatitis C Screening: does qualify; Completed 06/19/2021  Vision Screening: Recommended annual ophthalmology exams for early detection of glaucoma and other disorders of the  eye. Is the patient up to date with their annual eye exam?  No  Who is the provider or what is the name of the office in which the patient attends annual eye exams? Dr. Lad[facility]) If pt is not established with a provider, would they like to be referred to a provider to establish care? No .   Dental Screening: Recommended annual dental exams for proper oral hygiene   Community Resource Referral / Chronic Care Management: CRR required this visit?  No   CCM required this visit?  No     Plan:     I have personally reviewed and noted the following in the patient's chart:   Medical and social history Use of alcohol, tobacco or illicit drugs  Current medications and supplements including opioid prescriptions. Patient is not currently taking opioid prescriptions. Functional ability and status Nutritional status Physical activity Advanced directives List of other physicians Hospitalizations, surgeries, and ER visits in previous 12 months Vitals Screenings to include cognitive, depression, and falls Referrals and appointments  In addition, I have reviewed and discussed with patient certain preventive protocols, quality metrics, and best practice recommendations. A written personalized care plan for preventive services as well as general preventive health recommendations were provided to patient.     Ethne Jeon L Brae Schaafsma, CMA   05/29/2023   After Visit Summary: (MyChart) Due to this being a telephonic visit, the after visit summary with patients personalized plan was offered to  patient via MyChart   Nurse Notes: Patient is due for a Shingrix vaccine.  She is also due for a DEXA by October 2025.  Order has been placed today.  Patient stated that she has an eye doctor appointment scheduled for next week.  Also patient stated that she has been wearing a heart monitor since her fall in December 2024.  She had no other concerns to address today.

## 2023-06-10 ENCOUNTER — Ambulatory Visit: Payer: Medicare Other | Attending: Internal Medicine

## 2023-06-10 DIAGNOSIS — R55 Syncope and collapse: Secondary | ICD-10-CM

## 2023-06-11 ENCOUNTER — Encounter: Payer: Self-pay | Admitting: Internal Medicine

## 2023-06-20 ENCOUNTER — Telehealth: Payer: Self-pay

## 2023-06-20 DIAGNOSIS — J449 Chronic obstructive pulmonary disease, unspecified: Secondary | ICD-10-CM

## 2023-06-20 NOTE — Telephone Encounter (Signed)
 Copied from CRM [id number]. Topic: Clinical - Medication Question >> Jun 20, 2023  1:07 PM Deaijah H wrote: Reason for CRM: Patient husband called in due to cost of medication WIXELA INHUB 250-50 MCG/ACT AEPB is high and would like to know if Dr. Jonny Ruiz could prescribe something less expensive. Has 2 left. If needed, please call [phone]

## 2023-06-23 MED ORDER — FLUTICASONE-SALMETEROL 250-50 MCG/ACT IN AEPB: 1.0000 | INHALATION_SPRAY | Freq: Two times a day (BID) | RESPIRATORY_TRACT | 3 refills | Status: AC

## 2023-06-23 NOTE — Addendum Note (Signed)
 Addended by: Corwin Levins on: 06/23/2023 07:45 PM   Modules accepted: Orders

## 2023-06-23 NOTE — Telephone Encounter (Signed)
 Ok wixela changed to advair which is essentially the same.  Hopefully less expensive   thanks

## 2023-06-24 NOTE — Telephone Encounter (Signed)
 See below

## 2023-06-24 NOTE — Telephone Encounter (Signed)
 Ok , will need referral to [facility] pharmacist to assist

## 2023-06-24 NOTE — Addendum Note (Signed)
 Addended by: Corwin Levins on: 06/24/2023 12:23 PM   Modules accepted: Orders

## 2023-06-25 ENCOUNTER — Telehealth: Payer: Self-pay

## 2023-06-25 NOTE — Progress Notes (Signed)
 Care Guide Pharmacy Note  06/25/2023 Name: Andrea [name] MRN: [id number] DOB: 1950-10-23  Referred By: Corwin Levins, MD Reason for referral: Care Coordination (Outreach to schedule with Pharm d )   Andrea [name] is a 73 y.o. year old female who is a primary care patient of Corwin Levins, MD.  [name] was referred to the pharmacist for assistance related to: COPD  Successful contact was made with the patient to discuss pharmacy services including being ready for the pharmacist to call at least 5 minutes before the scheduled appointment time and to have medication bottles and any blood pressure readings ready for review. The patient agreed to meet with the pharmacist via telephone visit on (date/time).07/08/2023  Penne Lash , RMA     [place]  [facility] Guide  Direct Dial: [phone]  Website: [email or web address]

## 2023-06-26 NOTE — Telephone Encounter (Signed)
 Called and left voice mail

## 2023-07-08 ENCOUNTER — Other Ambulatory Visit: Payer: Self-pay | Admitting: Internal Medicine

## 2023-07-08 ENCOUNTER — Other Ambulatory Visit

## 2023-07-13 ENCOUNTER — Other Ambulatory Visit: Payer: Self-pay | Admitting: Internal Medicine

## 2023-07-13 DIAGNOSIS — E7800 Pure hypercholesterolemia, unspecified: Secondary | ICD-10-CM

## 2023-07-14 ENCOUNTER — Other Ambulatory Visit: Payer: Self-pay

## 2023-07-16 ENCOUNTER — Telehealth: Payer: Self-pay | Admitting: Internal Medicine

## 2023-07-16 NOTE — Telephone Encounter (Unsigned)
 Copied from CRM [id number]. Topic: Clinical - Medication Refill >> Jul 16, 2023 11:03 AM Marya Fossa L wrote: Most Recent Primary Care Visit:  Provider: Wyvonne Lenz  Department: [facility]  Visit Type: MEDICARE AWV, SEQUENTIAL  Date: 05/29/2023  Medication: fenofibrate 160 MG tablet  Has the patient contacted their pharmacy? Yes (Agent: If no, request that the patient contact the pharmacy for the refill. If patient does not wish to contact the pharmacy document the reason why and proceed with request.) (Agent: If yes, when and what did the pharmacy advise?)  Is this the correct pharmacy for this prescription? Yes If no, delete pharmacy and type the correct one.  This is the patient's preferred pharmacy:  [id number] [place], [place] - [address] [place] [address] Phone: [phone] Fax: [phone]  [id number] [place], [place] - [address] [place] [address] Phone: [phone] Fax: [phone]   Has the prescription been filled recently? Yes  Is the patient out of the medication? No  Has the patient been seen for an appointment in the last year OR does the patient have an upcoming appointment? Yes  Can we respond through MyChart? Yes  Agent: Please be advised that Rx refills may take up to 3 business days. We ask that you follow-up with your pharmacy.

## 2023-07-23 ENCOUNTER — Other Ambulatory Visit (INDEPENDENT_AMBULATORY_CARE_PROVIDER_SITE_OTHER): Admitting: Pharmacist

## 2023-07-23 ENCOUNTER — Other Ambulatory Visit: Payer: Self-pay | Admitting: Internal Medicine

## 2023-07-23 DIAGNOSIS — Z0000 Encounter for general adult medical examination without abnormal findings: Secondary | ICD-10-CM

## 2023-07-23 DIAGNOSIS — J449 Chronic obstructive pulmonary disease, unspecified: Secondary | ICD-10-CM

## 2023-07-23 NOTE — Progress Notes (Unsigned)
   07/25/2023 Name: Andrea [name] MRN: [id number] DOB: 1950/11/08  Chief Complaint  Patient presents with   Medication Access    Andrea [name] is a 73 y.o. year old female who presented for a telephone visit.   They were referred to the pharmacist by their PCP for assistance in managing medication access.   She has been unable to afford her inhaler.  Reviewed pt's insurance, she has a $255 deductible that must be met prior to the price going down for a copay of $47 per month. She notes she is unable to afford the deductible or copay.  Only inhaler option for PAP is Breztri. She may also qualify for LIS however pt was not comfortable giving information needed over the phone.  Will send information via MyChart for her to review and look into.   Arbutus Leas, PharmD, BCPS, CPP Clinical Pharmacist Practitioner [facility] [phone]

## 2023-08-21 ENCOUNTER — Ambulatory Visit
Admission: RE | Admit: 2023-08-21 | Discharge: 2023-08-21 | Disposition: A | Discharge status: Home / Self Care | Source: Ambulatory Visit | Attending: Internal Medicine | Admitting: Internal Medicine

## 2023-08-21 DIAGNOSIS — Z0000 Encounter for general adult medical examination without abnormal findings: Secondary | ICD-10-CM

## 2023-08-21 DIAGNOSIS — Z1231 Encounter for screening mammogram for malignant neoplasm of breast: Secondary | ICD-10-CM | POA: Diagnosis not present

## 2023-08-26 ENCOUNTER — Other Ambulatory Visit: Payer: Self-pay | Admitting: Internal Medicine

## 2023-08-26 DIAGNOSIS — R928 Other abnormal and inconclusive findings on diagnostic imaging of breast: Secondary | ICD-10-CM

## 2023-09-09 ENCOUNTER — Ambulatory Visit
Admission: RE | Admit: 2023-09-09 | Discharge: 2023-09-09 | Disposition: A | Discharge status: Home / Self Care | Source: Ambulatory Visit | Attending: Internal Medicine | Admitting: Internal Medicine

## 2023-09-09 DIAGNOSIS — R928 Other abnormal and inconclusive findings on diagnostic imaging of breast: Secondary | ICD-10-CM

## 2023-09-09 DIAGNOSIS — N6315 Unspecified lump in the right breast, overlapping quadrants: Secondary | ICD-10-CM | POA: Diagnosis not present

## 2023-09-23 ENCOUNTER — Encounter: Payer: Self-pay | Admitting: Internal Medicine

## 2023-09-23 ENCOUNTER — Ambulatory Visit (INDEPENDENT_AMBULATORY_CARE_PROVIDER_SITE_OTHER): Admitting: Internal Medicine

## 2023-09-23 VITALS — BP 148/82 | HR 125 | Temp 98.7°F | Ht 60.0 in | Wt 103.0 lb

## 2023-09-23 DIAGNOSIS — E785 Hyperlipidemia, unspecified: Secondary | ICD-10-CM | POA: Diagnosis not present

## 2023-09-23 DIAGNOSIS — N1831 Chronic kidney disease, stage 3a: Secondary | ICD-10-CM | POA: Diagnosis not present

## 2023-09-23 DIAGNOSIS — E538 Deficiency of other specified B group vitamins: Secondary | ICD-10-CM | POA: Diagnosis not present

## 2023-09-23 DIAGNOSIS — I10 Essential (primary) hypertension: Secondary | ICD-10-CM

## 2023-09-23 DIAGNOSIS — E559 Vitamin D deficiency, unspecified: Secondary | ICD-10-CM | POA: Diagnosis not present

## 2023-09-23 DIAGNOSIS — J449 Chronic obstructive pulmonary disease, unspecified: Secondary | ICD-10-CM

## 2023-09-23 MED ORDER — ALPRAZOLAM 0.25 MG PO TABS: ORAL_TABLET | ORAL | 2 refills | Status: DC

## 2023-09-23 MED ORDER — TRIAMTERENE-HCTZ 37.5-25 MG PO TABS: 1.0000 | ORAL_TABLET | Freq: Every day | ORAL | 3 refills | Status: AC

## 2023-09-23 NOTE — Assessment & Plan Note (Signed)
 BP Readings from Last 3 Encounters:  09/23/23 (!) 148/82  04/28/23 136/84  04/14/23 (!) 150/104   Uncontrolled, pt states controlled at home, declines changes, to continue maxide 37.5 25,

## 2023-09-23 NOTE — Progress Notes (Signed)
 Patient ID: Andrea [name], female   DOB: 09-Jun-1950, 73 y.o.   MRN: [id number]        Chief Complaint: follow up htn, fatigue, dyslipidemia, copd       HPI:  Andrea [name] is a 73 y.o. female here overall doing ok, Pt denies chest pain, increased sob or doe, wheezing, orthopnea, PND, increased LE swelling, palpitations, dizziness or syncope.   Pt denies polydipsia, polyuria, or new focal neuro s/s.    Pt denies fever, wt loss, night sweats, loss of appetite, or other constitutional symptoms  Does c/o ongoing fatigue, but denies signficant daytime hypersomnolence.         Wt Readings from Last 3 Encounters:  09/23/23 103 lb (46.7 kg)  05/29/23 108 lb (49 kg)  04/28/23 108 lb (49 kg)   BP Readings from Last 3 Encounters:  09/23/23 (!) 148/82  04/28/23 136/84  04/14/23 (!) 150/104         Past Medical History:  Diagnosis Date   Allergic rhinitis, cause unspecified 08/07/2012   COPD 03/23/2008   Cough 11/05/2007   HYPERTENSION 02/10/2007   MUSCLE CRAMPS 08/09/2009   OSTEOPOROSIS 02/10/2007   Other and unspecified hyperlipidemia 08/07/2012   PEPTIC ULCER DISEASE 03/23/2008   PNEUMONIA, RIGHT UPPER LOBE 03/23/2008   RESTLESS LEG SYNDROME 03/23/2008   Unspecified disorder of thyroid  11/16/2007   Past Surgical History:  Procedure Laterality Date   Breast Biopsy negative  1972   BREAST EXCISIONAL BIOPSY Right     reports that she quit smoking about 16 years ago. Her smoking use included cigarettes. She started smoking about 55 years ago. She has a 39 pack-year smoking history. She has never used smokeless tobacco. She reports current alcohol use. She reports that she does not use drugs. family history includes Breast cancer (age of onset: 61) in her daughter; Heart disease in her mother; Leukemia in her father. Allergies  Allergen Reactions   Alendronate Sodium Nausea Only    GI upset   Zyrtec  [Cetirizine ] Itching, Rash and Other (See Comments)    Tongue burning, thrush   Sertraline  Hcl Other (See Comments)    unknown   Current Outpatient Medications on File Prior to Visit  Medication Sig Dispense Refill   albuterol  (VENTOLIN  HFA) 108 (90 Base) MCG/ACT inhaler Inhale 2 puffs into the lungs every 6 (six) hours as needed for wheezing or shortness of breath. 36 g 3   allopurinol  (ZYLOPRIM ) 300 MG tablet TAKE 1 TABLET BY MOUTH EVERY DAY 90 tablet 3   aspirin  EC 81 MG tablet Take 1 tablet (81 mg total) by mouth daily. 30 tablet 5   atorvastatin  (LIPITOR) 20 MG tablet TAKE 1 TABLET BY MOUTH EVERY DAY 90 tablet 2   Cholecalciferol (THERA-D 2000) 50 MCG (2000 UT) TABS 1 tab by mouth once daily 90 tablet 99   [facility] VITAMIN B12 1000 MCG tablet TAKE 1 TABLET BY MOUTH EVERY DAY 90 tablet 1   fluticasone -salmeterol (ADVAIR ) 250-50 MCG/ACT AEPB Inhale 1 puff into the lungs in the morning and at bedtime. 180 each 3   gabapentin  (NEURONTIN ) 100 MG capsule Take 1-3 capsules (100-300 mg total) by mouth at bedtime as needed (nerve pain). 90 capsule 0   No current facility-administered medications on file prior to visit.        ROS:  All others reviewed and negative.  Objective        PE:  BP (!) 148/82 (BP Location: Right Arm, Patient Position: Sitting, Cuff Size:  Normal)   Pulse (!) 125   Temp 98.7 F (37.1 C) (Oral)   Ht 5' (1.524 m)   Wt 103 lb (46.7 kg)   SpO2 97%   BMI 20.12 kg/m                 Constitutional: Pt appears in NAD               HENT: Head: NCAT.       cn         Right Ear: External ear normal.                 Left Ear: External ear normal.                Eyes: . Pupils are equal, round, and reactive to light. Conjunctivae and EOM are normal               Nose: without d/c or deformity               Neck: Neck supple. Gross normal ROM               Cardiovascular: Normal rate and regular rhythm.                 Pulmonary/Chest: Effort normal and breath sounds without rales or wheezing.                Abd:  Soft, NT, ND, + BS, no organomegaly                Neurological: Pt is alert. At baseline orientation, motor grossly intact               Skin: Skin is warm. No rashes, no other new lesions, LE edema - none               Psychiatric: Pt behavior is normal without agitation   Micro: none  Cardiac tracings I have personally interpreted today:  none  Pertinent Radiological findings (summarize): none   Lab Results  Component Value Date   WBC 4.2 04/28/2023   HGB 13.2 04/28/2023   HCT 39.6 04/28/2023   PLT 233.0 04/28/2023   GLUCOSE 87 04/28/2023   CHOL 214 (H) 04/28/2023   TRIG (H) 04/28/2023    495.0 Triglyceride is over 400; calculations on Lipids are invalid.   HDL 82.00 04/28/2023   LDLDIRECT 66.0 04/28/2023   LDLCALC 114 (H) 04/10/2020   ALT 19 04/28/2023   AST 48 (H) 04/28/2023   NA 143 04/28/2023   K 4.2 04/28/2023   CL 106 04/28/2023   CREATININE 0.94 04/28/2023   BUN 26 (H) 04/28/2023   CO2 27 04/28/2023   TSH 1.07 04/28/2023   INR 1.00 02/11/2014   HGBA1C 5.3 04/28/2023   Assessment/Plan:  Andrea [name] is a 73 y.o. Black or African American [2] female with  has a past medical history of Allergic rhinitis, cause unspecified (08/07/2012), COPD (03/23/2008), Cough (11/05/2007), HYPERTENSION (02/10/2007), MUSCLE CRAMPS (08/09/2009), OSTEOPOROSIS (02/10/2007), Other and unspecified hyperlipidemia (08/07/2012), PEPTIC ULCER DISEASE (03/23/2008), PNEUMONIA, RIGHT UPPER LOBE (03/23/2008), RESTLESS LEG SYNDROME (03/23/2008), and Unspecified disorder of thyroid  (11/16/2007).  COPD (chronic obstructive pulmonary disease) Stable overall, continue inhaler prn  Essential hypertension BP Readings from Last 3 Encounters:  09/23/23 (!) 148/82  04/28/23 136/84  04/14/23 (!) 150/104   Uncontrolled, pt states controlled at home, declines changes, to continue maxide 37.5 25,    Dyslipidemia With moderate elvated triglycerides uncontrolled, pt declines  to take fenofibrate , for lower fat lower chol diet  CKD (chronic kidney  disease) Lab Results  Component Value Date   CREATININE 0.94 04/28/2023   Stable overall, cont to avoid nephrotoxins   Vitamin D  deficiency Last vitamin D  Lab Results  Component Value Date   VD25OH 51.51 04/28/2023   Stable, cont oral replacement   B12 deficiency Lab Results  Component Value Date   VITAMINB12 >1537 (H) 04/28/2023   Stable, cont oral replacement - b12 1000 mcg qd  Followup: Return in about 6 months (around 03/24/2024).  Rosalia Colonel, MD 09/23/2023 8:57 PM [facility]

## 2023-09-23 NOTE — Assessment & Plan Note (Signed)
 Lab Results  Component Value Date   VITAMINB12 >1537 (H) 04/28/2023   Stable, cont oral replacement - b12 1000 mcg qd

## 2023-09-23 NOTE — Assessment & Plan Note (Signed)
Stable overall, continue inhaler prn 

## 2023-09-23 NOTE — Assessment & Plan Note (Signed)
 With moderate elvated triglycerides uncontrolled, pt declines to take fenofibrate , for lower fat lower chol diet

## 2023-09-23 NOTE — Patient Instructions (Addendum)
 Please have your Shingrix (shingles) shots done at your local pharmacy.  Please continue all other medications as before, and refills have been done if requested.  Please have the pharmacy call with any other refills you may need.  Please continue your efforts at being more active, low fat and cholesterol diet  You are otherwise up to date with prevention measures today.  Please keep your appointments with your specialists as you may have planned  Please make an Appointment to return in 6 months, or sooner if needed

## 2023-09-23 NOTE — Assessment & Plan Note (Signed)
 Last vitamin D  Lab Results  Component Value Date   VD25OH 51.51 04/28/2023   Stable, cont oral replacement

## 2023-09-23 NOTE — Assessment & Plan Note (Signed)
 Lab Results  Component Value Date   CREATININE 0.94 04/28/2023   Stable overall, cont to avoid nephrotoxins

## 2023-11-05 ENCOUNTER — Other Ambulatory Visit: Payer: Self-pay | Admitting: Internal Medicine

## 2024-01-27 ENCOUNTER — Other Ambulatory Visit: Payer: Medicare Other

## 2024-02-02 ENCOUNTER — Ambulatory Visit: Payer: Self-pay | Admitting: Internal Medicine

## 2024-02-02 ENCOUNTER — Ambulatory Visit (HOSPITAL_BASED_OUTPATIENT_CLINIC_OR_DEPARTMENT_OTHER)
Admission: RE | Admit: 2024-02-02 | Discharge: 2024-02-02 | Disposition: A | Discharge status: Home / Self Care | Source: Ambulatory Visit | Attending: Internal Medicine | Admitting: Internal Medicine

## 2024-02-02 DIAGNOSIS — Z780 Asymptomatic menopausal state: Secondary | ICD-10-CM | POA: Insufficient documentation

## 2024-02-04 ENCOUNTER — Other Ambulatory Visit: Payer: Self-pay | Admitting: Internal Medicine

## 2024-02-04 MED ORDER — ALENDRONATE SODIUM 70 MG PO TABS: 70.0000 mg | ORAL_TABLET | ORAL | 3 refills | Status: AC

## 2024-02-04 NOTE — Telephone Encounter (Signed)
 Copied from CRM #[id number]. Topic: General - Other >> Feb 04, 2024 12:05 PM Andrea [name] wrote: Reason for CRM: patient called stating the provider wanted her to start taking calcium  but she did not know how much [phone]

## 2024-02-16 ENCOUNTER — Telehealth: Payer: Self-pay

## 2024-02-16 NOTE — Telephone Encounter (Signed)
 Oh yes, please let pt know if she has been taking daily by accident, I believe she can hold for 1 month, then restart at one pill per wk only    thanks

## 2024-02-16 NOTE — Telephone Encounter (Signed)
 Copied from CRM #[id number]. Topic: Clinical - Medication Question >> Feb 16, 2024 11:58 AM Maisie C wrote: Reason for CRM: pt called to find out why she can't have her med, alendronate, refilled until May 18, 2024. She only has 3 pills left. She asked for a nurse to call her back to discuss this. Please call and advise.

## 2024-02-17 NOTE — Telephone Encounter (Signed)
 Pt states understanding no further questions at this time.

## 2024-02-20 ENCOUNTER — Telehealth: Payer: Self-pay

## 2024-02-20 ENCOUNTER — Ambulatory Visit: Payer: Self-pay

## 2024-02-20 NOTE — Telephone Encounter (Signed)
 Please see recent NT encounter.

## 2024-02-20 NOTE — Telephone Encounter (Signed)
 Patient called 3 times without success. Messages left for the patient to call back. Routing to clinic.

## 2024-02-20 NOTE — Telephone Encounter (Signed)
 Patient call was marked as pink to assess difficulty swallowing. This RN made first attempt to triage patient. No answer, LVM. Routing for additional attempts.   Copied from CRM [id number]. Topic: Clinical - Medication Refill >> Feb 20, 2024 11:38 AM Suzen RAMAN wrote: Medication: Calcium - smaller pill    Has the patient contacted their pharmacy? Yes   This is the patient's preferred pharmacy:  [phone] [address] [place] - [address] [place] [address] Phone: [phone] Fax: [phone]   Is this the correct pharmacy for this prescription? Yes If no, delete pharmacy and type the correct one.    Has the prescription been filled recently? No   Is the patient out of the medication? Yes   Has the patient been seen for an appointment in the last year OR does the patient have an upcoming appointment? Yes   Can we respond through MyChart? Yes   Agent: Please be advised that Rx refills may take up to 3 business days. We ask that you follow-up with your pharmacy.

## 2024-02-20 NOTE — Telephone Encounter (Unsigned)
 Copied from CRM [id number]. Topic: Clinical - Medication Refill >> Feb 20, 2024 11:38 AM Suzen RAMAN wrote: Medication: Calcium - smaller pill   Has the patient contacted their pharmacy? Yes  This is the patient's preferred pharmacy:  [phone] [address], [place] - [address] [place] [address] Phone: [phone] Fax: [phone]  Is this the correct pharmacy for this prescription? Yes If no, delete pharmacy and type the correct one.   Has the prescription been filled recently? No  Is the patient out of the medication? Yes  Has the patient been seen for an appointment in the last year OR does the patient have an upcoming appointment? Yes  Can we respond through MyChart? Yes  Agent: Please be advised that Rx refills may take up to 3 business days. We ask that you follow-up with your pharmacy.

## 2024-02-26 ENCOUNTER — Telehealth: Payer: Self-pay

## 2024-02-26 NOTE — Telephone Encounter (Signed)
 Copied from CRM [id number]. Topic: Clinical - Medication Question >> Feb 26, 2024  1:08 PM Alfonso HERO wrote: Reason for CRM: patient calling for the 4th time in regards to the Medication: Calcium - smaller pill Rx request. No one has reached out to her yet regarding this issue. She states the pills she currently have are too big and needing smaller ones. Can someone please reach out to her?

## 2024-02-27 NOTE — Telephone Encounter (Signed)
 I have placed a referral to see our pharmacist to help with this

## 2024-02-27 NOTE — Addendum Note (Signed)
 Addended by: ROSALVA LEX RAMAN on: 02/27/2024 09:52 AM   Modules accepted: Orders

## 2024-02-27 NOTE — Telephone Encounter (Signed)
 Please advise if there is a smaller capsule size for patient to take

## 2024-02-27 NOTE — Telephone Encounter (Signed)
 Very sorry, I am not aware of the pill sizes exactly, so please consider consulting the pharmacist who can often be quite helpful with this,   Thanks

## 2024-03-02 NOTE — Telephone Encounter (Signed)
 Patient called to follow up on her Calcium  pills. Please follow up with patient about pharmacist referral. Patient is saying that will just buy her medications over the counter if she she doesn't hear back from anyone.  Please call the patient back or send a message through MyChart to discuss with the patient or at least keep her updated.

## 2024-03-03 ENCOUNTER — Telehealth: Payer: Self-pay | Admitting: *Deleted

## 2024-03-03 NOTE — Progress Notes (Unsigned)
 Care Guide Pharmacy Note  03/03/2024 Name: Andrea [name] MRN: [id number] DOB: 06/04/1950  Referred By: Norleen Lynwood ORN, MD Reason for referral: Call Attempt #1 and Complex Care Management (Outreach to schedule referral with pharmacist )   Andrea [name] is a 72 y.o. year old female who is a primary care patient of Norleen Lynwood ORN, MD.  [name] was referred to the pharmacist for assistance related to: Vit D deficiency   An unsuccessful telephone outreach was attempted today to contact the patient who was referred to the pharmacy team for assistance with medication assistance. Additional attempts will be made to contact the patient.  Thedford Franks, CMA [place]  [facility], [facility] Guide Direct Dial: [phone]  Fax: [phone] Website: [email or web address]

## 2024-03-04 NOTE — Progress Notes (Unsigned)
 Care Guide Pharmacy Note  03/04/2024 Name: MITTIE KNITTEL MRN: [id number] DOB: November 27, 1950  Referred By: Norleen Lynwood ORN, MD Reason for referral: Call Attempt #1 and Complex Care Management (Outreach to schedule referral with pharmacist )   SHANAIYA BENE is a 73 y.o. year old female who is a primary care patient of Norleen Lynwood ORN, MD.  [name] was referred to the pharmacist for assistance related to: Vitamin D  deficiency   A second unsuccessful telephone outreach was attempted today to contact the patient who was referred to the pharmacy team for assistance with medication assistance. Additional attempts will be made to contact the patient.  Thedford Franks, CMA [place]  [facility], [facility] Guide Direct Dial: [phone]  Fax: [phone] Website: [email or web address]

## 2024-03-05 NOTE — Progress Notes (Signed)
 Care Guide Pharmacy Note  03/05/2024 Name: Andrea [name] MRN: [id number] DOB: 1951-01-30  Referred By: Norleen Lynwood ORN, MD Reason for referral: Call Attempt #1 and Complex Care Management (Outreach to schedule referral with pharmacist )   Andrea [name] is a 73 y.o. year old female who is a primary care patient of Norleen Lynwood ORN, MD.  Andrea [name] Niece was referred to the pharmacist for assistance related to: Vitamin D    A third unsuccessful telephone outreach was attempted today to contact the patient who was referred to the pharmacy team for assistance with medication management. The Population Health team is pleased to engage with this patient at any time in the future upon receipt of referral and should he/she be interested in assistance from the Population Health team.  Thedford Franks, CMA [facility]  [facility], [facility] Guide Direct Dial: [phone]  Fax: [phone] Website: [email or web address]

## 2024-03-16 ENCOUNTER — Ambulatory Visit: Payer: Self-pay

## 2024-03-16 ENCOUNTER — Encounter (HOSPITAL_COMMUNITY): Payer: Self-pay

## 2024-03-16 ENCOUNTER — Ambulatory Visit (HOSPITAL_COMMUNITY)
Admission: EM | Admit: 2024-03-16 | Discharge: 2024-03-16 | Disposition: A | Discharge status: Home / Self Care | Attending: Family Medicine | Admitting: Family Medicine

## 2024-03-16 ENCOUNTER — Ambulatory Visit (INDEPENDENT_AMBULATORY_CARE_PROVIDER_SITE_OTHER)

## 2024-03-16 DIAGNOSIS — R051 Acute cough: Secondary | ICD-10-CM | POA: Diagnosis not present

## 2024-03-16 DIAGNOSIS — R079 Chest pain, unspecified: Secondary | ICD-10-CM

## 2024-03-16 DIAGNOSIS — J441 Chronic obstructive pulmonary disease with (acute) exacerbation: Secondary | ICD-10-CM | POA: Diagnosis not present

## 2024-03-16 DIAGNOSIS — R000 Tachycardia, unspecified: Secondary | ICD-10-CM

## 2024-03-16 LAB — POC COVID19/FLU A&B COMBO
Covid Antigen, POC: NEGATIVE
Influenza A Antigen, POC: NEGATIVE
Influenza B Antigen, POC: NEGATIVE

## 2024-03-16 MED ORDER — AZITHROMYCIN 250 MG PO TABS: 250.0000 mg | ORAL_TABLET | Freq: Every day | ORAL | 0 refills | Status: AC

## 2024-03-16 MED ORDER — PREDNISONE 20 MG PO TABS: 40.0000 mg | ORAL_TABLET | Freq: Every day | ORAL | 0 refills | Status: AC

## 2024-03-16 MED ORDER — HYDROCODONE BIT-HOMATROP MBR 5-1.5 MG/5ML PO SOLN: 2.5000 mL | Freq: Four times a day (QID) | ORAL | 0 refills | Status: AC | PRN

## 2024-03-16 NOTE — ED Triage Notes (Signed)
 Patient here today with c/o productive cough, SOB, wheeze, chest pain, weakness, and fatigue X 2 days. Patient has taken Tylenol  last night. No known sick contacts.

## 2024-03-16 NOTE — Discharge Instructions (Signed)
 Be aware, your cough medication may cause drowsiness. Please do not drive, operate heavy machinery or make important decisions while on this medication, it can cloud your judgement.

## 2024-03-16 NOTE — Telephone Encounter (Signed)
 FYI Only or Action Required?: Action required by provider: update on patient condition.  Patient was last seen in primary care on 09/23/2023 by Norleen Lynwood ORN, MD.  Called Nurse Triage reporting Heart Problem.  Symptoms began yesterday.  Interventions attempted: Nothing.  Symptoms are: unchanged.  Triage Disposition: Home Care  Patient/caregiver understands and will follow disposition?: No, wishes to speak with PCP   Copied from CRM #[id number]. Topic: Clinical - Red Word Triage >> Mar 16, 2024  8:20 AM Eva FALCON wrote: Red Word that prompted transfer to Nurse Triage: NP Annitta Lea did a home visit and the patients heart rate was 110, asymptomatic, was taking a medication every day that is supposed to be once a week. NP is concerned and had her stop it for 3-4 weeks, just wanted to also follow up with a triage nurse and see what could be done next. Reason for Disposition  [1] Skipped or extra beat(s) AND [2] occurs < 4 times / minute    Routing to PCP office for review  Answer Assessment - Initial Assessment Questions 1. DESCRIPTION: Please describe your heart rate or heartbeat that you are having (e.g., fast/slow, regular/irregular, skipped or extra beats, palpitations)     110 2. ONSET: When did it start? (e.g., minutes, hours, days)      Yesterday afternoon 3. DURATION: How long does it last (e.g., seconds, minutes, hours)     na 4. PATTERN Does it come and go, or has it been constant since it started?  Does it get worse with exertion?   Are you feeling it now?     constant 5. TAP: Using your hand, can you tap out what you are feeling on a chair or table in front of you, so that I can hear? Note: Not all patients can do this.       na 6. HEART RATE: Can you tell me your heart rate? How many beats in 15 seconds?  Note: Not all patients can do this.       na 7. RECURRENT SYMPTOM: Have you ever had this before? If Yes, ask: When was the last time? and What  happened that time?      na 8. CAUSE: What do you think is causing the palpitations?     Unsure - possible overuse of medication 9. CARDIAC HISTORY: Do you have any history of heart disease? (e.g., heart attack, angina, bypass surgery, angioplasty, arrhythmia)      na 10. OTHER SYMPTOMS: Do you have any other symptoms? (e.g., dizziness, chest pain, sweating, difficulty breathing)       no 11. PREGNANCY: Is there any chance you are pregnant? When was your last menstrual period?       na  NP -homecare -called today to report heart rate of 110 from yesterday. No murmurs. Heartrate regular tachy without s/s.  Pt did tell NP in last couple of weeks she did have some heart palpitations.   BP 110/68 Pt was taking alendronate  (FOSAMAX ) 70 MG tablet daily instead of once per week.  NP wanted to see if someone has been checking on pt electrolytes since pt had the mix up on her medication.  NP did say pt is not currently having any s/s from elevated heart rate.  Nurse informed NP that information will be routed to PCP for review. Callback #  [phone]  Protocols used: Heart Rate and Heartbeat Questions-A-AH

## 2024-03-17 NOTE — Telephone Encounter (Signed)
 Ok to let pt know, that taking the fosamax  is not known to result in electrolyte problem of faster heart rate, unless there has been diarrhea, or perhaps nausea vomiting that even leads to mild dehydration.  Ok to continue to follow for any symptoms worsening for now if she is not having symptoms. But if there are GI side effects , she should work on increased oral fluids such as Gatorade to avoid dehydration.  Please consider an office visit for persistent elevated HR if not improving with this as well.    Thanks.

## 2024-03-17 NOTE — ED Provider Notes (Signed)
 [facility]   [id number] 03/16/24 Arrival Time: 1648  ASSESSMENT & PLAN:  1. Chest pain, unspecified type   2. Acute cough   3. Tachycardia   4. COPD exacerbation (HCC)    In no distress. I have personally viewed and independently interpreted the imaging studies ordered this visit. CXR: ques subtle LUL infiltrate; see radiology report; agree.  ECG: Performed today and interpreted by me: sinus tachycardia without ischemic changes. Review of chart show that she is always tachycardic around 120 or less.  Will tx for COPD exacerbation and empirically for early infiltrate. Begin: Meds ordered this encounter  Medications   HYDROcodone  bit-homatropine (HYCODAN) 5-1.5 MG/5ML syrup    Sig: Take 2.5-5 mLs by mouth every 6 (six) hours as needed for cough.    Dispense:  90 mL    Refill:  0   predniSONE  (DELTASONE ) 20 MG tablet    Sig: Take 2 tablets (40 mg total) by mouth daily.    Dispense:  10 tablet    Refill:  0   azithromycin  (ZITHROMAX ) 250 MG tablet    Sig: Take 1 tablet (250 mg total) by mouth daily. Take first 2 tablets together, then 1 every day until finished.    Dispense:  6 tablet    Refill:  0   Results for orders placed or performed during the hospital encounter of 03/16/24  POC Covid19/Flu A&B Antigen   Collection Time: 03/16/24  5:41 PM  Result Value Ref Range   Influenza A Antigen, POC Negative Negative   Influenza B Antigen, POC Negative Negative   Covid Antigen, POC Negative Negative    Chest pain precautions given.    Discharge Instructions      Be aware, your cough medication may cause drowsiness. Please do not drive, operate heavy machinery or make important decisions while on this medication, it can cloud your judgement.      Reviewed expectations re: course of current medical issues. Questions answered. Outlined signs and symptoms indicating need for more acute intervention. Patient verbalized understanding. After Visit Summary  given.   SUBJECTIVE:  History from: patient and family. Andrea [name] is a 73 y.o. female who presents with complaint of productive cough, SOB, wheeze, chest pain, weakness, and fatigue X 2 days. Daughter questions if she has been sick over past week. Just feel tired. Patient has taken Tylenol  last night. No known sick contacts. Denies current SOB./CP. No tx PTA.  Social History   Tobacco Use  Smoking Status Former   Current packs/day: 0.00   Average packs/day: 1 pack/day for 39.0 years (39.0 ttl pk-yrs)   Types: Cigarettes   Start date: 04/22/1968   Quit date: 04/23/2007   Years since quitting: 16.9  Smokeless Tobacco Never   Social History   Substance and Sexual Activity  Alcohol Use Yes   Comment: Occassionally.     OBJECTIVE:  Vitals:   03/16/24 1710  BP: 108/69  Pulse: (!) 124  Resp: 16  Temp: 99.6 F (37.6 C)  TempSrc: Oral  SpO2: 95%    General appearance: alert, oriented, no acute distress Eyes: PERRLA; EOMI; conjunctivae normal HENT: normocephalic; atraumatic Neck: supple with FROM Lungs: without labored respirations; speaks full sentences without difficulty; mild bilat exp wheezing Heart: regular; tachycardic Chest Wall: without tenderness to palpation Abdomen: soft, non-tender; no guarding or rebound tenderness Extremities: without edema; without calf swelling or tenderness; symmetrical without gross deformities Skin: warm and dry; without rash or lesions Neuro: normal gait Psychological: alert and cooperative;  normal mood and affect  Labs: Results for orders placed or performed during the hospital encounter of 03/16/24  POC Covid19/Flu A&B Antigen   Collection Time: 03/16/24  5:41 PM  Result Value Ref Range   Influenza A Antigen, POC Negative Negative   Influenza B Antigen, POC Negative Negative   Covid Antigen, POC Negative Negative     Imaging: DG Chest 2 View Result Date: 03/16/2024 EXAM: 2 VIEW(S) XRAY OF THE CHEST 03/16/2024  05:40:00 PM COMPARISON: Comparison with 04/28/2023. CLINICAL HISTORY: CP, cough, tachycardic. FINDINGS: LUNGS AND PLEURA: Emphysematous changes in the lungs. Slight fibrosis or linear atelectasis in both lung bases. Patchy infiltrates suggested in the left upper lung, new since prior study, possibly developing pneumonia. No pleural effusion. No pneumothorax. HEART AND MEDIASTINUM: No acute abnormality of the cardiac and mediastinal silhouettes. BONES AND SOFT TISSUES: Degenerative changes in the spine. Vascular calcifications. No acute osseous abnormality. IMPRESSION: 1. Patchy infiltrates in the left upper lung, new since prior study, possibly developing pneumonia. 2. Emphysematous changes in the lungs. 3. Slight fibrosis or linear atelectasis in both lung bases. Electronically signed by: Elsie Gravely MD 03/16/2024 06:00 PM EST RP Workstation: [id number]     Allergies  Allergen Reactions   Alendronate  Sodium Nausea Only    GI upset   Zyrtec  [Cetirizine ] Itching, Rash and Other (See Comments)    Tongue burning, thrush   Sertraline Hcl Other (See Comments)    unknown    Past Medical History:  Diagnosis Date   Allergic rhinitis, cause unspecified 08/07/2012   COPD 03/23/2008   Cough 11/05/2007   HYPERTENSION 02/10/2007   MUSCLE CRAMPS 08/09/2009   OSTEOPOROSIS 02/10/2007   Other and unspecified hyperlipidemia 08/07/2012   PEPTIC ULCER DISEASE 03/23/2008   PNEUMONIA, RIGHT UPPER LOBE 03/23/2008   RESTLESS LEG SYNDROME 03/23/2008   Unspecified disorder of thyroid  11/16/2007   Social History   Socioeconomic History   Marital status: Married    Spouse name: Quintin   Number of children: 2   Years of education: Not on file   Highest education level: Not on file  Occupational History   Occupation: RETIRED  Tobacco Use   Smoking status: Former    Current packs/day: 0.00    Average packs/day: 1 pack/day for 39.0 years (39.0 ttl pk-yrs)    Types: Cigarettes    Start date: 04/22/1968    Quit  date: 04/23/2007    Years since quitting: 16.9   Smokeless tobacco: Never  Vaping Use   Vaping status: Never Used  Substance and Sexual Activity   Alcohol use: Yes    Comment: Occassionally.   Drug use: No   Sexual activity: Not Currently  Other Topics Concern   Not on file  Social History Narrative   Lives with husband   Social Drivers of Health   Financial Resource Strain: Low Risk  (05/29/2023)   Overall Financial Resource Strain (CARDIA)    Difficulty of Paying Living Expenses: Not very hard  Food Insecurity: No Food Insecurity (05/29/2023)   Hunger Vital Sign    Worried About Running Out of Food in the Last Year: Never true    Ran Out of Food in the Last Year: Never true  Transportation Needs: No Transportation Needs (05/29/2023)   PRAPARE - Administrator, Civil Service (Medical): No    Lack of Transportation (Non-Medical): No  Physical Activity: Insufficiently Active (05/29/2023)   Exercise Vital Sign    Days of Exercise per Week: 2 days  Minutes of Exercise per Session: 30 min  Stress: Stress Concern Present (05/29/2023)   [employer] of Occupational Health - Occupational Stress Questionnaire    Feeling of Stress : Very much  Social Connections: Socially Integrated (05/29/2023)   Social Connection and Isolation Panel    Frequency of Communication with Friends and Family: More than three times a week    Frequency of Social Gatherings with Friends and Family: More than three times a week    Attends Religious Services: More than 4 times per year    Active Member of [employer] or Organizations: Yes    Attends Banker Meetings: Never    Marital Status: Married  Catering Manager Violence: Not At Risk (05/29/2023)   Humiliation, Afraid, Rape, and Kick questionnaire    Fear of Current or Ex-Partner: No    Emotionally Abused: No    Physically Abused: No    Sexually Abused: No   Family History  Problem Relation Age of Onset   Heart disease Mother     Leukemia Father    Breast cancer Daughter 75   Past Surgical History:  Procedure Laterality Date   Breast Biopsy negative  1972   BREAST EXCISIONAL BIOPSY Right       Rolinda Rogue, MD 03/17/24 [phone]

## 2024-03-29 NOTE — Telephone Encounter (Signed)
 Pt was seen in UC on 03/16/24.

## 2024-04-07 ENCOUNTER — Other Ambulatory Visit: Payer: Self-pay | Admitting: Internal Medicine

## 2024-04-07 ENCOUNTER — Other Ambulatory Visit: Payer: Self-pay

## 2024-04-07 DIAGNOSIS — E7800 Pure hypercholesterolemia, unspecified: Secondary | ICD-10-CM

## 2024-04-28 ENCOUNTER — Ambulatory Visit: Admitting: Internal Medicine

## 2024-04-28 ENCOUNTER — Encounter: Payer: Self-pay | Admitting: Internal Medicine

## 2024-04-28 VITALS — BP 128/80 | HR 123 | Temp 98.7°F | Ht 60.0 in | Wt 103.0 lb

## 2024-04-28 DIAGNOSIS — E559 Vitamin D deficiency, unspecified: Secondary | ICD-10-CM | POA: Diagnosis not present

## 2024-04-28 DIAGNOSIS — R058 Other specified cough: Secondary | ICD-10-CM

## 2024-04-28 DIAGNOSIS — J309 Allergic rhinitis, unspecified: Secondary | ICD-10-CM

## 2024-04-28 DIAGNOSIS — M545 Low back pain, unspecified: Secondary | ICD-10-CM | POA: Diagnosis not present

## 2024-04-28 MED ORDER — GABAPENTIN 100 MG PO CAPS: 100.0000 mg | ORAL_CAPSULE | Freq: Three times a day (TID) | ORAL | 5 refills | Status: AC

## 2024-04-28 MED ORDER — PROMETHAZINE-DM 6.25-15 MG/5ML PO SYRP: 5.0000 mL | ORAL_SOLUTION | Freq: Four times a day (QID) | ORAL | 0 refills | Status: AC | PRN

## 2024-04-28 MED ORDER — DOXYCYCLINE HYCLATE 100 MG PO TABS: 100.0000 mg | ORAL_TABLET | Freq: Two times a day (BID) | ORAL | 0 refills | Status: AC

## 2024-04-28 MED ORDER — FEXOFENADINE HCL 180 MG PO TABS: 180.0000 mg | ORAL_TABLET | Freq: Every day | ORAL | 1 refills | Status: AC

## 2024-04-28 NOTE — Assessment & Plan Note (Signed)
 Mild to mod, c/w  bronchitis vs pna, declines cxr,  for antibx course zpack, cough med prn,  to f/u any worsening symptoms or concerns

## 2024-04-28 NOTE — Progress Notes (Signed)
 Patient ID: Andrea [name], female   DOB: 07-02-1950, 74 y.o.   MRN: [id number]        Chief Complaint: follow up prod cough, allergies, chronic lbp with worsening bilateral leg pain       HPI:  Andrea [name] is a 74 y.o. female here mild ill - Here with acute onset mild to mod 2-3 days ST, HA, general weakness and malaise, with prod cough greenish sputum, but Pt denies chest pain, increased sob or doe, wheezing, orthopnea, PND, increased LE swelling, palpitations, dizziness or syncope.   Pt denies polydipsia, polyuria, or new focal neuro s/s.   Pt continues to have recurring LBP without change in severity, bowel or bladder change, fever, wt loss,  worsening LE numbness/weakness, gait change or falls but has worsening bilateral leg pain upper more than lower, gabapentin  100 at bedtime not working as well now.  Does have several wks ongoing nasal allergy symptoms with clearish congestion, itch and sneezing Wt Readings from Last 3 Encounters:  04/28/24 103 lb (46.7 kg)  09/23/23 103 lb (46.7 kg)  05/29/23 108 lb (49 kg)   BP Readings from Last 3 Encounters:  04/28/24 128/80  03/16/24 108/69  09/23/23 (!) 148/82         Past Medical History:  Diagnosis Date   Allergic rhinitis, cause unspecified 08/07/2012   COPD 03/23/2008   Cough 11/05/2007   HYPERTENSION 02/10/2007   MUSCLE CRAMPS 08/09/2009   OSTEOPOROSIS 02/10/2007   Other and unspecified hyperlipidemia 08/07/2012   PEPTIC ULCER DISEASE 03/23/2008   PNEUMONIA, RIGHT UPPER LOBE 03/23/2008   RESTLESS LEG SYNDROME 03/23/2008   Unspecified disorder of thyroid  11/16/2007   Past Surgical History:  Procedure Laterality Date   Breast Biopsy negative  1972   BREAST EXCISIONAL BIOPSY Right     reports that she quit smoking about 17 years ago. Her smoking use included cigarettes. She started smoking about 56 years ago. She has a 39 pack-year smoking history. She has never used smokeless tobacco. She reports current alcohol use. She reports that  she does not use drugs. family history includes Breast cancer (age of onset: 58) in her daughter; Heart disease in her mother; Leukemia in her father. Allergies[1] Medications Ordered Prior to Encounter[2]      ROS:  All others reviewed and negative.  Objective        PE:  BP 128/80 (BP Location: Right Arm, Patient Position: Sitting, Cuff Size: Normal)   Pulse (!) 123   Temp 98.7 F (37.1 C) (Oral)   Ht 5' (1.524 m)   Wt 103 lb (46.7 kg)   SpO2 96%   BMI 20.12 kg/m                 Constitutional: Pt appears mild ill               HENT: Head: NCAT.                Right Ear: External ear normal.                 Left Ear: External ear normal. Bilat tm's with mild erythema.  Max sinus areas non tender.  Pharynx with mild erythema, no exudate               Eyes: . Pupils are equal, round, and reactive to light. Conjunctivae and EOM are normal               Nose: without d/c or  deformity               Neck: Neck supple. Gross normal ROM               Cardiovascular: Normal rate and regular rhythm.                 Pulmonary/Chest: Effort normal and breath sounds without rales or wheezing.                Abd:  Soft, NT, ND, + BS, no organomegaly               Neurological: Pt is alert. At baseline orientation, motor grossly intact; spine nontender in midline               Skin: Skin is warm. No rashes, no other new lesions, LE edema - none               Psychiatric: Pt behavior is normal without agitation   Micro: none  Cardiac tracings I have personally interpreted today:  none  Pertinent Radiological findings (summarize): none   Lab Results  Component Value Date   WBC 4.2 04/28/2023   HGB 13.2 04/28/2023   HCT 39.6 04/28/2023   PLT 233.0 04/28/2023   GLUCOSE 87 04/28/2023   CHOL 214 (H) 04/28/2023   TRIG (H) 04/28/2023    495.0 Triglyceride is over 400; calculations on Lipids are invalid.   HDL 82.00 04/28/2023   LDLDIRECT 66.0 04/28/2023   LDLCALC 114 (H) 04/10/2020    ALT 19 04/28/2023   AST 48 (H) 04/28/2023   NA 143 04/28/2023   K 4.2 04/28/2023   CL 106 04/28/2023   CREATININE 0.94 04/28/2023   BUN 26 (H) 04/28/2023   CO2 27 04/28/2023   TSH 1.07 04/28/2023   INR 1.00 02/11/2014   HGBA1C 5.3 04/28/2023   Assessment/Plan:  Andrea [name] is a 74 y.o. Black or African American [2] female with  has a past medical history of Allergic rhinitis, cause unspecified (08/07/2012), COPD (03/23/2008), Cough (11/05/2007), HYPERTENSION (02/10/2007), MUSCLE CRAMPS (08/09/2009), OSTEOPOROSIS (02/10/2007), Other and unspecified hyperlipidemia (08/07/2012), PEPTIC ULCER DISEASE (03/23/2008), PNEUMONIA, RIGHT UPPER LOBE (03/23/2008), RESTLESS LEG SYNDROME (03/23/2008), and Unspecified disorder of thyroid  (11/16/2007).  Productive cough Mild to mod, c/w bronchitis vs pna, declines cxr, for antibx course zpack, cough med prn,  to f/u any worsening symptoms or concerns  Allergic rhinitis Mild to mod, for allegra  180 mg prn,  to f/u any worsening symptoms or concerns  Low back pain With worsening LE pain, for gabapentin  100 mg tid  Vitamin D  deficiency Last vitamin D  Lab Results  Component Value Date   VD25OH 51.51 04/28/2023   Stable, cont oral replacement  Followup: Return if symptoms worsen or fail to improve.  Lynwood Rush, MD 04/28/2024 7:37 PM [facility]     [1]  Allergies Allergen Reactions   Alendronate  Sodium Nausea Only    GI upset   Zyrtec  [Cetirizine ] Itching, Rash and Other (See Comments)    Tongue burning, thrush   Sertraline Hcl Other (See Comments)    unknown  [2]  Current Outpatient Medications on File Prior to Visit  Medication Sig Dispense Refill   albuterol  (VENTOLIN  HFA) 108 (90 Base) MCG/ACT inhaler Inhale 2 puffs into the lungs every 6 (six) hours as needed for wheezing or shortness of breath. 36 g 3   alendronate  (FOSAMAX ) 70 MG tablet Take 1 tablet (  70 mg total) by  mouth every 7 (seven) days. Take with a full glass of water on an empty stomach. 12 tablet 3   allopurinol  (ZYLOPRIM ) 300 MG tablet TAKE 1 TABLET BY MOUTH EVERY DAY 90 tablet 3   ALPRAZolam  (XANAX ) 0.25 MG tablet TAKE 1 TABLET(0.25 MG) BY MOUTH DAILY 90 tablet 2   aspirin  EC 81 MG tablet Take 1 tablet (81 mg total) by mouth daily. 30 tablet 5   atorvastatin  (LIPITOR) 20 MG tablet TAKE 1 TABLET BY MOUTH EVERY DAY 90 tablet 2   azithromycin  (ZITHROMAX ) 250 MG tablet Take 1 tablet (250 mg total) by mouth daily. Take first 2 tablets together, then 1 every day until finished. 6 tablet 0   Cholecalciferol (THERA-D 2000) 50 MCG (2000 UT) TABS 1 tab by mouth once daily 90 tablet 99   [employer] VITAMIN B12 1000 MCG tablet TAKE 1 TABLET BY MOUTH EVERY DAY 90 tablet 1   fluticasone -salmeterol (ADVAIR ) 250-50 MCG/ACT AEPB Inhale 1 puff into the lungs in the morning and at bedtime. 180 each 3   HYDROcodone  bit-homatropine (HYCODAN) 5-1.5 MG/5ML syrup Take 2.5-5 mLs by mouth every 6 (six) hours as needed for cough. 90 mL 0   predniSONE  (DELTASONE ) 20 MG tablet Take 2 tablets (40 mg total) by mouth daily. 10 tablet 0   triamterene -hydrochlorothiazide (MAXZIDE-25) 37.5-25 MG tablet Take 1 tablet by mouth daily. 90 tablet 3   No current facility-administered medications on file prior to visit.

## 2024-04-28 NOTE — Assessment & Plan Note (Signed)
 Last vitamin D  Lab Results  Component Value Date   VD25OH 51.51 04/28/2023   Stable, cont oral replacement

## 2024-04-28 NOTE — Patient Instructions (Signed)
 Please take all new medication as prescribed - the antibiotic, cough medicine, allegra  for allergies, and gabapentin  for the leg pain  Please continue all other medications as before, and refills have been done if requested.  Please have the pharmacy call with any other refills you may need.  Please continue your efforts at being more active, low cholesterol diet, and weight control.  Please keep your appointments with your specialists as you may have planned

## 2024-04-28 NOTE — Assessment & Plan Note (Signed)
 With worsening LE pain, for gabapentin  100 mg tid

## 2024-04-28 NOTE — Assessment & Plan Note (Signed)
 Mild to mod, for allegra  180 mg prn,  to f/u any worsening symptoms or concerns

## 2024-05-01 ENCOUNTER — Other Ambulatory Visit: Payer: Self-pay | Admitting: Internal Medicine

## 2024-06-07 ENCOUNTER — Ambulatory Visit: Payer: Medicare Other

## 2024-06-08 ENCOUNTER — Ambulatory Visit

## 2024-07-15 ENCOUNTER — Encounter: Admitting: Nurse Practitioner
